# Patient Record
Sex: Female | Born: 1970 | Race: Black or African American | Hispanic: No | Marital: Married | Smoking: Never smoker
Health system: Southern US, Community
[De-identification: ages and names within clinical notes are randomized; demographics above are authoritative.]

## PROBLEM LIST (undated history)

## (undated) ENCOUNTER — Ambulatory Visit (HOSPITAL_COMMUNITY): Admission: EM | Disposition: A | Discharge status: Other Acute Inpatient Hospital | Payer: BC Managed Care – PPO

## (undated) DIAGNOSIS — K589 Irritable bowel syndrome without diarrhea: Secondary | ICD-10-CM

## (undated) DIAGNOSIS — F32A Depression, unspecified: Secondary | ICD-10-CM

## (undated) DIAGNOSIS — F329 Major depressive disorder, single episode, unspecified: Secondary | ICD-10-CM

## (undated) DIAGNOSIS — K760 Fatty (change of) liver, not elsewhere classified: Secondary | ICD-10-CM

## (undated) DIAGNOSIS — K469 Unspecified abdominal hernia without obstruction or gangrene: Secondary | ICD-10-CM

## (undated) DIAGNOSIS — M1990 Unspecified osteoarthritis, unspecified site: Secondary | ICD-10-CM

## (undated) DIAGNOSIS — E7800 Pure hypercholesterolemia, unspecified: Secondary | ICD-10-CM

## (undated) HISTORY — PX: CHOLECYSTECTOMY: SHX55

---

## 2003-10-24 ENCOUNTER — Emergency Department (HOSPITAL_COMMUNITY): Admission: EM | Admit: 2003-10-24 | Discharge: 2003-10-24 | Payer: Self-pay | Admitting: Emergency Medicine

## 2004-12-09 ENCOUNTER — Emergency Department (HOSPITAL_COMMUNITY): Admission: EM | Admit: 2004-12-09 | Discharge: 2004-12-09 | Payer: Self-pay | Admitting: Emergency Medicine

## 2006-08-19 ENCOUNTER — Emergency Department (HOSPITAL_COMMUNITY): Admission: EM | Admit: 2006-08-19 | Discharge: 2006-08-19 | Payer: Self-pay | Admitting: Emergency Medicine

## 2006-08-31 ENCOUNTER — Emergency Department (HOSPITAL_COMMUNITY): Admission: EM | Admit: 2006-08-31 | Discharge: 2006-08-31 | Payer: Self-pay | Admitting: Emergency Medicine

## 2006-09-06 ENCOUNTER — Emergency Department (HOSPITAL_COMMUNITY): Admission: EM | Admit: 2006-09-06 | Discharge: 2006-09-07 | Payer: Self-pay | Admitting: Emergency Medicine

## 2006-09-21 ENCOUNTER — Emergency Department (HOSPITAL_COMMUNITY): Admission: EM | Admit: 2006-09-21 | Discharge: 2006-09-21 | Payer: Self-pay | Admitting: Emergency Medicine

## 2006-11-22 ENCOUNTER — Emergency Department (HOSPITAL_COMMUNITY): Admission: EM | Admit: 2006-11-22 | Discharge: 2006-11-22 | Payer: Self-pay | Admitting: Emergency Medicine

## 2006-11-29 ENCOUNTER — Emergency Department (HOSPITAL_COMMUNITY): Admission: EM | Admit: 2006-11-29 | Discharge: 2006-11-29 | Payer: Self-pay | Admitting: Emergency Medicine

## 2006-12-20 ENCOUNTER — Emergency Department (HOSPITAL_COMMUNITY): Admission: EM | Admit: 2006-12-20 | Discharge: 2006-12-20 | Payer: Self-pay | Admitting: Emergency Medicine

## 2006-12-28 ENCOUNTER — Emergency Department (HOSPITAL_COMMUNITY): Admission: EM | Admit: 2006-12-28 | Discharge: 2006-12-28 | Payer: Self-pay | Admitting: Emergency Medicine

## 2006-12-30 ENCOUNTER — Ambulatory Visit (HOSPITAL_COMMUNITY): Admission: RE | Admit: 2006-12-30 | Discharge: 2006-12-30 | Payer: Self-pay | Admitting: Obstetrics

## 2007-03-07 ENCOUNTER — Emergency Department (HOSPITAL_COMMUNITY): Admission: EM | Admit: 2007-03-07 | Discharge: 2007-03-07 | Payer: Self-pay | Admitting: *Deleted

## 2007-03-20 ENCOUNTER — Emergency Department (HOSPITAL_COMMUNITY): Admission: EM | Admit: 2007-03-20 | Discharge: 2007-03-20 | Payer: Self-pay | Admitting: Emergency Medicine

## 2007-04-21 ENCOUNTER — Emergency Department (HOSPITAL_COMMUNITY): Admission: EM | Admit: 2007-04-21 | Discharge: 2007-04-21 | Payer: Self-pay | Admitting: Emergency Medicine

## 2007-05-13 ENCOUNTER — Emergency Department (HOSPITAL_COMMUNITY): Admission: EM | Admit: 2007-05-13 | Discharge: 2007-05-13 | Payer: Self-pay | Admitting: Emergency Medicine

## 2009-09-19 ENCOUNTER — Emergency Department (HOSPITAL_COMMUNITY): Admission: EM | Admit: 2009-09-19 | Discharge: 2009-09-19 | Payer: Self-pay | Admitting: Emergency Medicine

## 2010-07-14 ENCOUNTER — Emergency Department (HOSPITAL_COMMUNITY): Admission: EM | Admit: 2010-07-14 | Discharge: 2010-07-14 | Payer: Self-pay | Admitting: Emergency Medicine

## 2011-01-06 ENCOUNTER — Emergency Department (HOSPITAL_COMMUNITY)
Admission: EM | Admit: 2011-01-06 | Discharge: 2011-01-06 | Payer: Self-pay | Source: Home / Self Care | Admitting: Emergency Medicine

## 2011-01-07 LAB — URINALYSIS, ROUTINE W REFLEX MICROSCOPIC
Bilirubin Urine: NEGATIVE
Ketones, ur: NEGATIVE mg/dL
Leukocytes, UA: NEGATIVE
Specific Gravity, Urine: 1.014 (ref 1.005–1.030)
Urobilinogen, UA: 0.2 mg/dL (ref 0.0–1.0)
pH: 6.5 (ref 5.0–8.0)

## 2011-01-07 LAB — POCT I-STAT, CHEM 8
BUN: 15 mg/dL (ref 6–23)
Calcium, Ion: 1.23 mmol/L (ref 1.12–1.32)
Creatinine, Ser: 1.1 mg/dL (ref 0.4–1.2)
Glucose, Bld: 169 mg/dL — ABNORMAL HIGH (ref 70–99)
Hemoglobin: 13.9 g/dL (ref 12.0–15.0)
Potassium: 4.5 mEq/L (ref 3.5–5.1)
Sodium: 139 mEq/L (ref 135–145)

## 2011-01-07 LAB — URINE MICROSCOPIC-ADD ON

## 2011-01-27 ENCOUNTER — Ambulatory Visit: Payer: 59 | Attending: Internal Medicine

## 2011-02-03 ENCOUNTER — Emergency Department (HOSPITAL_COMMUNITY)
Admission: EM | Admit: 2011-02-03 | Discharge: 2011-02-04 | Disposition: A | Discharge status: Home / Self Care | Payer: 59 | Attending: Emergency Medicine | Admitting: Emergency Medicine

## 2011-02-03 DIAGNOSIS — R109 Unspecified abdominal pain: Secondary | ICD-10-CM | POA: Insufficient documentation

## 2011-02-03 DIAGNOSIS — E119 Type 2 diabetes mellitus without complications: Secondary | ICD-10-CM | POA: Insufficient documentation

## 2011-02-03 DIAGNOSIS — K5900 Constipation, unspecified: Secondary | ICD-10-CM | POA: Insufficient documentation

## 2011-02-04 ENCOUNTER — Emergency Department (HOSPITAL_COMMUNITY): Payer: 59

## 2011-02-27 LAB — GLUCOSE, CAPILLARY: Glucose-Capillary: 189 mg/dL — ABNORMAL HIGH (ref 70–99)

## 2011-05-24 ENCOUNTER — Emergency Department (HOSPITAL_COMMUNITY)
Admission: EM | Admit: 2011-05-24 | Discharge: 2011-05-24 | Disposition: A | Discharge status: Home / Self Care | Payer: 59 | Attending: Emergency Medicine | Admitting: Emergency Medicine

## 2011-05-24 DIAGNOSIS — R51 Headache: Secondary | ICD-10-CM | POA: Insufficient documentation

## 2011-05-24 DIAGNOSIS — J3489 Other specified disorders of nose and nasal sinuses: Secondary | ICD-10-CM | POA: Insufficient documentation

## 2011-05-24 DIAGNOSIS — E119 Type 2 diabetes mellitus without complications: Secondary | ICD-10-CM | POA: Insufficient documentation

## 2011-05-24 DIAGNOSIS — H9209 Otalgia, unspecified ear: Secondary | ICD-10-CM | POA: Insufficient documentation

## 2011-05-24 DIAGNOSIS — H6690 Otitis media, unspecified, unspecified ear: Secondary | ICD-10-CM | POA: Insufficient documentation

## 2011-05-24 DIAGNOSIS — R070 Pain in throat: Secondary | ICD-10-CM | POA: Insufficient documentation

## 2011-10-26 ENCOUNTER — Encounter: Payer: Self-pay | Admitting: *Deleted

## 2011-10-26 ENCOUNTER — Other Ambulatory Visit: Payer: Self-pay

## 2011-10-26 ENCOUNTER — Emergency Department (HOSPITAL_COMMUNITY)
Admission: EM | Admit: 2011-10-26 | Discharge: 2011-10-26 | Disposition: A | Discharge status: Home / Self Care | Payer: 59 | Attending: Emergency Medicine | Admitting: Emergency Medicine

## 2011-10-26 ENCOUNTER — Emergency Department (HOSPITAL_COMMUNITY): Payer: 59

## 2011-10-26 DIAGNOSIS — R079 Chest pain, unspecified: Secondary | ICD-10-CM | POA: Insufficient documentation

## 2011-10-26 DIAGNOSIS — R1110 Vomiting, unspecified: Secondary | ICD-10-CM | POA: Insufficient documentation

## 2011-10-26 DIAGNOSIS — E119 Type 2 diabetes mellitus without complications: Secondary | ICD-10-CM | POA: Insufficient documentation

## 2011-10-26 LAB — CBC
HCT: 36.4 % (ref 36.0–46.0)
MCH: 28.2 pg (ref 26.0–34.0)
MCHC: 33 g/dL (ref 30.0–36.0)
Platelets: 317 10*3/uL (ref 150–400)
RBC: 4.26 MIL/uL (ref 3.87–5.11)
RDW: 12.3 % (ref 11.5–15.5)
WBC: 6.8 10*3/uL (ref 4.0–10.5)

## 2011-10-26 LAB — PROTIME-INR
INR: 0.95 (ref 0.00–1.49)
Prothrombin Time: 12.9 seconds (ref 11.6–15.2)

## 2011-10-26 LAB — POCT I-STAT, CHEM 8
Calcium, Ion: 1.19 mmol/L (ref 1.12–1.32)
Chloride: 103 mEq/L (ref 96–112)
Creatinine, Ser: 0.7 mg/dL (ref 0.50–1.10)
HCT: 38 % (ref 36.0–46.0)
Hemoglobin: 12.9 g/dL (ref 12.0–15.0)
Potassium: 4.1 mEq/L (ref 3.5–5.1)
TCO2: 25 mmol/L (ref 0–100)

## 2011-10-26 LAB — APTT: aPTT: 24 seconds (ref 24–37)

## 2011-10-26 LAB — POCT I-STAT TROPONIN I: Troponin i, poc: 0 ng/mL (ref 0.00–0.08)

## 2011-10-26 MED ORDER — ASPIRIN 81 MG PO CHEW
324.0000 mg | CHEWABLE_TABLET | Freq: Once | ORAL | Status: AC
  Administered 2011-10-26: 324 mg via ORAL
  Filled 2011-10-26: qty 4

## 2011-10-26 NOTE — ED Provider Notes (Signed)
Medical screening examination/treatment/procedure(s) were performed by non-physician practitioner and as supervising physician I was immediately available for consultation/collaboration.   Suad Autrey A. Jacquelyne Quarry, MD 10/26/11 1007 

## 2011-10-26 NOTE — ED Provider Notes (Signed)
History     CSN: [id number] Arrival date & time: 10/26/2011  6:06 AM   First MD Initiated Contact with Patient 10/26/11 [phone]      Chief Complaint  Patient presents with  . Chest Pain    (Consider location/radiation/quality/duration/timing/severity/associated sxs/prior treatment) Patient is a 40 y.o. female presenting with chest pain. The history is provided by the patient.  Chest Pain The chest pain began 2 days ago. Episode Length: The discomfort is constant with waxing/waning fluxuations starting last night. The chest pain is unchanged. The quality of the pain is described as aching. The pain does not radiate. Primary symptoms include vomiting. Pertinent negatives for primary symptoms include no fever and no shortness of breath.  Pertinent negatives for associated symptoms include no lower extremity edema. She tried nothing for the symptoms. Past medical history comments: Patient reports diet controlled diabetes.  Her family medical history is significant for stroke in family.  Pertinent negatives for family medical history include: no CAD in family and no early MI in family.     Past Medical History  Diagnosis Date  . Diabetes mellitus     Past Surgical History  Procedure Date  . Cesarean section   . Cholecystectomy     Family History  Problem Relation Age of Onset  . Hypertension Mother   . Heart failure Mother     History  Substance Use Topics  . Smoking status: Never Smoker   . Smokeless tobacco: Not on file  . Alcohol Use: No    OB History    Grav Para Term Preterm Abortions TAB SAB Ect Mult Living                  Review of Systems  Constitutional: Negative for fever and chills.  HENT: Negative.   Respiratory: Negative.  Negative for shortness of breath.   Cardiovascular: Positive for chest pain.  Gastrointestinal: Positive for vomiting.  Musculoskeletal: Negative.   Skin: Negative.   Neurological: Negative.     Allergies  Penicillins  Home  Medications   Current Outpatient Rx  Name Route Sig Dispense Refill  . IBUPROFEN 200 MG PO TABS Oral Take 800 mg by mouth every 8 (eight) hours as needed. For pain       BP 120/80  Pulse 75  Temp(Src) 97.8 F (36.6 C) (Oral)  Resp 18  SpO2 96%  LMP 10/17/2011  Physical Exam  Constitutional: She appears well-developed and well-nourished.  HENT:  Head: Normocephalic.  Neck: Normal range of motion. Neck supple.  Cardiovascular: Normal rate and regular rhythm.   Pulmonary/Chest: Effort normal and breath sounds normal.  Abdominal: Soft. Bowel sounds are normal. There is no tenderness. There is no rebound and no guarding.  Musculoskeletal: Normal range of motion. She exhibits no edema.  Neurological: She is alert. No cranial nerve deficit.  Skin: Skin is warm and dry. No rash noted.  Psychiatric: She has a normal mood and affect.    ED Course  Procedures (including critical care time)  Labs Reviewed  POCT I-STAT, CHEM 8 - Abnormal; Notable for the following:    Glucose, Bld 161 (*)    All other components within normal limits  CBC  PROTIME-INR  APTT  POCT I-STAT TROPONIN I  I-STAT, CHEM 8  I-STAT TROPONIN I   Dg Chest 2 View  10/26/2011  *RADIOLOGY REPORT*  Clinical Data: Chest pain.  CHEST - 2 VIEW  Comparison: PA and lateral chest 12/09/2004.  Findings: Lungs are clear.  Lung  volumes are low.  No pneumothorax or pleural effusion.  Heart size normal.  IMPRESSION: No acute disease.  Original Report Authenticated By: Bernadene Bell. Maricela Curet, M.D.     No diagnosis found.    MDM   Date: 10/26/2011  Rate: 84  Rhythm: normal sinus rhythm  QRS Axis: normal  Intervals: normal  ST/T Wave abnormalities: normal  Conduction Disutrbances:none  Narrative Interpretation:   Old EKG Reviewed: none available          Rodena Medin, PA 10/26/11 [phone]

## 2011-10-26 NOTE — ED Provider Notes (Signed)
History     CSN: [id number] Arrival date & time: 10/26/2011  6:06 AM   First MD Initiated Contact with Patient 10/26/11 [phone]      Chief Complaint  Patient presents with  . Chest Pain    (Consider location/radiation/quality/duration/timing/severity/associated sxs/prior treatment) HPI  Past Medical History  Diagnosis Date  . Diabetes mellitus     Past Surgical History  Procedure Date  . Cesarean section   . Cholecystectomy     Family History  Problem Relation Age of Onset  . Hypertension Mother   . Heart failure Mother     History  Substance Use Topics  . Smoking status: Never Smoker   . Smokeless tobacco: Not on file  . Alcohol Use: No    OB History    Grav Para Term Preterm Abortions TAB SAB Ect Mult Living                  Review of Systems  Allergies  Penicillins  Home Medications   Current Outpatient Rx  Name Route Sig Dispense Refill  . IBUPROFEN 200 MG PO TABS Oral Take 800 mg by mouth every 8 (eight) hours as needed. For pain       BP 120/80  Pulse 75  Temp(Src) 97.8 F (36.6 C) (Oral)  Resp 18  SpO2 96%  LMP 10/17/2011  Physical Exam  ED Course  Procedures (including critical care time)  Labs Reviewed  POCT I-STAT, CHEM 8 - Abnormal; Notable for the following:    Glucose, Bld 161 (*)    All other components within normal limits  CBC  PROTIME-INR  APTT  POCT I-STAT TROPONIN I  I-STAT, CHEM 8  I-STAT TROPONIN I   Dg Chest 2 View  10/26/2011  *RADIOLOGY REPORT*  Clinical Data: Chest pain.  CHEST - 2 VIEW  Comparison: PA and lateral chest 12/09/2004.  Findings: Lungs are clear.  Lung volumes are low.  No pneumothorax or pleural effusion.  Heart size normal.  IMPRESSION: No acute disease.  Original Report Authenticated By: Bernadene Bell. D'ALESSIO, M.D.     1. Chest pain       MDM  Medical screening examination/treatment/procedure(s) were performed by non-physician practitioner and as supervising physician I was immediately  available for consultation/collaboration.         Tomeka Kantner A. Patrica Duel, MD 10/26/11 [phone]

## 2011-10-26 NOTE — ED Notes (Signed)
Woke around 0330, went to b/r, felt light headed, L neck felt tight also CP, develoed nv around 1 hr ago.

## 2012-05-23 ENCOUNTER — Other Ambulatory Visit: Payer: Self-pay

## 2012-05-23 ENCOUNTER — Encounter (HOSPITAL_COMMUNITY): Payer: Self-pay | Admitting: *Deleted

## 2012-05-23 ENCOUNTER — Emergency Department (HOSPITAL_COMMUNITY): Payer: 59

## 2012-05-23 ENCOUNTER — Emergency Department (HOSPITAL_COMMUNITY)
Admission: EM | Admit: 2012-05-23 | Discharge: 2012-05-23 | Discharge status: Absent without leave | Payer: 59 | Source: Home / Self Care

## 2012-05-23 ENCOUNTER — Emergency Department (HOSPITAL_COMMUNITY)
Admission: EM | Admit: 2012-05-23 | Discharge: 2012-05-23 | Disposition: A | Discharge status: Home / Self Care | Payer: 59 | Attending: Emergency Medicine | Admitting: Emergency Medicine

## 2012-05-23 DIAGNOSIS — R079 Chest pain, unspecified: Secondary | ICD-10-CM | POA: Insufficient documentation

## 2012-05-23 DIAGNOSIS — R110 Nausea: Secondary | ICD-10-CM | POA: Insufficient documentation

## 2012-05-23 DIAGNOSIS — E119 Type 2 diabetes mellitus without complications: Secondary | ICD-10-CM | POA: Insufficient documentation

## 2012-05-23 DIAGNOSIS — F3289 Other specified depressive episodes: Secondary | ICD-10-CM | POA: Insufficient documentation

## 2012-05-23 DIAGNOSIS — F329 Major depressive disorder, single episode, unspecified: Secondary | ICD-10-CM | POA: Insufficient documentation

## 2012-05-23 DIAGNOSIS — R55 Syncope and collapse: Secondary | ICD-10-CM | POA: Insufficient documentation

## 2012-05-23 HISTORY — DX: Depression, unspecified: F32.A

## 2012-05-23 HISTORY — DX: Major depressive disorder, single episode, unspecified: F32.9

## 2012-05-23 LAB — CBC
MCV: 85.2 fL (ref 78.0–100.0)
Platelets: 341 10*3/uL (ref 150–400)
RBC: 4.8 MIL/uL (ref 3.87–5.11)
RDW: 12.7 % (ref 11.5–15.5)
WBC: 7.9 10*3/uL (ref 4.0–10.5)

## 2012-05-23 LAB — BASIC METABOLIC PANEL
CO2: 22 mEq/L (ref 19–32)
Calcium: 9.9 mg/dL (ref 8.4–10.5)
GFR calc non Af Amer: >90 mL/min (ref >90)
Glucose, Bld: 160 mg/dL — ABNORMAL HIGH (ref 70–99)
Potassium: 4 mEq/L (ref 3.5–5.1)
Sodium: 135 mEq/L (ref 135–145)

## 2012-05-23 LAB — DIFFERENTIAL
Basophils Absolute: 0 10*3/uL (ref 0.0–0.1)
Eosinophils Relative: 3 % (ref 0–5)
Lymphocytes Relative: 26 % (ref 12–46)
Lymphs Abs: 2.1 10*3/uL (ref 0.7–4.0)
Neutro Abs: 5.1 10*3/uL (ref 1.7–7.7)
Neutrophils Relative %: 64 % (ref 43–77)

## 2012-05-23 LAB — URINALYSIS, ROUTINE W REFLEX MICROSCOPIC
Glucose, UA: NEGATIVE mg/dL
Leukocytes, UA: NEGATIVE
Protein, ur: NEGATIVE mg/dL
Specific Gravity, Urine: 1.026 (ref 1.005–1.030)
Urobilinogen, UA: 0.2 mg/dL (ref 0.0–1.0)

## 2012-05-23 LAB — POCT I-STAT TROPONIN I: Troponin i, poc: 0 ng/mL (ref 0.00–0.08)

## 2012-05-23 LAB — URINE MICROSCOPIC-ADD ON

## 2012-05-23 LAB — D-DIMER, QUANTITATIVE: D-Dimer, Quant: <0.22 ug/mL-FEU (ref 0.00–0.48)

## 2012-05-23 MED ORDER — ASPIRIN 81 MG PO CHEW
324.0000 mg | CHEWABLE_TABLET | Freq: Once | ORAL | Status: AC
  Administered 2012-05-23: 324 mg via ORAL
  Filled 2012-05-23: qty 4

## 2012-05-23 NOTE — ED Notes (Signed)
Notified nurse of blood pressure

## 2012-05-23 NOTE — ED Notes (Signed)
Pt stated that she did not have to urinate at this time. 

## 2012-05-23 NOTE — Discharge Instructions (Signed)
Ms McCanies-Deshotels the EKG and labs for your heart show that you did not have a heart attack.  The chest x-ray and and D dimer show no pneumonia or blood clot.  We also did a cbc and cmet that shows your blood HGB and electrolytes are normal.  Follow up with Dr. Valentina Lucks tomorrow.  Return to the ER for severe chest pain, SOB or other concerns. Take an aspirin a day and zantac or pepcid to prevent this from coming back.

## 2012-05-23 NOTE — ED Provider Notes (Signed)
History     CSN: [id number]  Arrival date & time 05/23/12  1214   First MD Initiated Contact with Patient 05/23/12 1317      Chief Complaint  Patient presents with  . Chest Pain    (Consider location/radiation/quality/duration/timing/severity/associated sxs/prior treatment) Patient is a 41 y.o. female presenting with chest pain. The history is provided by the patient and the spouse. No language interpreter was used.  Chest Pain The chest pain began 12 - 24 hours ago. Duration of episode(s) is 2 minutes. Chest pain occurs intermittently. At its most intense, the pain is at 7/10. The pain is currently at 4/10. The severity of the pain is mild. The quality of the pain is described as pressure-like and heavy. The pain does not radiate. Primary symptoms include nausea. Pertinent negatives for primary symptoms include no fever, no fatigue, no syncope, no shortness of breath, no cough, no wheezing, no abdominal pain, no vomiting and no dizziness.  Associated symptoms include near-syncope.  Pertinent negatives for associated symptoms include no diaphoresis, no lower extremity edema, no numbness and no weakness. She tried nothing for the symptoms. Risk factors include lack of exercise, obesity and stress.  Her past medical history is significant for anxiety/panic attacks and thyroid problem.  Pertinent negatives for past medical history include no aneurysm, no aortic aneurysm, no aortic dissection, no arrhythmia, no cancer, no COPD, no CHF, no diabetes, no DVT, no hyperlipidemia, no hypertension, no MI, no mitral valve prolapse, no PE, no seizures, no sleep apnea and no strokes.  Her family medical history is significant for CAD in family, diabetes in family, heart disease in family, hyperlipidemia in family, hypertension in family, early MI in family, PE in family and PVD in family.  Pertinent negatives for family medical history include: no sickle cell disease in family, no stroke in family and no  sudden death in family.  Procedure history is positive for exercise treadmill test. Procedure history comments: at [facility] cardiology.   41yo female c/o chest tightness x 5 that started around 12 hours ago that lasted minutes at a time. Sharp fleeting L chest pain x 1 yesterday.   Also felt like she was going to pass out "a jolt feeling".  States that she had an episode of diarrhea nausea as well. States that her fingers and toes itch intermittantly.  Denies SOB, fever, diaphoresis, calf pain or long trip.  States that the tightness was worse laying on her side.  Was here a year ago for the same.  Negative stress test at [facility] cardiology.     Past Medical History  Diagnosis Date  . Diabetes mellitus   . Depression     Past Surgical History  Procedure Date  . Cesarean section   . Cholecystectomy     Family History  Problem Relation Age of Onset  . Hypertension Mother   . Heart failure Mother     History  Substance Use Topics  . Smoking status: Never Smoker   . Smokeless tobacco: Not on file  . Alcohol Use: No    OB History    Grav Para Term Preterm Abortions TAB SAB Ect Mult Living                  Review of Systems  Constitutional: Negative for fever, diaphoresis and fatigue.  Respiratory: Negative for cough, shortness of breath and wheezing.   Cardiovascular: Positive for chest pain and near-syncope. Negative for syncope.  Gastrointestinal: Positive for nausea. Negative for vomiting  and abdominal pain.  Neurological: Negative for dizziness, seizures, weakness and numbness.    Allergies  Penicillins  Home Medications   Current Outpatient Rx  Name Route Sig Dispense Refill  . IBUPROFEN 200 MG PO TABS Oral Take 400-800 mg by mouth every 8 (eight) hours as needed. For pain      BP 127/73  Pulse 97  Temp(Src) 98.1 F (36.7 C) (Oral)  Resp 16  Wt 315 lb (142.883 kg)  SpO2 97%  LMP 05/02/2012  Physical Exam  Nursing note and vitals reviewed. Constitutional:  She is oriented to person, place, and time. She appears well-developed and well-nourished.       Morbid obese  HENT:  Head: Normocephalic and atraumatic.  Eyes: Conjunctivae and EOM are normal. Pupils are equal, round, and reactive to light.  Neck: Normal range of motion. Neck supple.  Cardiovascular: Normal rate.   Pulmonary/Chest: Effort normal and breath sounds normal. No respiratory distress. She has no wheezes.  Abdominal: Soft. Bowel sounds are normal. She exhibits no distension. There is no tenderness. There is no rebound and no guarding.  Musculoskeletal: Normal range of motion. She exhibits no edema and no tenderness.  Neurological: She is alert and oriented to person, place, and time. She has normal reflexes.  Skin: Skin is warm and dry.  Psychiatric: She has a normal mood and affect.    ED Course  Procedures (including critical care time)   Labs Reviewed  BASIC METABOLIC PANEL  URINALYSIS, ROUTINE W REFLEX MICROSCOPIC   No results found.   No diagnosis found.    MDM   Date: 05/23/2012  Rate: 95  Rhythm: normal sinus rhythm  QRS Axis: normal  Intervals: normal  ST/T Wave abnormalities: normal  Conduction Disutrbances:none  Narrative Interpretation:   Old EKG Reviewed: none available  Chest tightness with multiple other complaints.  Negative stress test with [facility] cardiology last year.  Pain free in the ER.  Feels better wants to go home.  EKG unremarkable.  Troponin negative.  D dimer negative.  CBC and B met unremarkable.  Chest x-ray unremarkable.  Will follow up [facility] cardiology this week.  Return if worse.  Blood in urine.   Labs Reviewed  BASIC METABOLIC PANEL - Abnormal; Notable for the following:    Glucose, Bld 160 (*)    All other components within normal limits  URINALYSIS, ROUTINE W REFLEX MICROSCOPIC - Abnormal; Notable for the following:    Hgb urine dipstick SMALL (*)    All other components within normal limits  URINE MICROSCOPIC-ADD ON  - Abnormal; Notable for the following:    Squamous Epithelial / LPF FEW (*)    Bacteria, UA FEW (*)    All other components within normal limits  CBC  DIFFERENTIAL  D-DIMER, QUANTITATIVE  POCT I-STAT TROPONIN I  LAB REPORT - SCANNED          Remi Haggard, NP 05/24/12 1113

## 2012-05-23 NOTE — ED Notes (Signed)
MD at bedside when ekg was attempted

## 2012-05-23 NOTE — ED Notes (Addendum)
Pt states "it started when I was in the bed last night, it felt like a jolt and like I was getting ready to black out, I felt nauseated, had diarrhea, felt a fluttery/quivery feeling"

## 2012-05-24 NOTE — ED Provider Notes (Signed)
Medical screening examination/treatment/procedure(s) were performed by non-physician practitioner and as supervising physician I was immediately available for consultation/collaboration. I have seen the ECG, and agree with the interpretation.   Gerhard Munch, MD 05/24/12 1215

## 2013-07-25 ENCOUNTER — Encounter: Payer: 59 | Attending: Physician Assistant | Admitting: *Deleted

## 2013-07-25 VITALS — Ht 72.0 in | Wt 303.6 lb

## 2013-07-25 DIAGNOSIS — Z713 Dietary counseling and surveillance: Secondary | ICD-10-CM | POA: Insufficient documentation

## 2013-07-25 DIAGNOSIS — E119 Type 2 diabetes mellitus without complications: Secondary | ICD-10-CM | POA: Insufficient documentation

## 2013-08-02 ENCOUNTER — Encounter: Payer: Self-pay | Admitting: *Deleted

## 2013-08-02 NOTE — Progress Notes (Signed)
Patient was seen on 07/25/2013 for the first of a series of three diabetes self-management courses at the [facility].   Current HbA1c: 8.9%  The following learning objectives were met by the patient during this course:   Defines the role of glucose and insulin  Identifies type of diabetes and pathophysiology  Defines the diagnostic criteria for diabetes and prediabetes  States the risk factors for Type 2 Diabetes  States the symptoms of Type 2 Diabetes  Defines Type 2 Diabetes treatment goals  Defines Type 2 Diabetes treatment options  States the rationale for glucose monitoring  Identifies A1C, glucose targets, and testing times  Identifies proper sharps disposal  Defines the purpose of a diabetes food plan  Identifies carbohydrate food groups  Defines effects of carbohydrate foods on glucose levels  Identifies carbohydrate choices/grams/food labels  States benefits of physical activity and effect on glucose  Review of suggested activity guidelines  Handouts given during class include:  Type 2 Diabetes: Basics Book  My Food Plan Book  Food and Activity Log  Your patient has identified their diabetes self-care support plan as:  [facility] support group  Continued education   Follow-Up Plan: Attend core 2 and core 3

## 2013-08-02 NOTE — Patient Instructions (Addendum)
Goals:  Follow Diabetes Meal Plan as instructed  Eat 3 meals and 2 snacks, every 3-5 hrs  Limit carbohydrate intake to 60 grams carbohydrate/meal  Limit carbohydrate intake to 30 grams carbohydrate/snack  Add lean protein foods to meals/snacks  Monitor glucose levels as instructed by your doctor  Aim for 15-30 mins of physical activity daily  Bring food record and glucose log to your next nutrition visit

## 2013-08-15 ENCOUNTER — Ambulatory Visit: Payer: 59

## 2013-08-22 ENCOUNTER — Ambulatory Visit: Payer: Self-pay | Admitting: Obstetrics

## 2013-08-29 ENCOUNTER — Ambulatory Visit: Payer: 59

## 2013-09-26 ENCOUNTER — Encounter: Payer: 59 | Attending: Physician Assistant

## 2013-09-26 DIAGNOSIS — Z713 Dietary counseling and surveillance: Secondary | ICD-10-CM | POA: Insufficient documentation

## 2013-09-26 DIAGNOSIS — E119 Type 2 diabetes mellitus without complications: Secondary | ICD-10-CM | POA: Insufficient documentation

## 2013-10-11 ENCOUNTER — Ambulatory Visit: Payer: 59

## 2013-10-19 ENCOUNTER — Ambulatory Visit: Payer: 59

## 2014-06-05 ENCOUNTER — Emergency Department (HOSPITAL_COMMUNITY)
Admission: EM | Admit: 2014-06-05 | Discharge: 2014-06-06 | Disposition: A | Discharge status: Home / Self Care | Payer: 59 | Attending: Emergency Medicine | Admitting: Emergency Medicine

## 2014-06-05 DIAGNOSIS — Z8659 Personal history of other mental and behavioral disorders: Secondary | ICD-10-CM | POA: Insufficient documentation

## 2014-06-05 DIAGNOSIS — Z79899 Other long term (current) drug therapy: Secondary | ICD-10-CM | POA: Insufficient documentation

## 2014-06-05 DIAGNOSIS — Z3202 Encounter for pregnancy test, result negative: Secondary | ICD-10-CM | POA: Insufficient documentation

## 2014-06-05 DIAGNOSIS — Z7982 Long term (current) use of aspirin: Secondary | ICD-10-CM | POA: Insufficient documentation

## 2014-06-05 DIAGNOSIS — K589 Irritable bowel syndrome without diarrhea: Secondary | ICD-10-CM | POA: Insufficient documentation

## 2014-06-05 DIAGNOSIS — Z9889 Other specified postprocedural states: Secondary | ICD-10-CM | POA: Insufficient documentation

## 2014-06-05 DIAGNOSIS — Z880 Allergy status to penicillin: Secondary | ICD-10-CM | POA: Insufficient documentation

## 2014-06-05 DIAGNOSIS — Z9089 Acquired absence of other organs: Secondary | ICD-10-CM | POA: Insufficient documentation

## 2014-06-05 DIAGNOSIS — E119 Type 2 diabetes mellitus without complications: Secondary | ICD-10-CM | POA: Insufficient documentation

## 2014-06-05 DIAGNOSIS — R109 Unspecified abdominal pain: Secondary | ICD-10-CM | POA: Insufficient documentation

## 2014-06-05 DIAGNOSIS — R1030 Lower abdominal pain, unspecified: Secondary | ICD-10-CM

## 2014-06-05 DIAGNOSIS — E785 Hyperlipidemia, unspecified: Secondary | ICD-10-CM | POA: Insufficient documentation

## 2014-06-05 HISTORY — DX: Irritable bowel syndrome, unspecified: K58.9

## 2014-06-05 HISTORY — DX: Unspecified abdominal hernia without obstruction or gangrene: K46.9

## 2014-06-05 HISTORY — DX: Pure hypercholesterolemia, unspecified: E78.00

## 2014-06-05 HISTORY — DX: Fatty (change of) liver, not elsewhere classified: K76.0

## 2014-06-06 ENCOUNTER — Encounter (HOSPITAL_COMMUNITY): Payer: Self-pay | Admitting: Emergency Medicine

## 2014-06-06 ENCOUNTER — Emergency Department (HOSPITAL_COMMUNITY): Payer: 59

## 2014-06-06 LAB — CBC WITH DIFFERENTIAL/PLATELET
BASOS PCT: 0 % (ref 0–1)
Basophils Absolute: 0 10*3/uL (ref 0.0–0.1)
Eosinophils Absolute: 0.2 10*3/uL (ref 0.0–0.7)
Eosinophils Relative: 3 % (ref 0–5)
HCT: 38.3 % (ref 36.0–46.0)
HEMOGLOBIN: 12.6 g/dL (ref 12.0–15.0)
LYMPHS ABS: 2 10*3/uL (ref 0.7–4.0)
Lymphocytes Relative: 27 % (ref 12–46)
MCH: 27.9 pg (ref 26.0–34.0)
MCHC: 32.9 g/dL (ref 30.0–36.0)
MCV: 84.9 fL (ref 78.0–100.0)
MONOS PCT: 6 % (ref 3–12)
Monocytes Absolute: 0.4 10*3/uL (ref 0.1–1.0)
NEUTROS ABS: 5 10*3/uL (ref 1.7–7.7)
NEUTROS PCT: 64 % (ref 43–77)
Platelets: 348 10*3/uL (ref 150–400)
RBC: 4.51 MIL/uL (ref 3.87–5.11)
RDW: 12.5 % (ref 11.5–15.5)
WBC: 7.7 10*3/uL (ref 4.0–10.5)

## 2014-06-06 LAB — URINALYSIS, ROUTINE W REFLEX MICROSCOPIC
Bilirubin Urine: NEGATIVE
Glucose, UA: 250 mg/dL — AB
Ketones, ur: 40 mg/dL — AB
LEUKOCYTES UA: NEGATIVE
Nitrite: NEGATIVE
PH: 6 (ref 5.0–8.0)
Protein, ur: NEGATIVE mg/dL
SPECIFIC GRAVITY, URINE: 1.03 (ref 1.005–1.030)
Urobilinogen, UA: 0.2 mg/dL (ref 0.0–1.0)

## 2014-06-06 LAB — URINE MICROSCOPIC-ADD ON

## 2014-06-06 LAB — COMPREHENSIVE METABOLIC PANEL
ALBUMIN: 3.5 g/dL (ref 3.5–5.2)
ALK PHOS: 66 U/L (ref 39–117)
ALT: 18 U/L (ref 0–35)
AST: 27 U/L (ref 0–37)
BILIRUBIN TOTAL: 0.4 mg/dL (ref 0.3–1.2)
BUN: 11 mg/dL (ref 6–23)
CHLORIDE: 94 meq/L — AB (ref 96–112)
CO2: 20 mEq/L (ref 19–32)
Calcium: 9.7 mg/dL (ref 8.4–10.5)
Creatinine, Ser: 0.61 mg/dL (ref 0.50–1.10)
GFR calc Af Amer: >90 mL/min (ref >90)
GFR calc non Af Amer: >90 mL/min (ref >90)
GLUCOSE: 278 mg/dL — AB (ref 70–99)
POTASSIUM: 4.8 meq/L (ref 3.7–5.3)
Sodium: 134 mEq/L — ABNORMAL LOW (ref 137–147)
Total Protein: 6.8 g/dL (ref 6.0–8.3)

## 2014-06-06 LAB — LIPASE, BLOOD: Lipase: 36 U/L (ref 11–59)

## 2014-06-06 LAB — POC URINE PREG, ED: Preg Test, Ur: NEGATIVE

## 2014-06-06 MED ORDER — DICYCLOMINE HCL 20 MG PO TABS: 20.0000 mg | ORAL_TABLET | Freq: Four times a day (QID) | ORAL | Status: DC | PRN

## 2014-06-06 MED ORDER — DICYCLOMINE HCL 20 MG PO TABS
20.0000 mg | ORAL_TABLET | Freq: Once | ORAL | Status: AC
  Administered 2014-06-06: 20 mg via ORAL
  Filled 2014-06-06: qty 1

## 2014-06-06 NOTE — ED Provider Notes (Signed)
CSN: [id number]     Arrival date & time 06/05/14  2308 History   First MD Initiated Contact with Patient 06/06/14 0214     Chief Complaint  Patient presents with  . Abdominal Pain     (Consider location/radiation/quality/duration/timing/severity/associated sxs/prior Treatment) HPI 43 year old female presents to emergency apartment with complaint of lower abdominal pain.  Patient is reported historian, cannot quantify the pain.  She reports she's had similar pain before, but cannot recollect specific symptoms or inciting events.  She has never seen a doctor for this before.  She reports she was told after having a CAT scan several years ago that she had 2 hernias.  She does not report where the hernias were located or what type they were.  Patient reports she's been referred to gastroenterology in the past, but has not followed up.  Current pain has been ongoing for about 2 days.  She reports that she was constipated last week, but has had normal bowel movements since that time.  She has occasional nausea but no vomiting.  She denies any urinary symptoms.  Patient's daughter was going to the emergency room for abdominal pain, so she decided to get seen as well.  She does not have a regular doctor, is only seen by urgent care as needed. Past Medical History  Diagnosis Date  . Diabetes mellitus   . Depression   . Hernia, abdominal   . Fatty liver   . Hypercholesteremia   . IBS (irritable bowel syndrome)    Past Surgical History  Procedure Laterality Date  . Cesarean section    . Cholecystectomy     Family History  Problem Relation Age of Onset  . Hypertension Mother   . Heart failure Mother    History  Substance Use Topics  . Smoking status: Never Smoker   . Smokeless tobacco: Not on file  . Alcohol Use: No   OB History   Grav Para Term Preterm Abortions TAB SAB Ect Mult Living                 Review of Systems  See History of Present Illness; otherwise all other systems are  reviewed and negative   Allergies  Penicillins  Home Medications   Prior to Admission medications   Medication Sig Start Date End Date Taking? Authorizing Kenzlei Runions  aspirin 81 MG tablet Take 81 mg by mouth at bedtime.    Yes Historical Lashauna Arpin, MD  atorvastatin (LIPITOR) 20 MG tablet Take 20 mg by mouth at bedtime.    Yes Historical Xabi Wittler, MD  metFORMIN (GLUCOPHAGE-XR) 500 MG 24 hr tablet Take 500 mg by mouth daily with breakfast.   Yes Historical Blayne Frankie, MD  naproxen sodium (ANAPROX) 220 MG tablet Take 440 mg by mouth 2 (two) times daily as needed (pain).   Yes Historical Breonna Gafford, MD  Vitamin D, Ergocalciferol, (DRISDOL) 50000 UNITS CAPS capsule Take 50,000 Units by mouth every 7 (seven) days.   Yes Historical Deuntae Kocsis, MD  dicyclomine (BENTYL) 20 MG tablet Take 1 tablet (20 mg total) by mouth every 6 (six) hours as needed for spasms (for abdominal cramping). 06/06/14   Olivia Mackielga M Otter, MD  niacin 500 MG CR capsule Take 500 mg by mouth at bedtime.    Historical Yuridiana Formanek, MD   BP 149/87  Pulse 102  Temp(Src) 98.3 F (36.8 C) (Oral)  Resp 18  SpO2 93%  LMP 05/24/2014 Physical Exam  Nursing note and vitals reviewed. Constitutional: She is oriented to person, place, and  time. She appears well-developed and well-nourished.  Patient is very frustrated with her ED visit  HENT:  Head: Normocephalic and atraumatic.  Nose: Nose normal.  Mouth/Throat: Oropharynx is clear and moist.  Eyes: Conjunctivae and EOM are normal. Pupils are equal, round, and reactive to light.  Neck: Normal range of motion. Neck supple. No JVD present. No tracheal deviation present. No thyromegaly present.  Cardiovascular: Normal rate, regular rhythm, normal heart sounds and intact distal pulses.  Exam reveals no gallop and no friction rub.   No murmur heard. Pulmonary/Chest: Effort normal and breath sounds normal. No stridor. No respiratory distress. She has no wheezes. She has no rales. She exhibits no  tenderness.  Abdominal: Soft. Bowel sounds are normal. She exhibits no distension and no mass. There is tenderness (mild diffuse lower abdominal pain over suprapubic region mainly). There is no rebound and no guarding.  Musculoskeletal: Normal range of motion. She exhibits no edema and no tenderness.  Lymphadenopathy:    She has no cervical adenopathy.  Neurological: She is alert and oriented to person, place, and time. She exhibits normal muscle tone. Coordination normal.  Skin: Skin is warm and dry. No rash noted. No erythema. No pallor.  Psychiatric: She has a normal mood and affect. Her behavior is normal. Judgment and thought content normal.    ED Course  Procedures (including critical care time) Labs Review Labs Reviewed  COMPREHENSIVE METABOLIC PANEL - Abnormal; Notable for the following:    Sodium 134 (*)    Chloride 94 (*)    Glucose, Bld 278 (*)    All other components within normal limits  URINALYSIS, ROUTINE W REFLEX MICROSCOPIC - Abnormal; Notable for the following:    Glucose, UA 250 (*)    Hgb urine dipstick SMALL (*)    Ketones, ur 40 (*)    All other components within normal limits  URINE MICROSCOPIC-ADD ON - Abnormal; Notable for the following:    Bacteria, UA FEW (*)    All other components within normal limits  CBC WITH DIFFERENTIAL  LIPASE, BLOOD  POC URINE PREG, ED    Imaging Review Dg Abd 1 View  06/06/2014   CLINICAL DATA:  Lower abdominal pain and nausea for 4 days.  EXAM: ABDOMEN - 1 VIEW  COMPARISON:  02/04/2011  FINDINGS: Surgical clips in the right upper quadrant. The bowel gas pattern is normal. No radio-opaque calculi or other significant radiographic abnormality are seen.  IMPRESSION: Negative.   Electronically Signed   By: Burman NievesWilliam  Stevens M.D.   On: 06/06/2014 03:19     EKG Interpretation None      MDM   Final diagnoses:  Lower abdominal pain    43 year old female with abdominal pain.  Workup here unremarkable.  Patient not willing to  stay for CT scan.  I do not feel that she needs one him emergently given her mild symptoms and normal labs.  Patient offered prescription for Bentyl and followup with gastroenterology.  I feel the patient may have IBS, and has been given handout for same.   Olivia Mackielga M Otter, MD 06/06/14 [phone]

## 2014-06-06 NOTE — ED Notes (Signed)
Per pt report: pt from home: pt has two hernias. Pt c/o of lower abd pain that began about 2 days ago. Pt reports having issues with constipation occasionally. Pt reports nausea but not vomiting.  Pt a/o x 4. Pt ambulatory.

## 2014-06-06 NOTE — Discharge Instructions (Signed)
Your labs and xray today were normal.  No specific cause for your symptoms were found.  By your history, you may have Irritable Bowel Syndrome.  It is recommended to follow up with a primary care doctor or GI specialist for further workup as an outpatient.  Return to the ER for worsening condition or new concerning symptoms.   Abdominal Pain Many things can cause abdominal pain. Usually, abdominal pain is not caused by a disease and will improve without treatment. It can often be observed and treated at home. Your health care provider will do a physical exam and possibly order blood tests and X-rays to help determine the seriousness of your pain. However, in many cases, more time must pass before a clear cause of the pain can be found. Before that point, your health care provider may not know if you need more testing or further treatment. HOME CARE INSTRUCTIONS  Monitor your abdominal pain for any changes. The following actions may help to alleviate any discomfort you are experiencing:  Only take over-the-counter or prescription medicines as directed by your health care provider.  Do not take laxatives unless directed to do so by your health care provider.  Try a clear liquid diet (broth, tea, or water) as directed by your health care provider. Slowly move to a bland diet as tolerated. SEEK MEDICAL CARE IF:  You have unexplained abdominal pain.  You have abdominal pain associated with nausea or diarrhea.  You have pain when you urinate or have a bowel movement.  You experience abdominal pain that wakes you in the night.  You have abdominal pain that is worsened or improved by eating food.  You have abdominal pain that is worsened with eating fatty foods.  You have a fever. SEEK IMMEDIATE MEDICAL CARE IF:   Your pain does not go away within 2 hours.  You keep throwing up (vomiting).  Your pain is felt only in portions of the abdomen, such as the right side or the left lower portion  of the abdomen.  You pass bloody or black tarry stools. MAKE SURE YOU:  Understand these instructions.   Will watch your condition.   Will get help right away if you are not doing well or get worse.  Document Released: 09/09/2005 Document Revised: 12/05/2013 Document Reviewed: 08/09/2013 [facility] Patient Information 2015 [place], [place]. This information is not intended to replace advice given to you by your health care provider. Make sure you discuss any questions you have with your health care provider.  Pain of Unknown Etiology (Pain Without a Known Cause) You have come to your caregiver because of pain. Pain can occur in any part of the body. Often there is not a definite cause. If your laboratory (blood or urine) work was normal and X-rays or other studies were normal, your caregiver may treat you without knowing the cause of the pain. An example of this is the headache. Most headaches are diagnosed by taking a history. This means your caregiver asks you questions about your headaches. Your caregiver determines a treatment based on your answers. Usually testing done for headaches is normal. Often testing is not done unless there is no response to medications. Regardless of where your pain is located today, you can be given medications to make you comfortable. If no physical cause of pain can be found, most cases of pain will gradually leave as suddenly as they came.  If you have a painful condition and no reason can be found for the  pain, it is important that you follow up with your caregiver. If the pain becomes worse or does not go away, it may be necessary to repeat tests and look further for a possible cause.  Only take over-the-counter or prescription medicines for pain, discomfort, or fever as directed by your caregiver.  For the protection of your privacy, test results cannot be given over the phone. Make sure you receive the results of your test. Ask how these results are to be  obtained if you have not been informed. It is your responsibility to obtain your test results.  You may continue all activities unless the activities cause more pain. When the pain lessens, it is important to gradually resume normal activities. Resume activities by beginning slowly and gradually increasing the intensity and duration of the activities or exercise. During periods of severe pain, bed rest may be helpful. Lie or sit in any position that is comfortable.  Ice used for acute (sudden) conditions may be effective. Use a large plastic bag filled with ice and wrapped in a towel. This may provide pain relief.  See your caregiver for continued problems. Your caregiver can help or refer you for exercises or physical therapy if necessary. If you were given medications for your condition, do not drive, operate machinery or power tools, or sign legal documents for 24 hours. Do not drink alcohol, take sleeping pills, or take other medications that may interfere with treatment. See your caregiver immediately if you have pain that is becoming worse and not relieved by medications. Document Released: 08/25/2001 Document Revised: 09/20/2013 Document Reviewed: 11/30/2005 [facility]ExitCare Patient Information 2015 [place]ExitCare, [place]. This information is not intended to replace advice given to you by your health care provider. Make sure you discuss any questions you have with your health care provider.

## 2014-06-06 NOTE — ED Notes (Signed)
Patient is alert and oriented x3.  She was given DC instructions and follow up visit instructions.  Patient gave verbal understanding. She was DC ambulatory under her own power to home.  V/S stable.  He was not showing any signs of distress on DC 

## 2014-12-12 ENCOUNTER — Encounter (HOSPITAL_COMMUNITY): Payer: Self-pay

## 2014-12-12 ENCOUNTER — Emergency Department (HOSPITAL_COMMUNITY)
Admission: EM | Admit: 2014-12-12 | Discharge: 2014-12-12 | Disposition: A | Discharge status: Home / Self Care | Payer: 59 | Attending: Emergency Medicine | Admitting: Emergency Medicine

## 2014-12-12 DIAGNOSIS — Z7982 Long term (current) use of aspirin: Secondary | ICD-10-CM | POA: Insufficient documentation

## 2014-12-12 DIAGNOSIS — Z79899 Other long term (current) drug therapy: Secondary | ICD-10-CM | POA: Diagnosis not present

## 2014-12-12 DIAGNOSIS — E1165 Type 2 diabetes mellitus with hyperglycemia: Secondary | ICD-10-CM | POA: Diagnosis not present

## 2014-12-12 DIAGNOSIS — Z791 Long term (current) use of non-steroidal anti-inflammatories (NSAID): Secondary | ICD-10-CM | POA: Insufficient documentation

## 2014-12-12 DIAGNOSIS — Z880 Allergy status to penicillin: Secondary | ICD-10-CM | POA: Insufficient documentation

## 2014-12-12 DIAGNOSIS — E780 Pure hypercholesterolemia: Secondary | ICD-10-CM | POA: Insufficient documentation

## 2014-12-12 DIAGNOSIS — R17 Unspecified jaundice: Secondary | ICD-10-CM | POA: Diagnosis not present

## 2014-12-12 DIAGNOSIS — Z3202 Encounter for pregnancy test, result negative: Secondary | ICD-10-CM | POA: Diagnosis not present

## 2014-12-12 DIAGNOSIS — R739 Hyperglycemia, unspecified: Secondary | ICD-10-CM

## 2014-12-12 DIAGNOSIS — R1084 Generalized abdominal pain: Secondary | ICD-10-CM | POA: Insufficient documentation

## 2014-12-12 DIAGNOSIS — Z8659 Personal history of other mental and behavioral disorders: Secondary | ICD-10-CM | POA: Diagnosis not present

## 2014-12-12 DIAGNOSIS — R109 Unspecified abdominal pain: Secondary | ICD-10-CM | POA: Diagnosis present

## 2014-12-12 DIAGNOSIS — K589 Irritable bowel syndrome without diarrhea: Secondary | ICD-10-CM | POA: Insufficient documentation

## 2014-12-12 LAB — CBC WITH DIFFERENTIAL/PLATELET
BASOS ABS: 0 10*3/uL (ref 0.0–0.1)
BASOS PCT: 0 % (ref 0–1)
Eosinophils Absolute: 0.2 10*3/uL (ref 0.0–0.7)
Eosinophils Relative: 2 % (ref 0–5)
HCT: 38 % (ref 36.0–46.0)
HEMOGLOBIN: 12.3 g/dL (ref 12.0–15.0)
Lymphocytes Relative: 28 % (ref 12–46)
Lymphs Abs: 1.9 10*3/uL (ref 0.7–4.0)
MCH: 27.7 pg (ref 26.0–34.0)
MCHC: 32.4 g/dL (ref 30.0–36.0)
MCV: 85.6 fL (ref 78.0–100.0)
Monocytes Absolute: 0.5 10*3/uL (ref 0.1–1.0)
Monocytes Relative: 8 % (ref 3–12)
NEUTROS ABS: 4.3 10*3/uL (ref 1.7–7.7)
NEUTROS PCT: 62 % (ref 43–77)
PLATELETS: 338 10*3/uL (ref 150–400)
RBC: 4.44 MIL/uL (ref 3.87–5.11)
RDW: 12.4 % (ref 11.5–15.5)
WBC: 6.9 10*3/uL (ref 4.0–10.5)

## 2014-12-12 LAB — URINALYSIS, ROUTINE W REFLEX MICROSCOPIC
BILIRUBIN URINE: NEGATIVE
Glucose, UA: NEGATIVE mg/dL
KETONES UR: 15 mg/dL — AB
LEUKOCYTES UA: NEGATIVE
NITRITE: NEGATIVE
Protein, ur: NEGATIVE mg/dL
SPECIFIC GRAVITY, URINE: 1.014 (ref 1.005–1.030)
UROBILINOGEN UA: 0.2 mg/dL (ref 0.0–1.0)
pH: 5.5 (ref 5.0–8.0)

## 2014-12-12 LAB — COMPREHENSIVE METABOLIC PANEL
ALBUMIN: 3.5 g/dL (ref 3.5–5.2)
ALK PHOS: 58 U/L (ref 39–117)
ALT: 15 U/L (ref 0–35)
AST: 35 U/L (ref 0–37)
Anion gap: 10 (ref 5–15)
BUN: 10 mg/dL (ref 6–23)
CO2: 22 mmol/L (ref 19–32)
Calcium: 9.1 mg/dL (ref 8.4–10.5)
Chloride: 102 mEq/L (ref 96–112)
Creatinine, Ser: 0.64 mg/dL (ref 0.50–1.10)
GFR calc Af Amer: >90 mL/min (ref >90)
GFR calc non Af Amer: >90 mL/min (ref >90)
Glucose, Bld: 246 mg/dL — ABNORMAL HIGH (ref 70–99)
POTASSIUM: 5.3 mmol/L — AB (ref 3.5–5.1)
Sodium: 134 mmol/L — ABNORMAL LOW (ref 135–145)
Total Bilirubin: 1.4 mg/dL — ABNORMAL HIGH (ref 0.3–1.2)
Total Protein: 6.3 g/dL (ref 6.0–8.3)

## 2014-12-12 LAB — URINE MICROSCOPIC-ADD ON

## 2014-12-12 LAB — LIPASE, BLOOD: LIPASE: 35 U/L (ref 11–59)

## 2014-12-12 LAB — PREGNANCY, URINE: Preg Test, Ur: NEGATIVE

## 2014-12-12 LAB — I-STAT TROPONIN, ED: Troponin i, poc: 0 ng/mL (ref 0.00–0.08)

## 2014-12-12 MED ORDER — DICYCLOMINE HCL 10 MG/ML IM SOLN
20.0000 mg | Freq: Once | INTRAMUSCULAR | Status: AC
  Administered 2014-12-12: 20 mg via INTRAMUSCULAR
  Filled 2014-12-12: qty 2

## 2014-12-12 MED ORDER — ONDANSETRON 4 MG PO TBDP
8.0000 mg | ORAL_TABLET | Freq: Once | ORAL | Status: AC
  Administered 2014-12-12: 8 mg via ORAL
  Filled 2014-12-12: qty 2

## 2014-12-12 MED ORDER — ONDANSETRON 4 MG PO TBDP: 4.0000 mg | ORAL_TABLET | Freq: Three times a day (TID) | ORAL | Status: DC | PRN

## 2014-12-12 MED ORDER — DICYCLOMINE HCL 20 MG PO TABS: 20.0000 mg | ORAL_TABLET | Freq: Two times a day (BID) | ORAL | Status: DC

## 2014-12-12 MED ORDER — ONDANSETRON HCL 4 MG/2ML IJ SOLN
4.0000 mg | Freq: Once | INTRAMUSCULAR | Status: AC
  Administered 2014-12-12: 4 mg via INTRAVENOUS
  Filled 2014-12-12: qty 2

## 2014-12-12 NOTE — ED Notes (Signed)
NP at bedside.

## 2014-12-12 NOTE — ED Notes (Signed)
Pt reports generalized abdominal pain and nausea that started yesterday, she reports she has a hx of IBS. Pt also reports left hip pain that started a week or so ago but denies injury or trauma.

## 2014-12-12 NOTE — ED Notes (Signed)
EDP at bedside  

## 2014-12-12 NOTE — Discharge Instructions (Signed)

## 2014-12-12 NOTE — ED Provider Notes (Signed)
CSN: [id number]     Arrival date & time 12/12/14  0540 History   First MD Initiated Contact with Patient 12/12/14 [id number]     Chief Complaint  Patient presents with  . Abdominal Pain     (Consider location/radiation/quality/duration/timing/severity/associated sxs/prior Treatment) HPI Comments: Pt comes in with complaint of abdominal pain and nausea that has been intermittent over the last year. Pt states that she has had diarrhea but that is consistent with her ibs. She is non tender at this time.Denies fever orvomiting. Hasn't taken anything for the symptoms. Pt states that she has cramping and feels bloated. She states that has been having left hip and lower back pain for the last couple of days no injury. Does have full rom. States that she has arthritis in her back and she thinks it is related to that  The history is provided by the patient. No language interpreter was used.    Past Medical History  Diagnosis Date  . Diabetes mellitus   . Depression   . Hernia, abdominal   . Fatty liver   . Hypercholesteremia   . IBS (irritable bowel syndrome)    Past Surgical History  Procedure Laterality Date  . Cesarean section    . Cholecystectomy     Family History  Problem Relation Age of Onset  . Hypertension Mother   . Heart failure Mother    History  Substance Use Topics  . Smoking status: Never Smoker   . Smokeless tobacco: Not on file  . Alcohol Use: No   OB History    No data available     Review of Systems  All other systems reviewed and are negative.     Allergies  Penicillins  Home Medications   Prior to Admission medications   Medication Sig Start Date End Date Taking? Authorizing Provider  dicyclomine (BENTYL) 20 MG tablet Take 1 tablet (20 mg total) by mouth every 6 (six) hours as needed for spasms (for abdominal cramping). 06/06/14  Yes Olivia Mackielga M Otter, MD  MAGNESIUM PO Take 1 tablet by mouth daily as needed (cramping).   Yes Historical Provider, MD   metFORMIN (GLUCOPHAGE-XR) 500 MG 24 hr tablet Take 500 mg by mouth daily with breakfast.   Yes Historical Provider, MD  naproxen sodium (ANAPROX) 220 MG tablet Take 440 mg by mouth 2 (two) times daily as needed (pain).   Yes Historical Provider, MD  Vitamin D, Ergocalciferol, (DRISDOL) 50000 UNITS CAPS capsule Take 50,000 Units by mouth every 7 (seven) days.   Yes Historical Provider, MD  aspirin 81 MG tablet Take 81 mg by mouth at bedtime.     Historical Provider, MD  atorvastatin (LIPITOR) 20 MG tablet Take 20 mg by mouth at bedtime.     Historical Provider, MD  niacin 500 MG CR capsule Take 500 mg by mouth at bedtime.    Historical Provider, MD   BP 146/90 mmHg  Pulse 96  Temp(Src) 97.8 F (36.6 C) (Oral)  Resp 16  Ht 5\' 11"  (1.803 m)  Wt 305 lb (138.347 kg)  BMI 42.56 kg/m2  SpO2 96%  LMP 12/06/2014 Physical Exam  Constitutional: She is oriented to person, place, and time. She appears well-developed and well-nourished.  HENT:  Head: Normocephalic and atraumatic.  Eyes: Conjunctivae and EOM are normal. Pupils are equal, round, and reactive to light.  Neck: Normal range of motion. Neck supple.  Cardiovascular: Normal rate and regular rhythm.   Pulmonary/Chest: Effort normal and breath sounds normal.  Abdominal:  Soft. Bowel sounds are normal. There is no tenderness.  Musculoskeletal: Normal range of motion.  Neurological: She is alert and oriented to person, place, and time. Coordination normal.  Skin: Skin is warm and dry.  Psychiatric: She has a normal mood and affect.  Nursing note and vitals reviewed.   ED Course  Procedures (including critical care time) Labs Review Labs Reviewed  COMPREHENSIVE METABOLIC PANEL - Abnormal; Notable for the following:    Sodium 134 (*)    Potassium 5.3 (*)    Glucose, Bld 246 (*)    Total Bilirubin 1.4 (*)    All other components within normal limits  URINALYSIS, ROUTINE W REFLEX MICROSCOPIC - Abnormal; Notable for the following:     Hgb urine dipstick SMALL (*)    Ketones, ur 15 (*)    All other components within normal limits  URINE MICROSCOPIC-ADD ON - Abnormal; Notable for the following:    Squamous Epithelial / LPF FEW (*)    Bacteria, UA FEW (*)    All other components within normal limits  CBC WITH DIFFERENTIAL  LIPASE, BLOOD  PREGNANCY, URINE  I-STAT TROPOININ, ED    Imaging Review No results found.   EKG Interpretation None      MDM   Final diagnoses:  Generalized abdominal pain  Elevated bilirubin  Hyperglycemia    Pt is feeling better with bentyl and zofran. Will send home on similar. Abdomen continues to be benign.on re evaluation> bilirubin is slightly elevated from 6 months ago. Discussed follow up with pt    Teressa LowerVrinda Tarique Loveall, NP 12/12/14 [id number]  Teressa LowerVrinda Azlaan Isidore, NP 12/12/14 [id number]  Doug SouSam Jacubowitz, MD 12/12/14 1059

## 2015-02-12 ENCOUNTER — Ambulatory Visit: Payer: Self-pay | Admitting: Obstetrics

## 2015-03-12 ENCOUNTER — Ambulatory Visit: Payer: Self-pay | Admitting: Obstetrics

## 2015-03-21 ENCOUNTER — Ambulatory Visit (INDEPENDENT_AMBULATORY_CARE_PROVIDER_SITE_OTHER): Payer: 59 | Admitting: Certified Nurse Midwife

## 2015-03-21 ENCOUNTER — Ambulatory Visit: Payer: Self-pay | Admitting: Certified Nurse Midwife

## 2015-03-21 ENCOUNTER — Encounter: Payer: Self-pay | Admitting: Certified Nurse Midwife

## 2015-03-21 VITALS — BP 137/88 | HR 100 | Ht 71.0 in | Wt 301.4 lb

## 2015-03-21 DIAGNOSIS — Z01419 Encounter for gynecological examination (general) (routine) without abnormal findings: Secondary | ICD-10-CM | POA: Diagnosis not present

## 2015-03-21 DIAGNOSIS — E669 Obesity, unspecified: Secondary | ICD-10-CM | POA: Insufficient documentation

## 2015-03-21 NOTE — Progress Notes (Signed)
Patient ID: Erica [name], female   DOB: Jul 17, 1971, 44 y.o.   MRN: [id number]    Subjective:     Erica ReiningMarilyn Tucholski-McCandies is a 44 y.o. female here for a routine exam.  Current complaints: desires to see nutritionist.  Has DM type 2 on medications, [facility].    Married with stable partner.  Not using birth control.  Spouse unable to have children.  Has problems with IBS, last ED visit in December.    Personal health questionnaire:  Is patient Ashkenazi Jewish, have a family history of breast and/or ovarian cancer: no Is there a family history of uterine cancer diagnosed at age < 7450, gastrointestinal cancer, urinary tract cancer, family member who is a Personnel officerLynch syndrome-associated carrier: no Is the patient overweight and hypertensive, family history of diabetes, personal history of gestational diabetes, preeclampsia or PCOS: yes Is patient over 9355, have PCOS,  family history of premature CHD under age 42, diabetes, smoke, have hypertension or peripheral artery disease:  yes At any time, has a partner hit, kicked or otherwise hurt or frightened you?: no Over the past 2 weeks, have you felt down, depressed or hopeless?: no Over the past 2 weeks, have you felt little interest or pleasure in doing things?:no   Gynecologic History Patient's last menstrual period was 03/11/2015. Contraception: none Last Pap: unknown. Results were: normal according to patient Last mammogram: 03/08/15. Results were: normal according to the patient was performed by a mobile mammography program.    Obstetric History OB History  No data available    Past Medical History  Diagnosis Date  . Diabetes mellitus   . Depression   . Hernia, abdominal   . Fatty liver   . Hypercholesteremia   . IBS (irritable bowel syndrome)     Past Surgical History  Procedure Laterality Date  . Cesarean section    . Cholecystectomy       Current outpatient prescriptions:  .  atorvastatin  (LIPITOR) 20 MG tablet, Take 20 mg by mouth at bedtime. , Disp: , Rfl:  .  metFORMIN (GLUCOPHAGE-XR) 500 MG 24 hr tablet, Take 500 mg by mouth daily with breakfast., Disp: , Rfl:  .  Vitamin D, Ergocalciferol, (DRISDOL) 50000 UNITS CAPS capsule, Take 50,000 Units by mouth every 7 (seven) days., Disp: , Rfl:  .  aspirin 81 MG tablet, Take 81 mg by mouth at bedtime. , Disp: , Rfl:  .  dicyclomine (BENTYL) 20 MG tablet, Take 1 tablet (20 mg total) by mouth 2 (two) times daily. (Patient not taking: Reported on 03/21/2015), Disp: 20 tablet, Rfl: 0 .  MAGNESIUM PO, Take 1 tablet by mouth daily as needed (cramping)., Disp: , Rfl:  .  naproxen sodium (ANAPROX) 220 MG tablet, Take 440 mg by mouth 2 (two) times daily as needed (pain)., Disp: , Rfl:  .  niacin 500 MG CR capsule, Take 500 mg by mouth at bedtime., Disp: , Rfl:  .  ondansetron (ZOFRAN ODT) 4 MG disintegrating tablet, Take 1 tablet (4 mg total) by mouth every 8 (eight) hours as needed for nausea or vomiting. (Patient not taking: Reported on 03/21/2015), Disp: 20 tablet, Rfl: 0 Allergies  Allergen Reactions  . Penicillins     Reported from childhood    History  Substance Use Topics  . Smoking status: Never Smoker   . Smokeless tobacco: Not on file  . Alcohol Use: No    Family History  Problem Relation Age of Onset  . Hypertension Mother   .  Heart failure Mother       Review of Systems  Constitutional: negative for fatigue and weight loss Respiratory: negative for cough and wheezing Cardiovascular: negative for chest pain, fatigue and palpitations Gastrointestinal: negative for abdominal pain and change in bowel habits Musculoskeletal:negative for myalgias Neurological: negative for gait problems and tremors Behavioral/Psych: negative for abusive relationship, depression Endocrine: negative for temperature intolerance   Genitourinary:negative for abnormal menstrual periods, genital lesions, hot flashes, sexual problems and vaginal  discharge Integument/breast: negative for breast lump, breast tenderness, nipple discharge and skin lesion(s)    Objective:       BP 137/88 mmHg  Pulse 100  Ht  (1.803 m)  Wt 136.714 kg (301 lb 6.4 oz)  BMI 42.06 kg/m2  LMP 03/11/2015 General:   alert  Skin:   no rash or abnormalities  Lungs:   clear to auscultation bilaterally  Heart:   regular rate and rhythm, S1, S2 normal, no murmur, click, rub or gallop  Breasts:   deferred d/t patient request  Abdomen:  normal findings: no organomegaly, soft, non-tender and no hernia Difficult to assess d/t body habitus  Pelvis:  External genitalia: normal general appearance Urinary system: urethral meatus normal and bladder without fullness, nontender Vaginal: normal without tenderness, induration or masses Cervix: normal appearance Adnexa: normal bimanual exam, difficult to assess d/t body habitus Uterus: anteverted and non-tender, normal size   Lab Review Urine pregnancy test Labs reviewed yes Radiologic studies reviewed no  50% of 30 min visit spent on counseling and coordination of care.   Assessment:    Healthy female exam.   Obesity    Plan:    Education reviewed: calcium supplements, depression evaluation, low fat, low cholesterol diet, safe sex/STD prevention, self breast exams, skin cancer screening and weight bearing exercise. Contraception: none. Follow up in: 1 year.   No orders of the defined types were placed in this encounter.   Orders Placed This Encounter  Procedures  . Referral to [facility]    Referral Priority:  Routine    Referral Type:  Consultation    Referral Reason:  Specialty Services Required    Number of Visits Requested:  1    Need to get records

## 2015-03-21 NOTE — Addendum Note (Signed)
Addended by: Marya LandryFOSTER, Jartavious Mckimmy D on: 03/21/2015 05:10 PM   Modules accepted: Orders

## 2015-03-24 LAB — SURESWAB, VAGINOSIS/VAGINITIS PLUS
ATOPOBIUM VAGINAE: NOT DETECTED Log (cells/mL)
C. ALBICANS, DNA: NOT DETECTED
C. glabrata, DNA: NOT DETECTED
C. parapsilosis, DNA: NOT DETECTED
C. trachomatis RNA, TMA: NOT DETECTED
C. tropicalis, DNA: NOT DETECTED
Gardnerella vaginalis: <4.7 Log (cells/mL)
LACTOBACILLUS SPECIES: 7.6 Log (cells/mL)
MEGASPHAERA SPECIES: NOT DETECTED Log (cells/mL)
N. GONORRHOEAE RNA, TMA: NOT DETECTED
T. vaginalis RNA, QL TMA: NOT DETECTED

## 2015-03-25 LAB — PAP IG AND HPV HIGH-RISK: HPV DNA High Risk: NOT DETECTED

## 2015-04-09 ENCOUNTER — Encounter: Payer: 59 | Attending: Certified Nurse Midwife

## 2015-04-09 VITALS — Ht 71.5 in | Wt 305.1 lb

## 2015-04-09 DIAGNOSIS — Z713 Dietary counseling and surveillance: Secondary | ICD-10-CM | POA: Insufficient documentation

## 2015-04-09 DIAGNOSIS — Z6841 Body Mass Index (BMI) 40.0 and over, adult: Secondary | ICD-10-CM | POA: Diagnosis not present

## 2015-04-09 DIAGNOSIS — E1165 Type 2 diabetes mellitus with hyperglycemia: Secondary | ICD-10-CM

## 2015-04-09 DIAGNOSIS — E118 Type 2 diabetes mellitus with unspecified complications: Secondary | ICD-10-CM | POA: Insufficient documentation

## 2015-04-09 DIAGNOSIS — IMO0002 Reserved for concepts with insufficient information to code with codable children: Secondary | ICD-10-CM

## 2015-04-09 NOTE — Progress Notes (Signed)
Patient was seen on 04/09/15 for the first of a series of three diabetes self-management courses at [facility].  Patient Education Plan per assessed needs and concerns is to attend four course education program for Diabetes Self Management Education.  The following learning objectives were met by the patient during this class:  Describe diabetes  State some common risk factors for diabetes  Defines the role of glucose and insulin  Identifies type of diabetes and pathophysiology  Describe the relationship between diabetes and cardiovascular risk  State the members of the Healthcare Team  States the rationale for glucose monitoring  State when to test glucose  State their individual Target Range  State the importance of logging glucose readings  Describe how to interpret glucose readings  Identifies A1C target  Explain the correlation between A1c and eAG values  State symptoms and treatment of high blood glucose  State symptoms and treatment of low blood glucose  Explain proper technique for glucose testing  Identifies proper sharps disposal  Handouts given during class include:  Living Well with Diabetes book  Carb Counting and Meal Planning book  Meal Plan Card  Carbohydrate guide  Meal planning worksheet  Low Sodium Flavoring Tips  The diabetes portion plate  U3N to eAG Conversion Chart  Diabetes Medications  Diabetes Recommended Care Schedule  Support Group  Diabetes Success Plan  Core Class Satisfaction Survey  Follow-Up Plan:  Attend core 2

## 2015-04-16 ENCOUNTER — Encounter: Payer: 59 | Attending: Certified Nurse Midwife

## 2015-04-16 DIAGNOSIS — Z6841 Body Mass Index (BMI) 40.0 and over, adult: Secondary | ICD-10-CM | POA: Insufficient documentation

## 2015-04-16 DIAGNOSIS — Z713 Dietary counseling and surveillance: Secondary | ICD-10-CM | POA: Diagnosis not present

## 2015-04-16 DIAGNOSIS — E1165 Type 2 diabetes mellitus with hyperglycemia: Secondary | ICD-10-CM

## 2015-04-16 DIAGNOSIS — E118 Type 2 diabetes mellitus with unspecified complications: Secondary | ICD-10-CM | POA: Insufficient documentation

## 2015-04-16 DIAGNOSIS — IMO0002 Reserved for concepts with insufficient information to code with codable children: Secondary | ICD-10-CM

## 2015-04-16 NOTE — Progress Notes (Signed)

## 2015-04-23 DIAGNOSIS — E118 Type 2 diabetes mellitus with unspecified complications: Principal | ICD-10-CM

## 2015-04-23 DIAGNOSIS — IMO0002 Reserved for concepts with insufficient information to code with codable children: Secondary | ICD-10-CM

## 2015-04-23 DIAGNOSIS — E1165 Type 2 diabetes mellitus with hyperglycemia: Secondary | ICD-10-CM

## 2015-04-23 NOTE — Progress Notes (Signed)
Patient was seen on 04/23/15 for the third of a series of three diabetes self-management courses at the [facility].   Janene Madeira. State the amount of activity recommended for healthy living . Describe activities suitable for individual needs . Identify ways to regularly incorporate activity into daily life . Identify barriers to activity and ways to over come these barriers  Identify diabetes medications being personally used and their primary action for lowering glucose and possible side effects . Describe role of stress on blood glucose and develop strategies to address psychosocial issues . Identify diabetes complications and ways to prevent them  Explain how to manage diabetes during illness . Evaluate success in meeting personal goal . Establish 2-3 goals that they will plan to diligently work on until they return for the  7882-month follow-up visit  Goals:   I will count my carb choices at most meals and snacks  I will take my diabetes medications as scheduled  I will test my glucose at least 2 times a day, 7 days a week  To help manage stress I will use prayer   Your patient has identified these potential barriers to change:  Motivation  Your patient has identified their diabetes self-care support plan as  None stated Plan:  Attend Core 4 in 4 months

## 2016-04-08 ENCOUNTER — Ambulatory Visit: Payer: 59 | Admitting: Certified Nurse Midwife

## 2016-04-10 ENCOUNTER — Ambulatory Visit (INDEPENDENT_AMBULATORY_CARE_PROVIDER_SITE_OTHER): Payer: Managed Care, Other (non HMO) | Admitting: Certified Nurse Midwife

## 2016-04-10 ENCOUNTER — Other Ambulatory Visit: Payer: Self-pay | Admitting: Certified Nurse Midwife

## 2016-04-10 DIAGNOSIS — Z1389 Encounter for screening for other disorder: Secondary | ICD-10-CM

## 2016-04-10 DIAGNOSIS — R319 Hematuria, unspecified: Secondary | ICD-10-CM

## 2016-04-10 LAB — POCT URINALYSIS DIPSTICK
BILIRUBIN UA: NEGATIVE
BILIRUBIN UA: NEGATIVE
GLUCOSE UA: 250
GLUCOSE UA: NEGATIVE
Ketones, UA: NEGATIVE
Leukocytes, UA: NEGATIVE
Leukocytes, UA: NEGATIVE
Nitrite, UA: NEGATIVE
Nitrite, UA: NEGATIVE
Protein, UA: NEGATIVE
Protein, UA: NEGATIVE
RBC UA: NEGATIVE
SPEC GRAV UA: 1.015
Spec Grav, UA: 1.015
UROBILINOGEN UA: NEGATIVE
Urobilinogen, UA: NEGATIVE
pH, UA: 5
pH, UA: 5

## 2016-04-14 NOTE — Progress Notes (Signed)
Patient did not stay for exam

## 2016-06-03 DIAGNOSIS — G629 Polyneuropathy, unspecified: Secondary | ICD-10-CM | POA: Insufficient documentation

## 2016-06-09 DIAGNOSIS — H9203 Otalgia, bilateral: Secondary | ICD-10-CM | POA: Insufficient documentation

## 2016-06-09 DIAGNOSIS — M26629 Arthralgia of temporomandibular joint, unspecified side: Secondary | ICD-10-CM | POA: Insufficient documentation

## 2016-06-29 ENCOUNTER — Ambulatory Visit: Payer: Managed Care, Other (non HMO) | Admitting: Obstetrics

## 2016-10-26 ENCOUNTER — Ambulatory Visit: Payer: 59 | Admitting: Certified Nurse Midwife

## 2016-12-22 ENCOUNTER — Ambulatory Visit (INDEPENDENT_AMBULATORY_CARE_PROVIDER_SITE_OTHER): Payer: BLUE CROSS/BLUE SHIELD | Admitting: Obstetrics

## 2016-12-22 ENCOUNTER — Encounter: Payer: Self-pay | Admitting: Obstetrics

## 2016-12-22 VITALS — BP 134/83 | HR 118 | Ht 71.0 in | Wt 297.0 lb

## 2016-12-22 DIAGNOSIS — Z0000 Encounter for general adult medical examination without abnormal findings: Secondary | ICD-10-CM | POA: Diagnosis not present

## 2016-12-22 DIAGNOSIS — Z01419 Encounter for gynecological examination (general) (routine) without abnormal findings: Secondary | ICD-10-CM

## 2016-12-22 DIAGNOSIS — Z3009 Encounter for other general counseling and advice on contraception: Secondary | ICD-10-CM

## 2016-12-22 DIAGNOSIS — Z124 Encounter for screening for malignant neoplasm of cervix: Secondary | ICD-10-CM

## 2016-12-22 NOTE — Progress Notes (Signed)
Pt requests Mirena IUD insertion. Pt declines breast exam today. She states recent mgm was normal.

## 2016-12-22 NOTE — Progress Notes (Signed)
Subjective:        Erica [name] is a 46 y.o. female here for a routine exam.  Current complaints: None.    Personal health questionnaire:  Is patient Ashkenazi Jewish, have a family history of breast and/or ovarian cancer: no Is there a family history of uterine cancer diagnosed at age < 5250, gastrointestinal cancer, urinary tract cancer, family member who is a Personnel officerLynch syndrome-associated carrier: no Is the patient overweight and hypertensive, family history of diabetes, personal history of gestational diabetes, preeclampsia or PCOS: no Is patient over 6455, have PCOS,  family history of premature CHD under age 44, diabetes, smoke, have hypertension or peripheral artery disease:  no At any time, has a partner hit, kicked or otherwise hurt or frightened you?: no Over the past 2 weeks, have you felt down, depressed or hopeless?: no Over the past 2 weeks, have you felt little interest or pleasure in doing things?:no   Gynecologic History No LMP recorded. Contraception: none.  Abstinent  Last Pap: 2015. Results were: normal Last mammogram: 2017. Results were: normal  Obstetric History OB History  Gravida Para Term Preterm AB Living  1         1  SAB TAB Ectopic Multiple Live Births          1    # Outcome Date GA Lbr Len/2nd Weight Sex Delivery Anes PTL Lv  1 Gravida 05/16/96    F CS-LTranv   LIV      Past Medical History:  Diagnosis Date  . Depression   . Diabetes mellitus   . Fatty liver   . Hernia, abdominal   . Hypercholesteremia   . IBS (irritable bowel syndrome)     Past Surgical History:  Procedure Laterality Date  . CESAREAN SECTION    . CHOLECYSTECTOMY       Current Outpatient Prescriptions:  .  aspirin 81 MG tablet, Take 81 mg by mouth at bedtime. , Disp: , Rfl:  .  gabapentin (NEURONTIN) 300 MG capsule, Take 300 mg by mouth., Disp: , Rfl:  .  niacin 500 MG CR capsule, Take by mouth., Disp: , Rfl:  .  sitaGLIPtin-metformin (JANUMET) 50-1000 MG  tablet, Take 1 tablet by mouth 2 (two) times daily with a meal., Disp: , Rfl:  .  atorvastatin (LIPITOR) 20 MG tablet, Take 20 mg by mouth at bedtime. , Disp: , Rfl:  .  dicyclomine (BENTYL) 20 MG tablet, Take 1 tablet (20 mg total) by mouth 2 (two) times daily. (Patient not taking: Reported on 12/22/2016), Disp: 20 tablet, Rfl: 0 .  MAGNESIUM PO, Take 1 tablet by mouth daily as needed (cramping)., Disp: , Rfl:  .  metFORMIN (GLUCOPHAGE-XR) 500 MG 24 hr tablet, Take 500 mg by mouth daily with breakfast., Disp: , Rfl:  .  naproxen sodium (ANAPROX) 220 MG tablet, Take 440 mg by mouth 2 (two) times daily as needed (pain)., Disp: , Rfl:  .  ondansetron (ZOFRAN ODT) 4 MG disintegrating tablet, Take 1 tablet (4 mg total) by mouth every 8 (eight) hours as needed for nausea or vomiting. (Patient not taking: Reported on 12/22/2016), Disp: 20 tablet, Rfl: 0 .  Vitamin D, Ergocalciferol, (DRISDOL) 50000 UNITS CAPS capsule, Take 50,000 Units by mouth every 7 (seven) days., Disp: , Rfl:  Allergies  Allergen Reactions  . Penicillins     Reported from childhood    Social History  Substance Use Topics  . Smoking status: Never Smoker  . Smokeless tobacco: Not on  file  . Alcohol use No    Family History  Problem Relation Age of Onset  . Hypertension Mother   . Heart failure Mother   . Fibroids Mother   . Fibroids Sister   . Fibroids Maternal Grandmother   . Diabetes type II Paternal Grandmother       Review of Systems  Constitutional: negative for fatigue and weight loss Respiratory: negative for cough and wheezing Cardiovascular: negative for chest pain, fatigue and palpitations Gastrointestinal: negative for abdominal pain and change in bowel habits Musculoskeletal:negative for myalgias Neurological: negative for gait problems and tremors Behavioral/Psych: negative for abusive relationship, depression Endocrine: negative for temperature intolerance    Genitourinary:negative for abnormal menstrual  periods, genital lesions, hot flashes, sexual problems and vaginal discharge Integument/breast: negative for breast lump, breast tenderness, nipple discharge and skin lesion(s)    Objective:       BP 134/83   Pulse (!) 118   Ht 5\' 11"  (1.803 m)   Wt 297 lb (134.7 kg)   BMI 41.42 kg/m  General:   alert  Skin:   no rash or abnormalities  Lungs:   clear to auscultation bilaterally  Heart:   regular rate and rhythm, S1, S2 normal, no murmur, click, rub or gallop  Breasts:   normal without suspicious masses, skin or nipple changes or axillary nodes  Abdomen:  normal findings: no organomegaly, soft, non-tender and no hernia  Pelvis:  External genitalia: normal general appearance Urinary system: urethral meatus normal and bladder without fullness, nontender Vaginal: normal without tenderness, induration or masses Cervix: normal appearance Adnexa: normal bimanual exam Uterus: anteverted and non-tender, normal size   Lab Review Urine pregnancy test Labs reviewed yes Radiologic studies reviewed yes  50% of 20 min visit spent on counseling and coordination of care.    Assessment:    Healthy female exam.    Contraceptive Counseling and Advice.  Wants Mirena IUD.   Plan:    Patient will call and schedule Mirena IUD Insertion with nenses  Education reviewed: calcium supplements, depression evaluation, low fat, low cholesterol diet, self breast exams and weight bearing exercise. Contraception: IUD. Follow up in: a few months. Mirena IUD Insertion   Meds ordered this encounter  Medications  . DISCONTD: niacin 100 MG tablet    Sig: Take by mouth.  . gabapentin (NEURONTIN) 300 MG capsule    Sig: Take 300 mg by mouth.  . niacin 500 MG CR capsule    Sig: Take by mouth.  . sitaGLIPtin-metformin (JANUMET) 50-1000 MG tablet    Sig: Take 1 tablet by mouth 2 (two) times daily with a meal.   No orders of the defined types were placed in this encounter.    Patient ID: Erica [name], female   DOB: 06-09-1971, 47 y.o.   MRN: [id number]

## 2016-12-24 LAB — CYTOLOGY - PAP
DIAGNOSIS: NEGATIVE
HPV: NOT DETECTED

## 2017-04-06 ENCOUNTER — Encounter: Payer: BLUE CROSS/BLUE SHIELD | Attending: Physician Assistant | Admitting: Registered"

## 2017-04-06 ENCOUNTER — Encounter: Payer: Self-pay | Admitting: Registered"

## 2017-04-06 DIAGNOSIS — E1140 Type 2 diabetes mellitus with diabetic neuropathy, unspecified: Secondary | ICD-10-CM | POA: Insufficient documentation

## 2017-04-06 DIAGNOSIS — E118 Type 2 diabetes mellitus with unspecified complications: Secondary | ICD-10-CM

## 2017-04-06 DIAGNOSIS — Z713 Dietary counseling and surveillance: Secondary | ICD-10-CM | POA: Insufficient documentation

## 2017-04-06 NOTE — Patient Instructions (Addendum)
Plan: Take time on weekends to fill pill box & meal planning Continue working on getting a medical home Keep using the backup plan for medication Continue walking with friend - stress Aim for 4 Carb Choices per meal (approx 60 grams) +/- 1 either way  Aim for 0-2 Carbs per snack if hungry  Include protein in moderation with your meals and snacks Continue reading food labels for Total Carbohydrate and Fat Grams of foods Continue walking daily as tolerated Consider checking BG at alternate times during the day  Consider taking medication as directed by MD

## 2017-04-06 NOTE — Progress Notes (Signed)
Diabetes Self-Management Education  Visit Type: First/Initial  Appt. Start Time: 1610 Appt. End Time: 1720  04/06/2017  [name], identified by name and date of birth, is a 46 y.o. female with a diagnosis of Diabetes: Type 2.   ASSESSMENT Pt retained basic knowledge of diabetes care from previous education in prior years. Pt states she spends a lot of her time and energy focused on helping her husband and daughter with their needs. Pt reports she goes back and forth of motivated to make healthier lifestyle choices and then getting discouraged and not taking steps to manage diabetes. Pt states she drinks sweet tea approx 4x week and a can of Pepsi 1x week.  Medication notes: Pt states she forgets to take Janumet at times. Pt states the gabapentin doesn't sufficiently take care of the pain in her feet and affects her sleep.  Height  (1.803 m), weight 289 lb 14.4 oz (131.5 kg). Body mass index is 40.43 kg/m.      Diabetes Self-Management Education - 04/06/17 1617      Visit Information   Visit Type First/Initial     Initial Visit   Diabetes Type Type 2   Are you currently following a meal plan? No   Are you taking your medications as prescribed? No  forgets to take meds sometimes   Date Diagnosed 3 yrs ago     Health Coping   How would you rate your overall health? Fair     Psychosocial Assessment   Patient Belief/Attitude about Diabetes --  angry   How often do you need to have someone help you when you read instructions, pamphlets, or other written materials from your doctor or pharmacy? 1 - Never   What is the last grade level you completed in school? 12th     Complications   Last HgB A1C per patient/outside source 8.4 %  highest 10.0   How often do you check your blood sugar? --  random checking   Fasting Blood glucose range (mg/dL) [phone]  [phone]   Number of hypoglycemic episodes per month 0   Have you had a dilated eye exam in the past 12  months? Yes   Have you had a dental exam in the past 12 months? Yes   Are you checking your feet? Yes   How many days per week are you checking your feet? 7     Dietary Intake   Breakfast 2-3 sausage patties, small fry, water   Snack (morning) none OR potato chips   Lunch left overs OR cheeseburger OR sandwich   Snack (afternoon) none   Dinner pork chop,cabbage, pinto beans   Snack (evening) none OR cereal    Beverage(s) water, sweet tea 32 oz     Exercise   Exercise Type Light (walking / raking leaves)   How many days per week to you exercise? 3   How many minutes per day do you exercise? 30   Total minutes per week of exercise 90     Patient Education   Previous Diabetes Education Yes (please comment)  has been to [facility] for DSME 2 times   Nutrition management  Role of diet in the treatment of diabetes and the relationship between the three main macronutrients and blood glucose level;Food label reading, portion sizes and measuring food.;Carbohydrate counting   Monitoring Identified appropriate SMBG and/or A1C goals.   Acute complications Taught treatment of hypoglycemia - the 15 rule.     Individualized Goals (developed  by patient)   Nutrition Follow meal plan discussed   Physical Activity Exercise 5-7 days per week     Outcomes   Expected Outcomes Demonstrated interest in learning. Expect positive outcomes   Future DMSE PRN   Program Status Completed      Individualized Plan for Diabetes Self-Management Training:   Learning Objective:  Patient will have a greater understanding of diabetes self-management. Patient education plan is to attend individual and/or group sessions per assessed needs and concerns.   Patient Instructions  Plan: Take time on weekends to fill pill box & meal planning Continue working on getting a medical home Keep using the backup plan for medication Continue walking with friend - stress Aim for 4 Carb Choices per meal (approx 60 grams) +/- 1  either way  Aim for 0-2 Carbs per snack if hungry  Include protein in moderation with your meals and snacks Continue reading food labels for Total Carbohydrate and Fat Grams of foods Continue walking daily as tolerated Consider checking BG at alternate times during the day  Consider taking medication as directed by MD   Expected Outcomes:  Demonstrated interest in learning. Expect positive outcomes  Education material provided: Living Well with Diabetes (copied pages), Food label handouts, A1C conversion sheet, My Plate, Snack sheet and Support group flyer, Planning Healthy Meals  If problems or questions, patient to contact team via:  Phone  Future DSME appointment: PRN

## 2017-05-24 DIAGNOSIS — E559 Vitamin D deficiency, unspecified: Secondary | ICD-10-CM | POA: Insufficient documentation

## 2017-05-24 DIAGNOSIS — E1149 Type 2 diabetes mellitus with other diabetic neurological complication: Secondary | ICD-10-CM | POA: Insufficient documentation

## 2017-05-24 DIAGNOSIS — E785 Hyperlipidemia, unspecified: Secondary | ICD-10-CM | POA: Insufficient documentation

## 2017-05-24 DIAGNOSIS — E1165 Type 2 diabetes mellitus with hyperglycemia: Secondary | ICD-10-CM | POA: Insufficient documentation

## 2017-05-24 DIAGNOSIS — M545 Low back pain, unspecified: Secondary | ICD-10-CM | POA: Insufficient documentation

## 2017-05-24 DIAGNOSIS — K929 Disease of digestive system, unspecified: Secondary | ICD-10-CM | POA: Insufficient documentation

## 2017-07-22 ENCOUNTER — Ambulatory Visit: Payer: BLUE CROSS/BLUE SHIELD | Admitting: Podiatry

## 2017-09-18 ENCOUNTER — Encounter (HOSPITAL_COMMUNITY): Payer: Self-pay

## 2017-09-18 ENCOUNTER — Emergency Department (HOSPITAL_COMMUNITY)
Admission: EM | Admit: 2017-09-18 | Discharge: 2017-09-18 | Disposition: A | Discharge status: Home / Self Care | Payer: BLUE CROSS/BLUE SHIELD | Attending: Physician Assistant | Admitting: Physician Assistant

## 2017-09-18 DIAGNOSIS — Y999 Unspecified external cause status: Secondary | ICD-10-CM | POA: Insufficient documentation

## 2017-09-18 DIAGNOSIS — T148XXA Other injury of unspecified body region, initial encounter: Secondary | ICD-10-CM

## 2017-09-18 DIAGNOSIS — S76012A Strain of muscle, fascia and tendon of left hip, initial encounter: Secondary | ICD-10-CM | POA: Insufficient documentation

## 2017-09-18 DIAGNOSIS — Z7982 Long term (current) use of aspirin: Secondary | ICD-10-CM | POA: Diagnosis not present

## 2017-09-18 DIAGNOSIS — M7918 Myalgia, other site: Secondary | ICD-10-CM | POA: Diagnosis not present

## 2017-09-18 DIAGNOSIS — Y929 Unspecified place or not applicable: Secondary | ICD-10-CM | POA: Insufficient documentation

## 2017-09-18 DIAGNOSIS — E119 Type 2 diabetes mellitus without complications: Secondary | ICD-10-CM | POA: Diagnosis not present

## 2017-09-18 DIAGNOSIS — Z79899 Other long term (current) drug therapy: Secondary | ICD-10-CM | POA: Diagnosis not present

## 2017-09-18 DIAGNOSIS — X58XXXA Exposure to other specified factors, initial encounter: Secondary | ICD-10-CM | POA: Insufficient documentation

## 2017-09-18 DIAGNOSIS — S79912A Unspecified injury of left hip, initial encounter: Secondary | ICD-10-CM | POA: Diagnosis present

## 2017-09-18 DIAGNOSIS — Z7984 Long term (current) use of oral hypoglycemic drugs: Secondary | ICD-10-CM | POA: Diagnosis not present

## 2017-09-18 DIAGNOSIS — M25552 Pain in left hip: Secondary | ICD-10-CM

## 2017-09-18 DIAGNOSIS — Y939 Activity, unspecified: Secondary | ICD-10-CM | POA: Diagnosis not present

## 2017-09-18 MED ORDER — IBUPROFEN 400 MG PO TABS
400.0000 mg | ORAL_TABLET | Freq: Once | ORAL | Status: AC
Start: 2017-09-18 — End: 2017-09-18
  Administered 2017-09-18: 400 mg via ORAL
  Filled 2017-09-18: qty 1

## 2017-09-18 MED ORDER — METHOCARBAMOL 500 MG PO TABS: 500.0000 mg | ORAL_TABLET | Freq: Two times a day (BID) | ORAL | 0 refills | Status: DC

## 2017-09-18 MED ORDER — METHOCARBAMOL 500 MG PO TABS
750.0000 mg | ORAL_TABLET | Freq: Once | ORAL | Status: AC
  Administered 2017-09-18: 750 mg via ORAL
  Filled 2017-09-18: qty 2

## 2017-09-18 NOTE — ED Provider Notes (Signed)
[facility]-EMERGENCY DEPT Provider Note   CSN: [id number] Arrival date & time: 09/18/17  1409     History   Chief Complaint Chief Complaint  Patient presents with  . Hip Pain    HPI Erica [name] is a 46 y.o. female.  HPI  46 y.o. female with a hx of DM, HLD, presents to the Emergency Department today due to left hip pain "for years." Denies injury to area. Notes in the past 24 hours she felt increased pain around left buttock. No trauma or falls. Notes pain with ambulation. Improved with rest. Rates pain 3/10 with ambulation. Described as dull ache. No numbness/tingling. Motrin PRN. No other symptoms noted   Past Medical History:  Diagnosis Date  . Depression   . Diabetes mellitus   . Fatty liver   . Hernia, abdominal   . Hypercholesteremia   . IBS (irritable bowel syndrome)     Patient Active Problem List   Diagnosis Date Noted  . Obesity 03/21/2015    Past Surgical History:  Procedure Laterality Date  . CESAREAN SECTION    . CHOLECYSTECTOMY      OB History    Gravida Para Term Preterm AB Living   1         1   SAB TAB Ectopic Multiple Live Births           1       Home Medications    Prior to Admission medications   Medication Sig Start Date End Date Taking? Authorizing Provider  aspirin 81 MG tablet Take 81 mg by mouth at bedtime.     [provider]  atorvastatin (LIPITOR) 20 MG tablet Take 20 mg by mouth at bedtime.     [provider]  dicyclomine (BENTYL) 20 MG tablet Take 1 tablet (20 mg total) by mouth 2 (two) times daily. Patient not taking: Reported on 12/22/2016 12/12/14   Teressa Lower, NP  DULoxetine HCl (CYMBALTA PO) Take by mouth.    [provider]  gabapentin (NEURONTIN) 300 MG capsule Take 300 mg by mouth.    [provider]  MAGNESIUM PO Take 1 tablet by mouth daily as needed (cramping).    [provider]  metFORMIN (GLUCOPHAGE-XR) 500 MG 24 hr tablet Take 500 mg by mouth daily with  breakfast.    [provider]  naproxen sodium (ANAPROX) 220 MG tablet Take 440 mg by mouth 2 (two) times daily as needed (pain).    [provider]  niacin 500 MG CR capsule Take by mouth.    [provider]  ondansetron (ZOFRAN ODT) 4 MG disintegrating tablet Take 1 tablet (4 mg total) by mouth every 8 (eight) hours as needed for nausea or vomiting. Patient not taking: Reported on 12/22/2016 12/12/14   Teressa Lower, NP  sitaGLIPtin-metformin (JANUMET) 50-1000 MG tablet Take 1 tablet by mouth 2 (two) times daily with a meal.    [provider]  Vitamin D, Ergocalciferol, (DRISDOL) 50000 UNITS CAPS capsule Take 50,000 Units by mouth every 7 (seven) days.    [provider]    Family History Family History  Problem Relation Age of Onset  . Hypertension Mother   . Heart failure Mother   . Fibroids Mother   . Fibroids Sister   . Fibroids Maternal Grandmother   . Diabetes type II Paternal Grandmother     Social History Social History  Substance Use Topics  . Smoking status: Never Smoker  . Smokeless tobacco: Never Used  .  Alcohol use No     Allergies   Penicillins   Review of Systems Review of Systems ROS reviewed and all are negative for acute change except as noted in the HPI.  Physical Exam Updated Vital Signs BP (!) 157/99 (BP Location: Right Arm)   Pulse 98   Temp 98 F (36.7 C) (Oral)   Resp 18   Ht  (1.803 m)   Wt 136.1 kg (300 lb)   LMP 08/19/2017 (Approximate)   SpO2 100%   BMI 41.84 kg/m   Physical Exam  Constitutional: She is oriented to person, place, and time. Vital signs are normal. She appears well-developed and well-nourished.  HENT:  Head: Normocephalic and atraumatic.  Right Ear: Hearing normal.  Left Ear: Hearing normal.  Eyes: Pupils are equal, round, and reactive to light. Conjunctivae and EOM are normal.  Neck: Normal range of motion. Neck supple.  Cardiovascular: Normal rate and regular  rhythm.   Pulmonary/Chest: Effort normal.  Musculoskeletal: Normal range of motion.  Left Hip ROM intact without pain. No pain with inversion/eversion. Ambulates well. TTP along inferior gluteus maximus. No swelling. NVI. Distal pulses appreciated    Neurological: She is alert and oriented to person, place, and time.  Skin: Skin is warm and dry.  Psychiatric: She has a normal mood and affect. Her speech is normal and behavior is normal. Thought content normal.  Nursing note and vitals reviewed.    ED Treatments / Results  Labs (all labs ordered are listed, but only abnormal results are displayed) Labs Reviewed - No data to display  EKG  EKG Interpretation None       Radiology No results found.  Procedures Procedures (including critical care time)  Medications Ordered in ED Medications - No data to display   Initial Impression / Assessment and Plan / ED Course  I have reviewed the triage vital signs and the nursing notes.  Pertinent labs & imaging results that were available during my care of the patient were reviewed by me and considered in my medical decision making (see chart for details).  Final Clinical Impressions(s) / ED Diagnoses   {I have reviewed and evaluated the relevant imaging studies.  {I have reviewed the relevant previous healthcare records.  {I obtained HPI from historian.   ED Course:  Assessment:Pt is a 46 y.o. female with a hx of DM, HLD, presents to the Emergency Department today due to left hip pain "for years." Denies injury to area. Notes in the past 24 hours she felt increased pain around left buttock. No trauma or falls. Notes pain with ambulation. Improved with rest. Rates pain 3/10 with ambulation. Described as dull ache. No numbness/tingling. Motrin PRN. On exam, pt in NAD. Nontoxic/nonseptic appearing. VSS. Afebrile. Lungs CTA. Left Hip ROM intact without pain. No pain with inversion/eversion. Ambulates well. TTP along inferior gluteus maximus.  No swelling. NVI. Distal pulses appreciated. Likely muscular strain. Given Robaxin in ED. Plan is to DC home with follow up to PCP. At time of discharge, Patient is in no acute distress. Vital Signs are stable. Patient is able to ambulate. Patient able to tolerate PO.   Disposition/Plan:  DC Home Additional Verbal discharge instructions given and discussed with patient.  Pt Instructed to f/u with PCP in the next week for evaluation and treatment of symptoms. Return precautions given Pt acknowledges and agrees with plan  Supervising Physician Mackuen, Courteney Lyn, *  Final diagnoses:  Left hip pain  Musculoskeletal pain  Muscle strain  New Prescriptions New Prescriptions   No medications on file     Audry Pili, Cordelia Poche 09/18/17 1705    Abelino Derrick, MD 09/19/17 1623

## 2017-09-18 NOTE — ED Triage Notes (Signed)
Pt endorses left hip pain for years, worse recently. Denies injury or urinary sx. VSS.

## 2017-09-18 NOTE — Discharge Instructions (Signed)
Please read and follow all provided instructions.  Your diagnoses today include:  1. Left hip pain   2. Musculoskeletal pain   3. Muscle strain     Tests performed today include: Vital signs. See below for your results today.   Medications prescribed:  Take as prescribed   Home care instructions:  Follow any educational materials contained in this packet.  Follow-up instructions: Please follow-up with your primary care provider for further evaluation of symptoms and treatment   Return instructions:  Please return to the Emergency Department if you do not get better, if you get worse, or new symptoms OR  - Fever (temperature greater than 101.8F)  - Bleeding that does not stop with holding pressure to the area    -Severe pain (please note that you may be more sore the day after your accident)  - Chest Pain  - Difficulty breathing  - Severe nausea or vomiting  - Inability to tolerate food and liquids  - Passing out  - Skin becoming red around your wounds  - Change in mental status (confusion or lethargy)  - New numbness or weakness    Please return if you have any other emergent concerns.  Additional Information:  Your vital signs today were: BP (!) 157/99 (BP Location: Right Arm)    Pulse 98    Temp 98 F (36.7 C) (Oral)    Resp 18    Ht  (1.803 m)    Wt 136.1 kg (300 lb)    LMP 08/19/2017 (Approximate)    SpO2 100%    BMI 41.84 kg/m  If your blood pressure (BP) was elevated above 135/85 this visit, please have this repeated by your doctor within one month. ---------------

## 2018-05-02 ENCOUNTER — Other Ambulatory Visit: Payer: Self-pay

## 2018-05-02 ENCOUNTER — Emergency Department (HOSPITAL_COMMUNITY)
Admission: EM | Admit: 2018-05-02 | Discharge: 2018-05-02 | Disposition: A | Discharge status: Home / Self Care | Payer: BLUE CROSS/BLUE SHIELD | Attending: Emergency Medicine | Admitting: Emergency Medicine

## 2018-05-02 ENCOUNTER — Encounter (HOSPITAL_COMMUNITY): Payer: Self-pay | Admitting: *Deleted

## 2018-05-02 DIAGNOSIS — M545 Low back pain, unspecified: Secondary | ICD-10-CM

## 2018-05-02 DIAGNOSIS — E119 Type 2 diabetes mellitus without complications: Secondary | ICD-10-CM | POA: Diagnosis not present

## 2018-05-02 DIAGNOSIS — Z7982 Long term (current) use of aspirin: Secondary | ICD-10-CM | POA: Insufficient documentation

## 2018-05-02 DIAGNOSIS — Z79899 Other long term (current) drug therapy: Secondary | ICD-10-CM | POA: Insufficient documentation

## 2018-05-02 DIAGNOSIS — Z7984 Long term (current) use of oral hypoglycemic drugs: Secondary | ICD-10-CM | POA: Insufficient documentation

## 2018-05-02 HISTORY — DX: Unspecified osteoarthritis, unspecified site: M19.90

## 2018-05-02 MED ORDER — KETOROLAC TROMETHAMINE 30 MG/ML IJ SOLN
30.0000 mg | Freq: Once | INTRAMUSCULAR | Status: AC
  Administered 2018-05-02: 30 mg via INTRAMUSCULAR
  Filled 2018-05-02: qty 1

## 2018-05-02 MED ORDER — HYDROCODONE-ACETAMINOPHEN 5-325 MG PO TABS
1.0000 | ORAL_TABLET | Freq: Once | ORAL | Status: AC
  Administered 2018-05-02: 1 via ORAL
  Filled 2018-05-02: qty 1

## 2018-05-02 MED ORDER — LIDOCAINE 5 % EX PTCH: 1.0000 | MEDICATED_PATCH | CUTANEOUS | 0 refills | Status: DC

## 2018-05-02 MED ORDER — MELOXICAM 15 MG PO TABS: 15.0000 mg | ORAL_TABLET | Freq: Every day | ORAL | 0 refills | Status: DC

## 2018-05-02 MED ORDER — METHOCARBAMOL 500 MG PO TABS: 500.0000 mg | ORAL_TABLET | Freq: Two times a day (BID) | ORAL | 0 refills | Status: DC

## 2018-05-02 NOTE — ED Provider Notes (Signed)
[facility] EMERGENCY DEPARTMENT Provider Note   CSN: [id number] Arrival date & time: 05/02/18  1113     History   Chief Complaint Chief Complaint  Patient presents with  . Back Pain    HPI Retina Bernardy is a 47 y.o. female who presents the emergency department today for right lower back pain.  Patient denies any preceding symptoms.  She notes that her pain does not radiate.  She describes it as a dull, achy pain.  She has tried Tylenol and ibuprofen for symptoms with mild relief.  She notes that she does have some relief when she leans to the opposite side.  Her symptoms are worsened by walking, bending down and picking up heavy objects. Rates current pain as 6/10.  Denies history of cancer, trauma, fever, night pain, IV drug use, recent spinal manipulation or procedures, upper back pain or neck pain, numbness/tingling/weakness of the lower extremities, urinary retention, loss of bowel/bladder function, saddle anesthesia, or unexplained weight loss. Denies dysuria, flank pain, suprapubic pain, frequency, urgency, or hematuria.   HPI  Past Medical History:  Diagnosis Date  . Arthritis   . Depression   . Diabetes mellitus   . Fatty liver   . Hernia, abdominal   . Hypercholesteremia   . IBS (irritable bowel syndrome)     Patient Active Problem List   Diagnosis Date Noted  . Obesity 03/21/2015    Past Surgical History:  Procedure Laterality Date  . CESAREAN SECTION    . CHOLECYSTECTOMY       OB History    Gravida  1   Para      Term      Preterm      AB      Living  1     SAB      TAB      Ectopic      Multiple      Live Births  1            Home Medications    Prior to Admission medications   Medication Sig Start Date End Date Taking? Authorizing Provider  aspirin 81 MG tablet Take 81 mg by mouth at bedtime.     [provider]  atorvastatin (LIPITOR) 20 MG tablet Take 20 mg by mouth at bedtime.      [provider]  dicyclomine (BENTYL) 20 MG tablet Take 1 tablet (20 mg total) by mouth 2 (two) times daily. Patient not taking: Reported on 12/22/2016 12/12/14   Teressa Lower, NP  DULoxetine HCl (CYMBALTA PO) Take by mouth.    [provider]  gabapentin (NEURONTIN) 300 MG capsule Take 300 mg by mouth.    [provider]  MAGNESIUM PO Take 1 tablet by mouth daily as needed (cramping).    [provider]  metFORMIN (GLUCOPHAGE-XR) 500 MG 24 hr tablet Take 500 mg by mouth daily with breakfast.    [provider]  methocarbamol (ROBAXIN) 500 MG tablet Take 1 tablet (500 mg total) by mouth 2 (two) times daily. 09/18/17   Audry Pili, PA-C  naproxen sodium (ANAPROX) 220 MG tablet Take 440 mg by mouth 2 (two) times daily as needed (pain).    [provider]  niacin 500 MG CR capsule Take by mouth.    [provider]  ondansetron (ZOFRAN ODT) 4 MG disintegrating tablet Take 1 tablet (4 mg total) by mouth every 8 (eight) hours as needed for nausea or vomiting. Patient not taking:  Reported on 12/22/2016 12/12/14   Teressa Lower, NP  sitaGLIPtin-metformin (JANUMET) 50-1000 MG tablet Take 1 tablet by mouth 2 (two) times daily with a meal.    [provider]  Vitamin D, Ergocalciferol, (DRISDOL) 50000 UNITS CAPS capsule Take 50,000 Units by mouth every 7 (seven) days.    [provider]    Family History Family History  Problem Relation Age of Onset  . Hypertension Mother   . Heart failure Mother   . Fibroids Mother   . Fibroids Sister   . Fibroids Maternal Grandmother   . Diabetes type II Paternal Grandmother     Social History Social History   Tobacco Use  . Smoking status: Never Smoker  . Smokeless tobacco: Never Used  Substance Use Topics  . Alcohol use: No  . Drug use: No     Allergies   Penicillins   Review of Systems Review of Systems  All other systems reviewed and are  negative.    Physical Exam Updated Vital Signs BP (!) 145/91   Pulse 92   Temp 98.3 F (36.8 C) (Oral)   Resp 19   SpO2 99%   Physical Exam  Constitutional: She appears well-developed and well-nourished. No distress.  Non-toxic appearing  HENT:  Head: Normocephalic and atraumatic.  Right Ear: External ear normal.  Left Ear: External ear normal.  Neck: Normal range of motion. Neck supple. No spinous process tenderness present. No neck rigidity. Normal range of motion present.  Cardiovascular: Normal rate, regular rhythm, normal heart sounds and intact distal pulses.  No murmur heard. Pulses:      Radial pulses are 2+ on the right side, and 2+ on the left side.       Femoral pulses are 2+ on the right side, and 2+ on the left side.      Dorsalis pedis pulses are 2+ on the right side, and 2+ on the left side.       Posterior tibial pulses are 2+ on the right side, and 2+ on the left side.  Pulmonary/Chest: Effort normal and breath sounds normal. No respiratory distress.  Abdominal: Soft. Bowel sounds are normal. She exhibits no pulsatile midline mass. There is no tenderness. There is no rigidity, no rebound and no CVA tenderness.  Musculoskeletal:  Posterior and appearance appears normal. No evidence of obvious scoliosis or kyphosis. No obvious signs of skin changes, trauma, deformity, infection. No C, T, or L spine tenderness or step-offs to palpation. No C, T paraspinal tenderness. Right lumbar paraspinal ttp. Right upper gluteus ttp.  No tenderness palpation of the hips.  No sacral crepitus.  Negative logroll test bilaterally.  Normal range of motion of the lower extremities.  Lung expansion normal. Bilateral lower extremity strength 5 out of 5 including extensor hallucis longus. Patellar and Achilles deep tendon reflex 2+ and equal bilaterally. Sensation of lower extremities grossly intact. Straight leg right neg. Straight leg left neg. Gait able but patient notes painful. Lower  extremity compartments soft. PT and DP 2+ b/l. Cap refill <2 seconds.   Neurological: She is alert.  No foot drop  Skin: Skin is warm, dry and intact. Capillary refill takes less than 2 seconds. No rash noted. She is not diaphoretic. No erythema.  No vesicular-like rash noted.  Nursing note and vitals reviewed.    ED Treatments / Results  Labs (all labs ordered are listed, but only abnormal results are displayed) Labs Reviewed - No data to display  EKG None  Radiology  No results found.  Procedures Procedures (including critical care time)  Medications Ordered in ED Medications  ketorolac (TORADOL) 30 MG/ML injection 30 mg (has no administration in time range)  HYDROcodone-acetaminophen (NORCO/VICODIN) 5-325 MG per tablet 1 tablet (has no administration in time range)     Initial Impression / Assessment and Plan / ED Course  I have reviewed the triage vital signs and the nursing notes.  Pertinent labs & imaging results that were available during my care of the patient were reviewed by me and considered in my medical decision making (see chart for details).     Patient with back pain.  No neurological deficits and normal neuro exam.  Patient can walk but states is painful.  No loss of bowel or bladder control.  No concern for cauda equina.  No fever, night sweats, weight loss, h/o cancer, IVDU.  RICE protocol and pain medicine indicated and discussed with patient. I advised the patient to follow-up with PCP vs ortho this week. Specific return precautions discussed. Time was given for all questions to be answered. The patient verbalized understanding and agreement with plan. The patient appears safe for discharge home.  Final Clinical Impressions(s) / ED Diagnoses   Final diagnoses:  Acute right-sided low back pain without sciatica    ED Discharge Orders        Ordered    meloxicam (MOBIC) 15 MG tablet  Daily     05/02/18 1414    lidocaine (LIDODERM) 5 %  Every 24 hours      05/02/18 1414    methocarbamol (ROBAXIN) 500 MG tablet  2 times daily     05/02/18 1414       Princella Pellegrini 05/02/18 1418    Maia Plan, MD 05/02/18 1939

## 2018-05-02 NOTE — ED Notes (Signed)
Pt given ice pack. Pt states she can only wait 30-45 minutes before she has to take her husband to a doctors appt.

## 2018-05-02 NOTE — Discharge Instructions (Signed)
You were seen here today for Back Pain: Low back pain is discomfort in the lower back that may be due to injuries to muscles and ligaments around the spine. Occasionally, it may be caused by a problem to a part of the spine called a disc. Your back pain should be treated with medicines listed below as well as back exercises and this back pain should get better over the next 2 weeks. Most patients get completely well in 4 weeks. It is important to know however, if you develop severe or worsening pain, low back pain with fever, numbness, weakness or inability to walk or urinate, you should return to the ER immediately.  Please follow up with your doctor this week for a recheck if still having symptoms.  HOME INSTRUCTIONS Self - care:  The application of heat can help soothe the pain.  Maintaining your daily activities, including walking (this is encouraged), as it will help you get better faster than just staying in bed. Do not life, push, pull anything more than 10 pounds for the next week. I am attaching back exercises that you can do at home to help facilitate your recovery.   Back Exercises - I have attached a handout on back exercises that can be done at home to help facilitate your recovery.   Medications are also useful to help with pain control.   Acetaminophen.  This medication is generally safe, and found over the counter. Take as directed for your age. You should not take more than 8 of the extra strength ( ) pills a day (max dose is  total OVER one day)  Non steroidal anti inflammatory: This includes medications including Ibuprofen, naproxen and Mobic; These medications help both pain and swelling and are very useful in treating back pain.  They should be taken with food, as they can cause stomach upset, and more seriously, stomach bleeding. Do not combine the medications.   Muscle relaxants:  These medications can help with muscle tightness that is a cause of lower back pain.  Most  of these medications can cause drowsiness, and it is not safe to drive or use dangerous machinery while taking them. They are primarily helpful when taken at night before sleep.  You will need to follow up with your primary healthcare provider or the Orthopedist in 1-2 weeks for reassessment and persistent symptoms.  Be aware that if you develop new symptoms, such as a fever, leg weakness, difficulty with or loss of control of your urine or bowels, abdominal pain, or more severe pain, you will need to seek medical attention and/or return to the Emergency department. Additional Information:  Your vital signs today were: BP (!) 145/91    Pulse 92    Temp 98.3 F (36.8 C) (Oral)    Resp 19    SpO2 99%  If your blood pressure (BP) was elevated above 135/85 this visit, please have this repeated by your doctor within one month. ---------------

## 2018-05-02 NOTE — ED Triage Notes (Signed)
Patient presents to ED for assessment of right buttocks/lower back pain, with a hx of arthritis, no radiation down leg.  Denies changes in urination or incontinence of bowel/bladder.

## 2018-05-02 NOTE — ED Notes (Signed)
Pt verbalized understanding discharge instructions and denies any further needs or questions at this time. VS stable, ambulatory and steady gait.   

## 2018-05-02 NOTE — ED Triage Notes (Signed)
Patient states Aleve and Tylenol tried at home without relief.  Applied heat last night.  Patient currently wearing ice to help.  Biofreeze also applied with no relief.

## 2018-05-10 ENCOUNTER — Encounter (HOSPITAL_COMMUNITY): Payer: Self-pay | Admitting: Emergency Medicine

## 2018-05-10 ENCOUNTER — Other Ambulatory Visit: Payer: Self-pay

## 2018-05-10 ENCOUNTER — Emergency Department (HOSPITAL_COMMUNITY)
Admission: EM | Admit: 2018-05-10 | Discharge: 2018-05-10 | Disposition: A | Discharge status: Home / Self Care | Payer: BLUE CROSS/BLUE SHIELD | Attending: Emergency Medicine | Admitting: Emergency Medicine

## 2018-05-10 DIAGNOSIS — E119 Type 2 diabetes mellitus without complications: Secondary | ICD-10-CM | POA: Insufficient documentation

## 2018-05-10 DIAGNOSIS — Z7984 Long term (current) use of oral hypoglycemic drugs: Secondary | ICD-10-CM | POA: Diagnosis not present

## 2018-05-10 DIAGNOSIS — Z7982 Long term (current) use of aspirin: Secondary | ICD-10-CM | POA: Diagnosis not present

## 2018-05-10 DIAGNOSIS — M545 Low back pain, unspecified: Secondary | ICD-10-CM

## 2018-05-10 DIAGNOSIS — Z79899 Other long term (current) drug therapy: Secondary | ICD-10-CM | POA: Insufficient documentation

## 2018-05-10 LAB — URINALYSIS, ROUTINE W REFLEX MICROSCOPIC
Bilirubin Urine: NEGATIVE
Glucose, UA: 50 mg/dL — AB
Ketones, ur: 20 mg/dL — AB
Leukocytes, UA: NEGATIVE
Nitrite: NEGATIVE
Protein, ur: NEGATIVE mg/dL
Specific Gravity, Urine: 1.023 (ref 1.005–1.030)
pH: 5 (ref 5.0–8.0)

## 2018-05-10 MED ORDER — OXYCODONE-ACETAMINOPHEN 5-325 MG PO TABS
1.0000 | ORAL_TABLET | ORAL | Status: DC | PRN
  Administered 2018-05-10: 1 via ORAL
  Filled 2018-05-10: qty 1

## 2018-05-10 MED ORDER — OXYCODONE-ACETAMINOPHEN 5-325 MG PO TABS: 1.0000 | ORAL_TABLET | Freq: Three times a day (TID) | ORAL | 0 refills | Status: DC | PRN

## 2018-05-10 NOTE — ED Notes (Signed)
Pt states she has appt with ortho tomorrow, is not out of prescription pain meds at this time

## 2018-05-10 NOTE — ED Notes (Signed)
ED Provider at bedside. 

## 2018-05-10 NOTE — ED Notes (Signed)
Patient verbalizes understanding of discharge instructions. Opportunity for questioning and answers were provided. Armband removed by staff, pt discharged from ED ambulatory.   

## 2018-05-10 NOTE — ED Triage Notes (Signed)
Seen multiple times for lower back and right hip pain. States pain increased and seen most recent 2 days ago. States pain did decrease however pain today currently 10/10 sharp.

## 2018-05-10 NOTE — ED Provider Notes (Signed)
[name] EMERGENCY DEPARTMENT Provider Note   CSN: [id number] Arrival date & time: 05/10/18  9562  History   Chief Complaint Chief Complaint  Patient presents with  . Back Pain    HPI Eric[name] is a 47 y.o. female.  HPI   47 year old female presents today with complaints of back pain.  Patient states that that for approximately 10 days she has had lower back pain radiating into her right gluteus.  She denies any significant trauma, notes that this has been waxing and waning with periods of pain that sometimes is relieved.  She notes it is worse with movement, she denies any fever chills, abdominal pain dysuria or abnormal bowel movements.  She denies any trauma to her back.  Patient has been seen 4 times for this.  She has had imaging studies with no significant findings.  Patient has been placed on hydrocodone, prednisone, Toradol, all of which have not improved her symptoms.  She has a follow-up appointment with orthopedics tomorrow.  She denies any loss of distal sensation strength or motor function.  Patient was given a Percocet in triage that improved her symptoms and allowed her to relax.   Past Medical History:  Diagnosis Date  . Arthritis   . Depression   . Diabetes mellitus   . Fatty liver   . Hernia, abdominal   . Hypercholesteremia   . IBS (irritable bowel syndrome)     Patient Active Problem List   Diagnosis Date Noted  . Obesity 03/21/2015    Past Surgical History:  Procedure Laterality Date  . CESAREAN SECTION    . CHOLECYSTECTOMY       OB History    Gravida  1   Para      Term      Preterm      AB      Living  1     SAB      TAB      Ectopic      Multiple      Live Births  1            Home Medications    Prior to Admission medications   Medication Sig Start Date End Date Taking? Authorizing Provider  aspirin 81 MG tablet Take 81 mg by mouth at bedtime.     [provider]    atorvastatin (LIPITOR) 20 MG tablet Take 20 mg by mouth at bedtime.     [provider]  dicyclomine (BENTYL) 20 MG tablet Take 1 tablet (20 mg total) by mouth 2 (two) times daily. Patient not taking: Reported on 12/22/2016 12/12/14   Teressa Lower, NP  DULoxetine HCl (CYMBALTA PO) Take by mouth.    [provider]  gabapentin (NEURONTIN) 300 MG capsule Take 300 mg by mouth.    [provider]  lidocaine (LIDODERM) 5 % Place 1 patch onto the skin daily. Remove & Discard patch within 12 hours or as directed by MD 05/02/18   Maczis, Elmer Sow, PA-C  MAGNESIUM PO Take 1 tablet by mouth daily as needed (cramping).    [provider]  meloxicam (MOBIC) 15 MG tablet Take 1 tablet (15 mg total) by mouth daily. 05/02/18   Maczis, Elmer Sow, PA-C  metFORMIN (GLUCOPHAGE-XR) 500 MG 24 hr tablet Take 500 mg by mouth daily with breakfast.    [provider]  methocarbamol (ROBAXIN) 500 MG tablet Take 1 tablet (500 mg total) by mouth 2 (two) times daily. 05/02/18  Maczis, Elmer Sow, PA-C  naproxen sodium (ANAPROX) 220 MG tablet Take 440 mg by mouth 2 (two) times daily as needed (pain).    [provider]  niacin 500 MG CR capsule Take by mouth.    [provider]  ondansetron (ZOFRAN ODT) 4 MG disintegrating tablet Take 1 tablet (4 mg total) by mouth every 8 (eight) hours as needed for nausea or vomiting. Patient not taking: Reported on 12/22/2016 12/12/14   Teressa Lower, NP  oxyCODONE-acetaminophen (PERCOCET/ROXICET) 5-325 MG tablet Take 1 tablet by mouth every 8 (eight) hours as needed for severe pain. 05/10/18   Alaine Loughney, Tinnie Gens, PA-C  sitaGLIPtin-metformin (JANUMET) 50-1000 MG tablet Take 1 tablet by mouth 2 (two) times daily with a meal.    [provider]  Vitamin D, Ergocalciferol, (DRISDOL) 50000 UNITS CAPS capsule Take 50,000 Units by mouth every 7 (seven) days.    [provider]    Family History Family History   Problem Relation Age of Onset  . Hypertension Mother   . Heart failure Mother   . Fibroids Mother   . Fibroids Sister   . Fibroids Maternal Grandmother   . Diabetes type II Paternal Grandmother     Social History Social History   Tobacco Use  . Smoking status: Never Smoker  . Smokeless tobacco: Never Used  Substance Use Topics  . Alcohol use: No  . Drug use: No     Allergies   Penicillins   Review of Systems Review of Systems  All other systems reviewed and are negative.  Physical Exam Updated Vital Signs BP 120/65 (BP Location: Right Arm)   Pulse 88   Temp 98.8 F (37.1 C) (Oral)   Resp 16   Ht 6' (1.829 m)   Wt 136.1 kg (300 lb)   SpO2 98%   BMI 40.69 kg/m   Physical Exam  Constitutional: She is oriented to person, place, and time. She appears well-developed and well-nourished.  HENT:  Head: Normocephalic and atraumatic.  Eyes: Pupils are equal, round, and reactive to light. Conjunctivae are normal. Right eye exhibits no discharge. Left eye exhibits no discharge. No scleral icterus.  Neck: Normal range of motion. No JVD present. No tracheal deviation present.  Pulmonary/Chest: Effort normal. No stridor.  Musculoskeletal:  No significant CT or L-spine tenderness to palpation, soft tissues unremarkable, distal sensation strength and motor function intact  Neurological: She is alert and oriented to person, place, and time. Coordination normal.  Psychiatric: She has a normal mood and affect. Her behavior is normal. Judgment and thought content normal.  Nursing note and vitals reviewed.    ED Treatments / Results  Labs (all labs ordered are listed, but only abnormal results are displayed) Labs Reviewed  URINALYSIS, ROUTINE W REFLEX MICROSCOPIC - Abnormal; Notable for the following components:      Result Value   Glucose, UA 50 (*)    Hgb urine dipstick SMALL (*)    Ketones, ur 20 (*)    Bacteria, UA RARE (*)    All other components within normal  limits    EKG None  Radiology No results found.  Procedures Procedures (including critical care time)  Medications Ordered in ED Medications  oxyCODONE-acetaminophen (PERCOCET/ROXICET) 5-325 MG per tablet 1 tablet (1 tablet Oral Given 05/10/18 1032)     Initial Impression / Assessment and Plan / ED Course  I have reviewed the triage vital signs and the nursing notes.  Pertinent labs & imaging results that were available during my  care of the patient were reviewed by me and considered in my medical decision making (see chart for details).      Labs: urinalysis   Imaging:  Consults:  Therapeutics:   Discharge Meds:   Assessment/Plan:  47 year old female presents today with low back pain.  Patient has no red flags on exam, she is resting comfortably in exam bed.  Patient had significant improvement in her symptoms with Percocet here.  I do find it reasonable that she have a short prescription of this, she will follow-up tomorrow with her orthopedic specialist, she is given strict return precautions, she verbalized understanding and agreement to today's plan had no further questions or concerns at the time discharge  Final Clinical Impressions(s) / ED Diagnoses   Final diagnoses:  Acute midline low back pain without sciatica    ED Discharge Orders        Ordered    oxyCODONE-acetaminophen (PERCOCET/ROXICET) 5-325 MG tablet  Every 8 hours PRN     05/10/18 1514       Eyvonne Mechanic, PA-C 05/10/18 1515    Pricilla Loveless, MD 05/10/18 2304

## 2018-05-10 NOTE — ED Notes (Signed)
Pt requesting to receive more pain meds while she is here along with another pain prescription. Pt laughing about being at the ED 4 times this week.

## 2018-05-10 NOTE — Discharge Instructions (Addendum)
Please read attached information. If you experience any new or worsening signs or symptoms please return to the emergency room for evaluation. Please follow-up with your primary care provider or specialist as discussed. Please use medication prescribed only as directed and discontinue taking if you have any concerning signs or symptoms.   °

## 2018-05-27 DIAGNOSIS — R809 Proteinuria, unspecified: Secondary | ICD-10-CM | POA: Insufficient documentation

## 2018-07-14 ENCOUNTER — Ambulatory Visit: Payer: BLUE CROSS/BLUE SHIELD | Admitting: Obstetrics

## 2018-07-14 ENCOUNTER — Encounter: Payer: Self-pay | Admitting: Obstetrics

## 2018-07-14 ENCOUNTER — Ambulatory Visit (INDEPENDENT_AMBULATORY_CARE_PROVIDER_SITE_OTHER): Payer: BLUE CROSS/BLUE SHIELD | Admitting: Obstetrics

## 2018-07-14 VITALS — BP 154/85 | HR 106 | Ht 71.0 in | Wt 297.2 lb

## 2018-07-14 DIAGNOSIS — Z01419 Encounter for gynecological examination (general) (routine) without abnormal findings: Secondary | ICD-10-CM

## 2018-07-14 DIAGNOSIS — Z124 Encounter for screening for malignant neoplasm of cervix: Secondary | ICD-10-CM

## 2018-07-14 DIAGNOSIS — Z1151 Encounter for screening for human papillomavirus (HPV): Secondary | ICD-10-CM

## 2018-07-14 NOTE — Progress Notes (Signed)
Subjective:        Erica [name] Erica [name] a 47 y.o. female here for a routine exam.  Current complaints: None.    Personal health questionnaire:  Is patient Erica [name], have a family history of breast and/or ovarian cancer: no Is there a family history of uterine cancer diagnosed at age < 6650, gastrointestinal cancer, urinary tract cancer, family member who is a Personnel officerLynch syndrome-associated carrier: no Is the patient overweight and hypertensive, family history of diabetes, personal history of gestational diabetes, preeclampsia or PCOS: no Is patient over 455, have PCOS,  family history of premature CHD under age 45, diabetes, smoke, have hypertension or peripheral artery disease:  no At any time, has a partner hit, kicked or otherwise hurt or frightened you?: no Over the past 2 weeks, have you felt down, depressed or hopeless?: no Over the past 2 weeks, have you felt little interest or pleasure in doing things?:no   Gynecologic History Patient's last menstrual period was 06/30/2018. Contraception: none Last Pap: 2018. Results were: normal Last mammogram: 2016. Results were: normal  Obstetric History OB History  Gravida Para Term Preterm AB Living  1         1  SAB TAB Ectopic Multiple Live Births          1    # Outcome Date GA Lbr Len/2nd Weight Sex Delivery Anes PTL Lv  1 Gravida 05/16/96    F CS-LTranv   LIV    Past Medical History:  Diagnosis Date  . Arthritis   . Depression   . Diabetes mellitus   . Fatty liver   . Hernia, abdominal   . Hypercholesteremia   . IBS (irritable bowel syndrome)     Past Surgical History:  Procedure Laterality Date  . CESAREAN SECTION    . CHOLECYSTECTOMY       Current Outpatient Medications:  .  aspirin 81 MG tablet, Take 81 mg by mouth at bedtime. , Disp: , Rfl:  .  atorvastatin (LIPITOR) 20 MG tablet, Take 20 mg by mouth at bedtime. , Disp: , Rfl:  .  gabapentin (NEURONTIN) 300 MG capsule, Take 300 mg by mouth., Disp: ,  Rfl:  .  lisinopril (PRINIVIL,ZESTRIL) 5 MG tablet, Take by mouth., Disp: , Rfl:  .  meloxicam (MOBIC) 15 MG tablet, Take 1 tablet (15 mg total) by mouth daily., Disp: 30 tablet, Rfl: 0 .  sitaGLIPtin-metformin (JANUMET) 50-1000 MG tablet, Take 1 tablet by mouth 2 (two) times daily with a meal., Disp: , Rfl:  .  Vitamin D, Ergocalciferol, (DRISDOL) 50000 UNITS CAPS capsule, Take 50,000 Units by mouth every 7 (seven) days., Disp: , Rfl:  Allergies  Allergen Reactions  . Penicillins     Reported from childhood    Social History   Tobacco Use  . Smoking status: Never Smoker  . Smokeless tobacco: Never Used  Substance Use Topics  . Alcohol use: No    Family History  Problem Relation Age of Onset  . Hypertension Mother   . Heart failure Mother   . Fibroids Mother   . Fibroids Sister   . Fibroids Maternal Grandmother   . Diabetes type II Paternal Grandmother       Review of Systems  Constitutional: negative for fatigue and weight loss Respiratory: negative for cough and wheezing Cardiovascular: negative for chest pain, fatigue and palpitations Gastrointestinal: negative for abdominal pain and change in bowel habits Musculoskeletal:negative for myalgias Neurological: negative for gait problems and tremors Behavioral/Psych: negative  for abusive relationship, depression Endocrine: negative for temperature intolerance    Genitourinary:negative for abnormal menstrual periods, genital lesions, hot flashes, sexual problems and vaginal discharge Integument/breast: negative for breast lump, breast tenderness, nipple discharge and skin lesion(s)    Objective:       BP (!) 154/85   Pulse (!) 106   Ht 5\' 11"  (1.803 m)   Wt 297 lb 3.2 oz (134.8 kg)   LMP 06/30/2018   BMI 41.45 kg/m  General:   alert  Skin:   no rash or abnormalities  Lungs:   clear to auscultation bilaterally  Heart:   regular rate and rhythm, S1, S2 normal, no murmur, click, rub or gallop  Breasts:   normal  without suspicious masses, skin or nipple changes or axillary nodes  Abdomen:  normal findings: no organomegaly, soft, non-tender and no hernia  Pelvis:  External genitalia: normal general appearance Urinary system: urethral meatus normal and bladder without fullness, nontender Vaginal: normal without tenderness, induration or masses Cervix: normal appearance Adnexa: normal bimanual exam Uterus: anteverted and non-tender, normal size   Lab Review Urine pregnancy test Labs reviewed yes Radiologic studies reviewed yes  50% of 20 min visit spent on counseling and coordination of care.   Assessment:     1. Encounter for routine gynecological examination with Papanicolaou smear of cervix Rx: - Cytology - PAP   Plan:    Education reviewed: calcium supplements, depression evaluation, low fat, low cholesterol diet, safe sex/STD prevention, self breast exams and weight bearing exercise. Contraception: none. Follow up in: 1 year.   No orders of the defined types were placed in this encounter.  No orders of the defined types were placed in this encounter.   Brock Bad MD 07-14-2018

## 2018-07-14 NOTE — Patient Instructions (Signed)
Menopause Menopause is the normal time of life when menstrual periods stop completely. Menopause is complete when you have missed 12 consecutive menstrual periods. It usually occurs between the ages of 48 years and 55 years. Very rarely does a woman develop menopause before the age of 40 years. At menopause, your ovaries stop producing the female hormones estrogen and progesterone. This can cause undesirable symptoms and also affect your health. Sometimes the symptoms may occur 4-5 years before the menopause begins. There is no relationship between menopause and:  Oral contraceptives.  Number of children you had.  Race.  The age your menstrual periods started (menarche).  Heavy smokers and very thin women may develop menopause earlier in life. What are the causes?  The ovaries stop producing the female hormones estrogen and progesterone. Other causes include:  Surgery to remove both ovaries.  The ovaries stop functioning for no known reason.  Tumors of the pituitary gland in the brain.  Medical disease that affects the ovaries and hormone production.  Radiation treatment to the abdomen or pelvis.  Chemotherapy that affects the ovaries.  What are the signs or symptoms?  Hot flashes.  Night sweats.  Decrease in sex drive.  Vaginal dryness and thinning of the vagina causing painful intercourse.  Dryness of the skin and developing wrinkles.  Headaches.  Tiredness.  Irritability.  Memory problems.  Weight gain.  Bladder infections.  Hair growth of the face and chest.  Infertility. More serious symptoms include:  Loss of bone (osteoporosis) causing breaks (fractures).  Depression.  Hardening and narrowing of the arteries (atherosclerosis) causing heart attacks and strokes.  How is this diagnosed?  When the menstrual periods have stopped for 12 straight months.  Physical exam.  Hormone studies of the blood. How is this treated? There are many treatment  choices and nearly as many questions about them. The decisions to treat or not to treat menopausal changes is an individual choice made with your health care provider. Your health care provider can discuss the treatments with you. Together, you can decide which treatment will work best for you. Your treatment choices may include:  Hormone therapy (estrogen and progesterone).  Non-hormonal medicines.  Treating the individual symptoms with medicine (for example antidepressants for depression).  Herbal medicines that may help specific symptoms.  Counseling by a psychiatrist or psychologist.  Group therapy.  Lifestyle changes including: ? Eating healthy. ? Regular exercise. ? Limiting caffeine and alcohol. ? Stress management and meditation.  No treatment.  Follow these instructions at home:  Take the medicine your health care provider gives you as directed.  Get plenty of sleep and rest.  Exercise regularly.  Eat a diet that contains calcium (good for the bones) and soy products (acts like estrogen hormone).  Avoid alcoholic beverages.  Do not smoke.  If you have hot flashes, dress in layers.  Take supplements, calcium, and vitamin D to strengthen bones.  You can use over-the-counter lubricants or moisturizers for vaginal dryness.  Group therapy is sometimes very helpful.  Acupuncture may be helpful in some cases. Contact a health care provider if:  You are not sure you are in menopause.  You are having menopausal symptoms and need advice and treatment.  You are still having menstrual periods after age 55 years.  You have pain with intercourse.  Menopause is complete (no menstrual period for 12 months) and you develop vaginal bleeding.  You need a referral to a specialist (gynecologist, psychiatrist, or psychologist) for treatment. Get help right   away if:  You have severe depression.  You have excessive vaginal bleeding.  You fell and think you have a  broken bone.  You have pain when you urinate.  You develop leg or chest pain.  You have a fast pounding heart beat (palpitations).  You have severe headaches.  You develop vision problems.  You feel a lump in your breast.  You have abdominal pain or severe indigestion. This information is not intended to replace advice given to you by your health care provider. Make sure you discuss any questions you have with your health care provider. Document Released: 02/20/2004 Document Revised: 05/07/2016 Document Reviewed: 06/29/2013 Elsevier Interactive Patient Education  2017 Elsevier Inc.  

## 2018-07-18 LAB — CYTOLOGY - PAP
DIAGNOSIS: NEGATIVE
HPV: NOT DETECTED

## 2019-10-09 ENCOUNTER — Ambulatory Visit: Payer: BC Managed Care – PPO | Admitting: Obstetrics

## 2019-10-19 ENCOUNTER — Ambulatory Visit: Payer: BC Managed Care – PPO | Admitting: Obstetrics

## 2019-10-31 ENCOUNTER — Encounter: Payer: Self-pay | Admitting: Podiatry

## 2019-10-31 ENCOUNTER — Other Ambulatory Visit: Payer: Self-pay

## 2019-10-31 ENCOUNTER — Ambulatory Visit: Payer: BC Managed Care – PPO | Admitting: Podiatry

## 2019-10-31 VITALS — BP 125/76 | HR 105

## 2019-10-31 DIAGNOSIS — M79675 Pain in left toe(s): Secondary | ICD-10-CM | POA: Diagnosis not present

## 2019-10-31 DIAGNOSIS — M2011 Hallux valgus (acquired), right foot: Secondary | ICD-10-CM

## 2019-10-31 DIAGNOSIS — J329 Chronic sinusitis, unspecified: Secondary | ICD-10-CM | POA: Insufficient documentation

## 2019-10-31 DIAGNOSIS — B351 Tinea unguium: Secondary | ICD-10-CM

## 2019-10-31 DIAGNOSIS — R8290 Unspecified abnormal findings in urine: Secondary | ICD-10-CM | POA: Insufficient documentation

## 2019-10-31 DIAGNOSIS — E1142 Type 2 diabetes mellitus with diabetic polyneuropathy: Secondary | ICD-10-CM

## 2019-10-31 DIAGNOSIS — M79674 Pain in right toe(s): Secondary | ICD-10-CM | POA: Diagnosis not present

## 2019-10-31 DIAGNOSIS — L84 Corns and callosities: Secondary | ICD-10-CM

## 2019-10-31 DIAGNOSIS — M2012 Hallux valgus (acquired), left foot: Secondary | ICD-10-CM

## 2019-10-31 NOTE — Patient Instructions (Signed)
Diabetes Mellitus and Foot Care Foot care is an important part of your health, especially when you have diabetes. Diabetes may cause you to have problems because of poor blood flow (circulation) to your feet and legs, which can cause your skin to:  Become thinner and drier.  Break more easily.  Heal more slowly.  Peel and crack. You may also have nerve damage (neuropathy) in your legs and feet, causing decreased feeling in them. This means that you may not notice minor injuries to your feet that could lead to more serious problems. Noticing and addressing any potential problems early is the best way to prevent future foot problems. How to care for your feet Foot hygiene  Wash your feet daily with warm water and mild soap. Do not use hot water. Then, pat your feet and the areas between your toes until they are completely dry. Do not soak your feet as this can dry your skin.  Trim your toenails straight across. Do not dig under them or around the cuticle. File the edges of your nails with an emery board or nail file.  Apply a moisturizing lotion or petroleum jelly to the skin on your feet and to dry, brittle toenails. Use lotion that does not contain alcohol and is unscented. Do not apply lotion between your toes. Shoes and socks  Wear clean socks or stockings every day. Make sure they are not too tight. Do not wear knee-high stockings since they may decrease blood flow to your legs.  Wear shoes that fit properly and have enough cushioning. Always look in your shoes before you put them on to be sure there are no objects inside.  To break in new shoes, wear them for just a few hours a day. This prevents injuries on your feet. Wounds, scrapes, corns, and calluses  Check your feet daily for blisters, cuts, bruises, sores, and redness. If you cannot see the bottom of your feet, use a mirror or ask someone for help.  Do not cut corns or calluses or try to remove them with medicine.  If you  find a minor scrape, cut, or break in the skin on your feet, keep it and the skin around it clean and dry. You may clean these areas with mild soap and water. Do not clean the area with peroxide, alcohol, or iodine.  If you have a wound, scrape, corn, or callus on your foot, look at it several times a day to make sure it is healing and not infected. Check for: ? Redness, swelling, or pain. ? Fluid or blood. ? Warmth. ? Pus or a bad smell. General instructions  Do not cross your legs. This may decrease blood flow to your feet.  Do not use heating pads or hot water bottles on your feet. They may burn your skin. If you have lost feeling in your feet or legs, you may not know this is happening until it is too late.  Protect your feet from hot and cold by wearing shoes, such as at the beach or on hot pavement.  Schedule a complete foot exam at least once a year (annually) or more often if you have foot problems. If you have foot problems, report any cuts, sores, or bruises to your health care provider immediately. Contact a health care provider if:  You have a medical condition that increases your risk of infection and you have any cuts, sores, or bruises on your feet.  You have an injury that is not   healing.  You have redness on your legs or feet.  You feel burning or tingling in your legs or feet.  You have pain or cramps in your legs and feet.  Your legs or feet are numb.  Your feet always feel cold.  You have pain around a toenail. Get help right away if:  You have a wound, scrape, corn, or callus on your foot and: ? You have pain, swelling, or redness that gets worse. ? You have fluid or blood coming from the wound, scrape, corn, or callus. ? Your wound, scrape, corn, or callus feels warm to the touch. ? You have pus or a bad smell coming from the wound, scrape, corn, or callus. ? You have a fever. ? You have a red line going up your leg. Summary  Check your feet every day  for cuts, sores, red spots, swelling, and blisters.  Moisturize feet and legs daily.  Wear shoes that fit properly and have enough cushioning.  If you have foot problems, report any cuts, sores, or bruises to your health care provider immediately.  Schedule a complete foot exam at least once a year (annually) or more often if you have foot problems. This information is not intended to replace advice given to you by your health care provider. Make sure you discuss any questions you have with your health care provider. Document Released: 11/27/2000 Document Revised: 01/12/2018 Document Reviewed: 01/01/2017 Elsevier Patient Education  2020 Elsevier Inc.  

## 2019-11-08 NOTE — Progress Notes (Signed)
Subjective: Erica [name] presents today referred by Primus BravoGibson, Tiffany, NP for diabetic foot evaluation.  Patient relates five year history of diabetes.  Patient denies any history of foot wounds.  Patient admits numbness in feet and toes feel cold at times.  She takes gabapentin 300 mg three times daily.  She denies any history of tingling, burning, pins/needles sensations.  Today, patient c/o of painful, discolored, thick toenails which interfere with daily activities.  Pain is aggravated when wearing enclosed shoe gear.   Past Medical History:  Diagnosis Date  . Arthritis   . Depression   . Diabetes mellitus   . Fatty liver   . Hernia, abdominal   . Hypercholesteremia   . IBS (irritable bowel syndrome)     Patient Active Problem List   Diagnosis Date Noted  . Sinusitis 10/31/2019  . Urine finding 10/31/2019  . Microalbuminuria 05/27/2018  . Hyperlipidemia 05/24/2017  . Inflammatory disorder of digestive tract 05/24/2017  . Low back pain 05/24/2017  . Type 2 diabetes mellitus with neurologic complication, without long-term current use of insulin (HCC) 05/24/2017  . Vitamin D deficiency 05/24/2017  . Otalgia of both ears 06/09/2016  . Temporomandibular joint (TMJ) pain 06/09/2016  . 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 Obesity 03/21/2015    Past Surgical History:  Procedure Laterality Date  . CESAREAN SECTION    . CHOLECYSTECTOMY      Current Outpatient Medications on File Prior to Visit  Medication Sig Dispense Refill  . busPIRone (BUSPAR) 7.5 MG tablet buspirone 7.5 mg tablet    . escitalopram (LEXAPRO) 10 MG tablet Take by mouth.    . Fluocinolone Acetonide Scalp 0.01 % OIL Apply at bedtime x 2 weeks, then decrease to every other night    . liraglutide (VICTOZA) 18 MG/3ML SOPN Inject into the skin.    Marland Kitchen. traZODone (DESYREL) 100 MG tablet Take by mouth.    Marland Kitchen. amitriptyline (ELAVIL) 50 MG tablet amitriptyline 50 mg tablet  Take 1 tablet(s) every day by  oral route as directed for 30 days.    Marland Kitchen. aspirin 81 MG tablet Take 81 mg by mouth at bedtime.     Marland Kitchen. atorvastatin (LIPITOR) 20 MG tablet Take 20 mg by mouth at bedtime.     Marland Kitchen. buPROPion (WELLBUTRIN XL) 150 MG 24 hr tablet bupropion HCl XL 150 mg 24 hr tablet, extended release  Take one tablet (150 mg total) by mouth every morning.    . gabapentin (NEURONTIN) 300 MG capsule Take 300 mg by mouth.    Marland Kitchen. HYDROcodone-acetaminophen (NORCO) 5-325 MG tablet Norco 5 mg-325 mg tablet  Take 1 tablet po QID prn pain    . ketoconazole (NIZORAL) 2 % shampoo APPLY TOPICALLY TWICE A WEEK FOR 30 DAYS    . ketorolac (TORADOL) 60 MG/2ML SOLN injection ketorolac 60 mg/2 mL intramuscular solution  Inject 60mg  IM x 1    . lisinopril (PRINIVIL,ZESTRIL) 5 MG tablet Take by mouth.    . meloxicam (MOBIC) 15 MG tablet Take 1 tablet (15 mg total) by mouth daily. 30 tablet 0  . methocarbamol (ROBAXIN) 500 MG tablet methocarbamol 500 mg tablet    . ondansetron (ZOFRAN) 8 MG tablet SMARTSIG:1 Tablet(s) By Mouth Every 12 Hours PRN    . oxyCODONE (OXY IR/ROXICODONE) 5 MG immediate release tablet oxycodone 5 mg tablet  Take 1 tablet(s) every 4 hours by oral route as needed for 10 days.    . sitaGLIPtin-metformin (JANUMET) 50-1000 MG tablet Take 1 tablet by mouth 2 (two)  times daily with a meal.    . Vitamin D, Ergocalciferol, (DRISDOL) 50000 UNITS CAPS capsule Take 50,000 Units by mouth every 7 (seven) days.     No current facility-administered medications on file prior to visit.      Allergies  Allergen Reactions  . Penicillins     Reported from childhood    Social History   Occupational History  . Not on file  Tobacco Use  . Smoking status: Never Smoker  . Smokeless tobacco: Never Used  Substance and Sexual Activity  . Alcohol use: No  . Drug use: No  . Sexual activity: Not Currently    Birth control/protection: None    Family History  Problem Relation Age of Onset  . Hypertension Mother   . Heart failure  Mother   . Fibroids Mother   . Fibroids Sister   . Fibroids Maternal Grandmother   . Diabetes type II Paternal Grandmother      There is no immunization history on file for this patient.  Review of systems: Positive Findings in bold print.  Constitutional:  chills, fatigue, fever, sweats, weight change Communication: Nurse, learning disability, sign Presenter, broadcasting, hand writing, iPad/Android device Head: headaches, head injury Eyes: changes in vision, eye pain, glaucoma, cataracts, macular degeneration, diplopia, glare,  light sensitivity, eyeglasses or contacts, blindness Ears nose mouth throat: hearing impaired, hearing aids,  ringing in ears, deaf, sign language,  vertigo, nosebleeds,  rhinitis,  cold sores, snoring, swollen glands, sinusitis Cardiovascular: HTN, edema, arrhythmia, pacemaker in place, defibrillator in place, chest pain/tightness, chronic anticoagulation, blood clot, heart failure, MI Peripheral Vascular: leg cramps, varicose veins, blood clots, lymphedema, varicosities Respiratory:  difficulty breathing, denies congestion, SOB, wheezing, cough, emphysema Gastrointestinal: change in appetite or weight, abdominal pain, constipation, diarrhea, nausea, vomiting, vomiting blood, change in bowel habits, abdominal pain, jaundice, rectal bleeding, hemorrhoids, GERD Genitourinary:  nocturia,  pain on urination, polyuria,  blood in urine, Foley catheter, urinary urgency, ESRD on hemodialysis Musculoskeletal: amputation, cramping, stiff joints, painful joints, decreased joint motion, fractures, OA, gout, hemiplegia, paraplegia, uses cane, wheelchair bound, uses walker, uses rollator Skin: +changes in toenails, color change, dryness, itching, mole changes,  rash, wound(s) Neurological: headaches, numbness in feet, paresthesias in feet, burning in feet, fainting,  seizures, change in speech,  headaches, memory problems/poor historian, cerebral palsy, weakness, paralysis, CVA, TIA Endocrine:  diabetes, hypothyroidism, hyperthyroidism,  goiter, dry mouth, flushing, heat intolerance,  cold intolerance,  excessive thirst, denies polyuria,  nocturia Hematological:  easy bleeding, excessive bleeding, easy bruising, enlarged lymph nodes, on long term blood thinner, history of past transusions Allergy/immunological:  hives, eczema, frequent infections, multiple drug allergies, seasonal allergies, transplant recipient, multiple food allergies Psychiatric:  anxiety, depression, mood disorder, suicidal ideations, hallucinations, insomnia  Objective: Vitals:   10/31/19 1623  BP: 125/76  Pulse: (!) 105   Vascular Examination: Capillary refill time <3 seconds b/l.  Dorsalis pedis pulses palpable b/l.  Posterior tibial pulses faintly palpable b/l.  Digital hair sparse x 10 digits.  Skin temperature gradient WNL b/l  Dermatological Examination: Skin with normal turgor, texture and tone b/l.  Toenails 1-5 left, 1, 2, 3, 5 left discolored, thick, dystrophic with subungual debris and pain with palpation to nailbeds due to thickness of nails.  There is noted onchyolysis of entire nailplate of the right 4th digit.  The nailbed remains intact. Thee is no erythema, no edema, no drainage, no underlying flocculence.  Hyperkeratotic lesion submet head 2 right foot with tenderness to palpation. No edema, no erythema, no  drainage, no flocculence.  Musculoskeletal: Muscle strength 5/5 to all LE muscle groups b/l.  HAV with bunion b/l.  Mild hammertoe deformity right 2nd digit.  Neurological: Sensation intact 5/5 b/l with 10 gram monofilament.  Vibratory sensation intact b/l.  Assessment: 1. Painful onychomycosis toenails 1-5 left, 1, 2, 3, 5 left  2. NIDDM  Plan: 1. Discussed diabetic foot care principles. Literature dispensed on today. 2. Toenails 1-5 left, 1, 2, 3, 5 left were debrided in length and girth without iatrogenic bleeding. Right 4th digit nailplate gently debrided from  it's remaining attachment to digit. Nailbed cleansed with alcohol. Triple antibiotic ointment applied. No further treatment required. 3. Patient to continue soft, supportive shoe gear daily. 4. Patient to report any pedal injuries to medical professional immediately. 5. Follow up 3 months.  6. Patient/POA to call should there be a concern in the interim.

## 2020-01-30 ENCOUNTER — Ambulatory Visit (INDEPENDENT_AMBULATORY_CARE_PROVIDER_SITE_OTHER): Payer: Self-pay

## 2020-01-30 ENCOUNTER — Ambulatory Visit: Payer: BC Managed Care – PPO | Admitting: Podiatry

## 2020-01-30 ENCOUNTER — Ambulatory Visit: Payer: Self-pay

## 2020-01-30 ENCOUNTER — Other Ambulatory Visit: Payer: Self-pay

## 2020-01-30 DIAGNOSIS — S91331A Puncture wound without foreign body, right foot, initial encounter: Secondary | ICD-10-CM

## 2020-01-30 DIAGNOSIS — L84 Corns and callosities: Secondary | ICD-10-CM

## 2020-01-30 DIAGNOSIS — S90851A Superficial foreign body, right foot, initial encounter: Secondary | ICD-10-CM

## 2020-01-30 DIAGNOSIS — E1142 Type 2 diabetes mellitus with diabetic polyneuropathy: Secondary | ICD-10-CM

## 2020-01-30 MED ORDER — MUPIROCIN 2 % EX OINT: TOPICAL_OINTMENT | CUTANEOUS | 1 refills | Status: DC

## 2020-01-30 MED ORDER — DOXYCYCLINE HYCLATE 100 MG PO CAPS: 100.0000 mg | ORAL_CAPSULE | Freq: Two times a day (BID) | ORAL | 0 refills | Status: AC

## 2020-01-30 NOTE — Patient Instructions (Signed)
Diabetes Mellitus and Foot Care Foot care is an important part of your health, especially when you have diabetes. Diabetes may cause you to have problems because of poor blood flow (circulation) to your feet and legs, which can cause your skin to:  Become thinner and drier.  Break more easily.  Heal more slowly.  Peel and crack. You may also have nerve damage (neuropathy) in your legs and feet, causing decreased feeling in them. This means that you may not notice minor injuries to your feet that could lead to more serious problems. Noticing and addressing any potential problems early is the best way to prevent future foot problems. How to care for your feet Foot hygiene  Wash your feet daily with warm water and mild soap. Do not use hot water. Then, pat your feet and the areas between your toes until they are completely dry. Do not soak your feet as this can dry your skin.  Trim your toenails straight across. Do not dig under them or around the cuticle. File the edges of your nails with an emery board or nail file.  Apply a moisturizing lotion or petroleum jelly to the skin on your feet and to dry, brittle toenails. Use lotion that does not contain alcohol and is unscented. Do not apply lotion between your toes. Shoes and socks  Wear clean socks or stockings every day. Make sure they are not too tight. Do not wear knee-high stockings since they may decrease blood flow to your legs.  Wear shoes that fit properly and have enough cushioning. Always look in your shoes before you put them on to be sure there are no objects inside.  To break in new shoes, wear them for just a few hours a day. This prevents injuries on your feet. Wounds, scrapes, corns, and calluses  Check your feet daily for blisters, cuts, bruises, sores, and redness. If you cannot see the bottom of your feet, use a mirror or ask someone for help.  Do not cut corns or calluses or try to remove them with medicine.  If you  find a minor scrape, cut, or break in the skin on your feet, keep it and the skin around it clean and dry. You may clean these areas with mild soap and water. Do not clean the area with peroxide, alcohol, or iodine.  If you have a wound, scrape, corn, or callus on your foot, look at it several times a day to make sure it is healing and not infected. Check for: ? Redness, swelling, or pain. ? Fluid or blood. ? Warmth. ? Pus or a bad smell. General instructions  Do not cross your legs. This may decrease blood flow to your feet.  Do not use heating pads or hot water bottles on your feet. They may burn your skin. If you have lost feeling in your feet or legs, you may not know this is happening until it is too late.  Protect your feet from hot and cold by wearing shoes, such as at the beach or on hot pavement.  Schedule a complete foot exam at least once a year (annually) or more often if you have foot problems. If you have foot problems, report any cuts, sores, or bruises to your health care provider immediately. Contact a health care provider if:  You have a medical condition that increases your risk of infection and you have any cuts, sores, or bruises on your feet.  You have an injury that is not   healing.  You have redness on your legs or feet.  You feel burning or tingling in your legs or feet.  You have pain or cramps in your legs and feet.  Your legs or feet are numb.  Your feet always feel cold.  You have pain around a toenail. Get help right away if:  You have a wound, scrape, corn, or callus on your foot and: ? You have pain, swelling, or redness that gets worse. ? You have fluid or blood coming from the wound, scrape, corn, or callus. ? Your wound, scrape, corn, or callus feels warm to the touch. ? You have pus or a bad smell coming from the wound, scrape, corn, or callus. ? You have a fever. ? You have a red line going up your leg. Summary  Check your feet every day  for cuts, sores, red spots, swelling, and blisters.  Moisturize feet and legs daily.  Wear shoes that fit properly and have enough cushioning.  If you have foot problems, report any cuts, sores, or bruises to your health care provider immediately.  Schedule a complete foot exam at least once a year (annually) or more often if you have foot problems. This information is not intended to replace advice given to you by your health care provider. Make sure you discuss any questions you have with your health care provider. Document Revised: 08/23/2019 Document Reviewed: 01/01/2017 Elsevier Patient Education  2020 [employer]  

## 2020-01-31 ENCOUNTER — Encounter: Payer: Self-pay | Admitting: Podiatry

## 2020-01-31 NOTE — Progress Notes (Signed)
Subjective: Erica [name] presents today for follow up of preventative diabetic foot care, painful callus(es) b/l feet. Aggravating factors include weightbearing with and without shoe gear. Pain is relieved with periodic professional debridement. and for suspected foreign body object of right foot foot.  Patient states in December, she noticed blood coming from her right heel. She looked at her shoe and noticed a piece of metal had penetrated her shoe and cut her heel. She nor her husband could remove the metal object, so she threw the shoe away.  She has been cleaning periodically with peroxide and applying antibiotic ointment. She has noticed the heel has not healed and has become tender with slight swelling. She denies any fever, chills, night sweats, nausea or vomiting.   Allergies  Allergen Reactions  . Penicillins     Reported from childhood    Objective: Oral Temperature 97.3 degrees Fahrenheit  Vascular Examination:  Capillary fill time to digits <3s b/l, palpable DP pulses b/l, faintly palpable PT pulses b/l, non-palpable PT pulses b/l, pedal hair sparse b/l and skin temperature gradient within normal limits b/l  Dermatological Examination: Pedal skin with normal turgor, texture and tone bilaterally, no interdigital macerations bilaterally and hyperkeratotic lesion(s) submet head 2 b/l.  No erythema, no edema, no drainage, no flocculence.  Puncture wound noted plantar aspect of right foot. Unable to appreciate foreign body during inspection. No purulence. Mild odor. No tunneling into deep tissues. No underlying abscess.       Musculoskeletal: Normal muscle strength 5/5 to all lower extremity muscle groups bilaterally, no pain crepitus or joint limitation noted with ROM b/l, bunion deformity noted b/l and hammertoes noted to the  R 2nd toe.  Pain on palpation right plantar heel.  Neurological: Protective sensation intact 5/5 intact bilaterally with 10g monofilament b/l  and vibratory sensation intact b/l   Xray right foot: No evidence of gas in tissue Suspicious object inferior to calcaneal spur, but most likely osteophyte Plantar and posterior calcaneal spurring  Assessment: 1. Foreign body in right foot, initial encounter   2. Puncture wound of right foot, initial encounter   3. Callus   4. Diabetic peripheral neuropathy associated with type 2 diabetes mellitus (HCC)    Plan: -Continue diabetic foot care principles. Literature dispensed on today.  -Calluses were debrided without complication or incident. Total number debrided =2 right foot -Betadine prep to right foot; inspected wound and no foreign body appreciated. Irrigated with alcohol. TAO and band-aid applied. -Culture and sensitivity of wound taken today. -Patient to continue soft, supportive shoe gear daily. -Patient was advised to update tetanus with PCP.  -Prescription written for Mupirocin Ointment. Patient is to apply to right heel once daily and cover with dressing.  -Rx for Doxycyline 100 mg, #14, to be taken BID for 7 days. -Patient to report any pedal injuries to medical professional immediately. -Xray of right foot was performed and reviewed with patient and/or POA. -Patient instructed to report to emergency department with worsening appearance of right foot, increased pain, foul odor, increased redness, swelling, drainage, fever, chills, nightsweats, nausea, vomiting, increased blood sugar.   -Patient/POA to call should there be question/concern in the interim.  Return in about 1 week (around 02/06/2020) for diabetic ulcer, right heel.

## 2020-02-03 LAB — WOUND CULTURE
MICRO NUMBER:: 10155995
SPECIMEN QUALITY:: ADEQUATE

## 2020-02-06 ENCOUNTER — Ambulatory Visit: Payer: BC Managed Care – PPO | Admitting: Podiatry

## 2020-02-06 ENCOUNTER — Other Ambulatory Visit: Payer: Self-pay

## 2020-02-06 VITALS — Temp 98.2°F

## 2020-02-06 DIAGNOSIS — S91331A Puncture wound without foreign body, right foot, initial encounter: Secondary | ICD-10-CM | POA: Diagnosis not present

## 2020-02-06 DIAGNOSIS — S91331D Puncture wound without foreign body, right foot, subsequent encounter: Secondary | ICD-10-CM | POA: Diagnosis not present

## 2020-02-06 NOTE — Patient Instructions (Signed)
Diabetes Mellitus and Foot Care Foot care is an important part of your health, especially when you have diabetes. Diabetes may cause you to have problems because of poor blood flow (circulation) to your feet and legs, which can cause your skin to:  Become thinner and drier.  Break more easily.  Heal more slowly.  Peel and crack. You may also have nerve damage (neuropathy) in your legs and feet, causing decreased feeling in them. This means that you may not notice minor injuries to your feet that could lead to more serious problems. Noticing and addressing any potential problems early is the best way to prevent future foot problems. How to care for your feet Foot hygiene  Wash your feet daily with warm water and mild soap. Do not use hot water. Then, pat your feet and the areas between your toes until they are completely dry. Do not soak your feet as this can dry your skin.  Trim your toenails straight across. Do not dig under them or around the cuticle. File the edges of your nails with an emery board or nail file.  Apply a moisturizing lotion or petroleum jelly to the skin on your feet and to dry, brittle toenails. Use lotion that does not contain alcohol and is unscented. Do not apply lotion between your toes. Shoes and socks  Wear clean socks or stockings every day. Make sure they are not too tight. Do not wear knee-high stockings since they may decrease blood flow to your legs.  Wear shoes that fit properly and have enough cushioning. Always look in your shoes before you put them on to be sure there are no objects inside.  To break in new shoes, wear them for just a few hours a day. This prevents injuries on your feet. Wounds, scrapes, corns, and calluses  Check your feet daily for blisters, cuts, bruises, sores, and redness. If you cannot see the bottom of your feet, use a mirror or ask someone for help.  Do not cut corns or calluses or try to remove them with medicine.  If you  find a minor scrape, cut, or break in the skin on your feet, keep it and the skin around it clean and dry. You may clean these areas with mild soap and water. Do not clean the area with peroxide, alcohol, or iodine.  If you have a wound, scrape, corn, or callus on your foot, look at it several times a day to make sure it is healing and not infected. Check for: ? Redness, swelling, or pain. ? Fluid or blood. ? Warmth. ? Pus or a bad smell. General instructions  Do not cross your legs. This may decrease blood flow to your feet.  Do not use heating pads or hot water bottles on your feet. They may burn your skin. If you have lost feeling in your feet or legs, you may not know this is happening until it is too late.  Protect your feet from hot and cold by wearing shoes, such as at the beach or on hot pavement.  Schedule a complete foot exam at least once a year (annually) or more often if you have foot problems. If you have foot problems, report any cuts, sores, or bruises to your health care provider immediately. Contact a health care provider if:  You have a medical condition that increases your risk of infection and you have any cuts, sores, or bruises on your feet.  You have an injury that is not   healing.  You have redness on your legs or feet.  You feel burning or tingling in your legs or feet.  You have pain or cramps in your legs and feet.  Your legs or feet are numb.  Your feet always feel cold.  You have pain around a toenail. Get help right away if:  You have a wound, scrape, corn, or callus on your foot and: ? You have pain, swelling, or redness that gets worse. ? You have fluid or blood coming from the wound, scrape, corn, or callus. ? Your wound, scrape, corn, or callus feels warm to the touch. ? You have pus or a bad smell coming from the wound, scrape, corn, or callus. ? You have a fever. ? You have a red line going up your leg. Summary  Check your feet every day  for cuts, sores, red spots, swelling, and blisters.  Moisturize feet and legs daily.  Wear shoes that fit properly and have enough cushioning.  If you have foot problems, report any cuts, sores, or bruises to your health care provider immediately.  Schedule a complete foot exam at least once a year (annually) or more often if you have foot problems. This information is not intended to replace advice given to you by your health care provider. Make sure you discuss any questions you have with your health care provider. Document Revised: 08/23/2019 Document Reviewed: 01/01/2017 Elsevier Patient Education  2020 [employer]  

## 2020-02-08 ENCOUNTER — Encounter: Payer: Self-pay | Admitting: Podiatry

## 2020-02-08 DIAGNOSIS — S91331A Puncture wound without foreign body, right foot, initial encounter: Secondary | ICD-10-CM | POA: Insufficient documentation

## 2020-02-08 NOTE — Progress Notes (Signed)
Subjective: Erica [name] presents today for follow up of follow up puncture wound right heel secondary to foreign body. She states her foot is feeling better. She denies any fever, chills, nightsweats, nausea or vomiting. She did get her updated tetanus vaccination at her PCPs office.   She also states she has about 3-4 doxycycline capsules left.   Allergies  Allergen Reactions  . Penicillins     Reported from childhood     Objective: Vitals:   02/06/20 1451  Temp: 98.2 F (36.8 C)    Vascular Examination:  Capillary fill time to digits <3s b/l, palpable DP pulses b/l, faintly palpable PT pulses b/l, pedal hair sparse b/l and skin temperature gradient within normal limits b/l  Dermatological Examination: Pedal skin with normal turgor, texture and tone bilaterally and puncture wound is noted to be decreased in size and filling in nicely and measures 0.3 cm in diameter. No perilesional erythema, no edema, no drainage expressed. No odor. No tracking, no tunneling nor undermining.  Musculoskeletal: Normal muscle strength 5/5 to all lower extremity muscle groups bilaterally, no pain crepitus or joint limitation noted with ROM b/l, bunion deformity noted b/l and hammertoes noted to the  R 2nd toe  Neurological: Protective sensation intact 5/5 intact bilaterally with 10g monofilament b/l and vibratory sensation intact b/l   Recent Results (from the past 240 hour(s))  WOUND CULTURE     Status: Abnormal (Preliminary result)   Collection Time: 01/30/20  5:32 PM   Specimen: Foot, Right; Wound  Result Value Ref Range Status   MICRO NUMBER: [id number]  Preliminary   SPECIMEN QUALITY: Adequate  Preliminary   SOURCE: FOOT, RIGHT  Preliminary   STATUS: PRELIMINARY  Preliminary   GRAM STAIN:   Preliminary    No white blood cells seen No epithelial cells seen Few Gram positive cocci in pairs   ISOLATE 1: Group G Streptococcus (A)  Preliminary    Comment: Heavy growth of Group G  Streptococcus , susceptibility test report to follow.    Assessment: 1. Puncture wound of right foot, subsequent encounter    Plan: -Right foot puncture wound debrided. Cleansed with wound cleanser. Silvadene Cream and light dressing applied. Continue Mupirocin ointment dressings once daily. Complete all doxycycline capsules.  -Reviewed culture results with Erica [name]. Awaiting susceptibility -Pt to notify report to ED with any redness, swelling, drainage, fever, chills, night sweats, nausea or vomiting. -Patient to continue soft, supportive shoe gear daily. -Patient to report any pedal injuries to medical professional immediately. -Patient/POA to call should there be question/concern in the interim.  Return in about 2 weeks (around 02/20/2020) for diabetic ulcer right heel.

## 2020-02-13 ENCOUNTER — Telehealth: Payer: Self-pay | Admitting: *Deleted

## 2020-02-13 ENCOUNTER — Telehealth (INDEPENDENT_AMBULATORY_CARE_PROVIDER_SITE_OTHER): Payer: BC Managed Care – PPO | Admitting: Podiatry

## 2020-02-13 DIAGNOSIS — E1142 Type 2 diabetes mellitus with diabetic polyneuropathy: Secondary | ICD-10-CM

## 2020-02-13 DIAGNOSIS — S91331D Puncture wound without foreign body, right foot, subsequent encounter: Secondary | ICD-10-CM

## 2020-02-13 DIAGNOSIS — S90851A Superficial foreign body, right foot, initial encounter: Secondary | ICD-10-CM

## 2020-02-13 NOTE — Telephone Encounter (Signed)
Pt states Dr. Eloy End was to send an antibiotic to the pharmacy.

## 2020-02-13 NOTE — Telephone Encounter (Signed)
Phoned patient with sensitivity results from wound culture. Optimal sensitivities with PCNs which she is allergic to.   She has h/o IBS, so I am hesitant to place her on Clindamycin.  Clinically, her foot was not infected on last visit, so we will continue with Mupirocin Ointment to wound daily. She will keep her scheduled appointment on 02/21/2020, and contact me if she has any problems with her foot.

## 2020-02-13 NOTE — Telephone Encounter (Signed)
I spoke with pt, she states Dr. Eloy End was waiting on a report to tell her what antibiotic to use, because she is allergic to penicillin.

## 2020-02-21 ENCOUNTER — Ambulatory Visit: Payer: BC Managed Care – PPO | Admitting: Podiatry

## 2020-02-27 ENCOUNTER — Ambulatory Visit (INDEPENDENT_AMBULATORY_CARE_PROVIDER_SITE_OTHER): Payer: BC Managed Care – PPO | Admitting: Podiatry

## 2020-02-27 ENCOUNTER — Other Ambulatory Visit: Payer: Self-pay

## 2020-02-27 ENCOUNTER — Encounter: Payer: Self-pay | Admitting: Podiatry

## 2020-02-27 VITALS — Temp 96.8°F

## 2020-02-27 DIAGNOSIS — E11621 Type 2 diabetes mellitus with foot ulcer: Secondary | ICD-10-CM | POA: Diagnosis not present

## 2020-02-27 DIAGNOSIS — S91331D Puncture wound without foreign body, right foot, subsequent encounter: Secondary | ICD-10-CM | POA: Diagnosis not present

## 2020-02-27 DIAGNOSIS — L97411 Non-pressure chronic ulcer of right heel and midfoot limited to breakdown of skin: Secondary | ICD-10-CM | POA: Diagnosis not present

## 2020-02-27 NOTE — Patient Instructions (Addendum)
DRESSING CHANGES right FOOT:     1. KEEP right FOOT DRY AT ALL TIMES!!!!  2. CLEANSE ULCER WITH SALINE.  3. DAB DRY WITH GAUZE SPONGE.  4. APPLY A LIGHT AMOUNT OF Mupirocin Ointment TO BASE OF ULCER.  5. APPLY BAND-AID AS INSTRUCTED.  6. DO NOT WALK BAREFOOT!!!  7.  IF YOU EXPERIENCE ANY FEVER, CHILLS, NIGHTSWEATS, NAUSEA OR VOMITING, ELEVATED OR LOW BLOOD SUGARS, REPORT TO EMERGENCY ROOM.  8. IF YOU EXPERIENCE INCREASED REDNESS, PAIN, SWELLING, DISCOLORATION, ODOR, PUS, DRAINAGE OR WARMTH OF YOUR FOOT, REPORT TO EMERGENCY ROOM.  Diabetes Mellitus and Foot Care Foot care is an important part of your health, especially when you have diabetes. Diabetes may cause you to have problems because of poor blood flow (circulation) to your feet and legs, which can cause your skin to:  Become thinner and drier.  Break more easily.  Heal more slowly.  Peel and crack. You may also have nerve damage (neuropathy) in your legs and feet, causing decreased feeling in them. This means that you may not notice minor injuries to your feet that could lead to more serious problems. Noticing and addressing any potential problems early is the best way to prevent future foot problems. How to care for your feet Foot hygiene  Wash your feet daily with warm water and mild soap. Do not use hot water. Then, pat your feet and the areas between your toes until they are completely dry. Do not soak your feet as this can dry your skin.  Trim your toenails straight across. Do not dig under them or around the cuticle. File the edges of your nails with an emery board or nail file.  Apply a moisturizing lotion or petroleum jelly to the skin on your feet and to dry, brittle toenails. Use lotion that does not contain alcohol and is unscented. Do not apply lotion between your toes. Shoes and socks  Wear clean socks or stockings every day. Make sure they are not too tight. Do not wear knee-high stockings since they  may decrease blood flow to your legs.  Wear shoes that fit properly and have enough cushioning. Always look in your shoes before you put them on to be sure there are no objects inside.  To break in new shoes, wear them for just a few hours a day. This prevents injuries on your feet. Wounds, scrapes, corns, and calluses  Check your feet daily for blisters, cuts, bruises, sores, and redness. If you cannot see the bottom of your feet, use a mirror or ask someone for help.  Do not cut corns or calluses or try to remove them with medicine.  If you find a minor scrape, cut, or break in the skin on your feet, keep it and the skin around it clean and dry. You may clean these areas with mild soap and water. Do not clean the area with peroxide, alcohol, or iodine.  If you have a wound, scrape, corn, or callus on your foot, look at it several times a day to make sure it is healing and not infected. Check for: ? Redness, swelling, or pain. ? Fluid or blood. ? Warmth. ? Pus or a bad smell. General instructions  Do not cross your legs. This may decrease blood flow to your feet.  Do not use heating pads or hot water bottles on your feet. They may burn your skin. If you have lost feeling in your feet or legs, you may not know this is  happening until it is too late.  Protect your feet from hot and cold by wearing shoes, such as at the beach or on hot pavement.  Schedule a complete foot exam at least once a year (annually) or more often if you have foot problems. If you have foot problems, report any cuts, sores, or bruises to your health care provider immediately. Contact a health care provider if:  You have a medical condition that increases your risk of infection and you have any cuts, sores, or bruises on your feet.  You have an injury that is not healing.  You have redness on your legs or feet.  You feel burning or tingling in your legs or feet.  You have pain or cramps in your legs and  feet.  Your legs or feet are numb.  Your feet always feel cold.  You have pain around a toenail. Get help right away if:  You have a wound, scrape, corn, or callus on your foot and: ? You have pain, swelling, or redness that gets worse. ? You have fluid or blood coming from the wound, scrape, corn, or callus. ? Your wound, scrape, corn, or callus feels warm to the touch. ? You have pus or a bad smell coming from the wound, scrape, corn, or callus. ? You have a fever. ? You have a red line going up your leg. Summary  Check your feet every day for cuts, sores, red spots, swelling, and blisters.  Moisturize feet and legs daily.  Wear shoes that fit properly and have enough cushioning.  If you have foot problems, report any cuts, sores, or bruises to your health care provider immediately.  Schedule a complete foot exam at least once a year (annually) or more often if you have foot problems. This information is not intended to replace advice given to you by your health care provider. Make sure you discuss any questions you have with your health care provider. Document Revised: 08/23/2019 Document Reviewed: 01/01/2017 Elsevier Patient Education  2020 [employer].

## 2020-03-03 NOTE — Progress Notes (Signed)
  Subjective:  Patient ID: Erica [name], female    DOB: 1971-10-17,  MRN: [id number]  Chief Complaint  Patient presents with  . Diabetic Ulcer    right heel puncture wound, 2 week follow up    49 y.o. female presents for follow up right heel wound.  Hx confirmed with patient. States she has been applying Mupirocin Ointment to wound daily. She admits to showering.   I did review culture and sensitivity with her over the phone. Decision made not to introduce any more antibiotics due to her h/o IBS. She did complete her doxycycline.  She notes no erythema, no edema, no drainage, no odor, no pain. Denies any fever, chills, night sweats, nausea or vomiting.  Objective:  Physical Exam: Vitals:   02/27/20 1319  Temp: (!) 96.8 F (36 C)     50 y.o. AA  female obese in NAD. AAO x 3.  Capillary fill time to digits <3 seconds b/l. Palpable DP pulses b/l. Faintly palpable PT pulses b/l. Pedal hair present b/l. Skin temperature gradient within normal limits b/l.  Pedal skin with normal turgor, texture and tone bilaterally. No images are attached to the encounter. Wound Location: plantar right heel Wound Measurement:  Predebridement: 0.5 cm in diameter with hyperkeratotic perimeter and red, granular center. Postdebridment: 0.3 x 0.2 x 0.1 cm with red, granular base. No tracking, no tunneling, no drainage, no malodor Wound Base: Granular/Healthy Peri-wound: Calloused Exudate: None: wound tissue dry wound without warmth, erythema, signs of acute infection and appears improved compared to last recheck  Normal muscle strength 5/5 to all lower extremity muscle groups bilaterally, no pain crepitus or joint limitation noted with ROM b/l, bunion deformity noted b/l and hammertoes noted to the  R 2nd toe  Protective sensation intact 5/5 intact bilaterally with 10g monofilament b/l Vibratory sensation intact b/l  Assessment:   1. Diabetic ulcer of right heel associated with type 2 diabetes  mellitus, limited to breakdown of skin (HCC)   2. Puncture wound of right foot, subsequent encounter     Plan:  Plan: -Patient was evaluated and treated and all questions answered. -Ulcer was debrided of nonviable necrotic tissue and was resected to the level of subcutaneous tissue. Ulcer was cleansed with wound cleanser. Silvadene Cream was applied to base of wound with light dressing. Continue soft, supportive shoe gear daily.  -Will check benefits for orthotics or diabetic shoes. -Patient was given written instructions on offloading of ulceration and daily dressing changes. Strict orders were given to keep foot dry. Patient was instructed to call immediately if any signs or symptoms of infection arise.  -Patient instructed to report to emergency department with worsening appearance of ulcer/toe/foot, increased pain, foul odor, increased redness, swelling, drainage, fever, chills, nightsweats, nausea, vomiting, increased blood sugar.  -Patient/POA to call should there be question/concern in the interim.  Return in about 2 weeks (around 03/12/2020) for diabetic ulcer right heel.

## 2020-03-06 ENCOUNTER — Other Ambulatory Visit: Payer: Self-pay

## 2020-03-06 ENCOUNTER — Encounter: Payer: Self-pay | Admitting: Obstetrics

## 2020-03-06 ENCOUNTER — Ambulatory Visit (INDEPENDENT_AMBULATORY_CARE_PROVIDER_SITE_OTHER): Payer: BC Managed Care – PPO | Admitting: Obstetrics

## 2020-03-06 VITALS — BP 139/89 | HR 91 | Wt 284.0 lb

## 2020-03-06 DIAGNOSIS — Z1151 Encounter for screening for human papillomavirus (HPV): Secondary | ICD-10-CM

## 2020-03-06 DIAGNOSIS — Z124 Encounter for screening for malignant neoplasm of cervix: Secondary | ICD-10-CM | POA: Diagnosis not present

## 2020-03-06 DIAGNOSIS — Z3009 Encounter for other general counseling and advice on contraception: Secondary | ICD-10-CM | POA: Diagnosis not present

## 2020-03-06 DIAGNOSIS — Z01419 Encounter for gynecological examination (general) (routine) without abnormal findings: Secondary | ICD-10-CM | POA: Diagnosis not present

## 2020-03-06 DIAGNOSIS — Z113 Encounter for screening for infections with a predominantly sexual mode of transmission: Secondary | ICD-10-CM | POA: Diagnosis not present

## 2020-03-06 DIAGNOSIS — Z30011 Encounter for initial prescription of contraceptive pills: Secondary | ICD-10-CM

## 2020-03-06 DIAGNOSIS — N898 Other specified noninflammatory disorders of vagina: Secondary | ICD-10-CM

## 2020-03-06 DIAGNOSIS — E139 Other specified diabetes mellitus without complications: Secondary | ICD-10-CM

## 2020-03-06 MED ORDER — LO LOESTRIN FE 1 MG-10 MCG / 10 MCG PO TABS: 1.0000 | ORAL_TABLET | Freq: Every day | ORAL | 4 refills | Status: DC

## 2020-03-06 NOTE — Progress Notes (Signed)
Subjective:        Erica [name] is a 50 y.o. female here for a routine exam.  Current complaints: None.    Personal health questionnaire:  Is patient Ashkenazi Jewish, have a family history of breast and/or ovarian cancer: no Is there a family history of uterine cancer diagnosed at age < 72, gastrointestinal cancer, urinary tract cancer, family member who is a Personnel officer syndrome-associated carrier: no Is the patient overweight and hypertensive, family history of diabetes, personal history of gestational diabetes, preeclampsia or PCOS: no Is patient over 55, have PCOS,  family history of premature CHD under age 73, diabetes, smoke, have hypertension or peripheral artery disease:  no At any time, has a partner hit, kicked or otherwise hurt or frightened you?: no Over the past 2 weeks, have you felt down, depressed or hopeless?: no Over the past 2 weeks, have you felt little interest or pleasure in doing things?:no   Gynecologic History Patient's last menstrual period was 02/23/2020. Contraception: none Last Pap: 07-14-2018. Results were: normal Last mammogram: 2016. Results were: normal  Obstetric History OB History  Gravida Para Term Preterm AB Living  1         1  SAB TAB Ectopic Multiple Live Births          1    # Outcome Date GA Lbr Len/2nd Weight Sex Delivery Anes PTL Lv  1 Gravida 05/16/96    F CS-LTranv   LIV    Past Medical History:  Diagnosis Date  . Arthritis   . Depression   . Diabetes mellitus   . Fatty liver   . Hernia, abdominal   . Hypercholesteremia   . IBS (irritable bowel syndrome)     Past Surgical History:  Procedure Laterality Date  . CESAREAN SECTION    . CHOLECYSTECTOMY       Current Outpatient Medications:  .  amitriptyline (ELAVIL) 50 MG tablet, amitriptyline 50 mg tablet  Take 1 tablet(s) every day by oral route as directed for 30 days., Disp: , Rfl:  .  aspirin 81 MG tablet, Take 81 mg by mouth at bedtime. , Disp: , Rfl:  .   atorvastatin (LIPITOR) 20 MG tablet, Take 20 mg by mouth at bedtime. , Disp: , Rfl:  .  buPROPion (WELLBUTRIN XL) 150 MG 24 hr tablet, bupropion HCl XL 150 mg 24 hr tablet, extended release  Take one tablet (150 mg total) by mouth every morning., Disp: , Rfl:  .  busPIRone (BUSPAR) 7.5 MG tablet, buspirone 7.5 mg tablet, Disp: , Rfl:  .  escitalopram (LEXAPRO) 10 MG tablet, Take by mouth., Disp: , Rfl:  .  Fluocinolone Acetonide Scalp 0.01 % OIL, Apply at bedtime x 2 weeks, then decrease to every other night, Disp: , Rfl:  .  gabapentin (NEURONTIN) 300 MG capsule, Take 300 mg by mouth., Disp: , Rfl:  .  HYDROcodone-acetaminophen (NORCO) 5-325 MG tablet, Norco 5 mg-325 mg tablet  Take 1 tablet po QID prn pain, Disp: , Rfl:  .  ketoconazole (NIZORAL) 2 % shampoo, APPLY TOPICALLY TWICE A WEEK FOR 30 DAYS, Disp: , Rfl:  .  ketorolac (TORADOL) 60 MG/2ML SOLN injection, ketorolac 60 mg/2 mL intramuscular solution  Inject 60mg  IM x 1, Disp: , Rfl:  .  liraglutide (VICTOZA) 18 MG/3ML SOPN, Inject into the skin., Disp: , Rfl:  .  lisinopril (PRINIVIL,ZESTRIL) 5 MG tablet, Take by mouth., Disp: , Rfl:  .  LO LOESTRIN FE 1 MG-10 MCG / 10  MCG tablet, Take 1 tablet by mouth daily., Disp: 3 Package, Rfl: 4 .  meloxicam (MOBIC) 15 MG tablet, Take 1 tablet (15 mg total) by mouth daily., Disp: 30 tablet, Rfl: 0 .  methocarbamol (ROBAXIN) 500 MG tablet, methocarbamol 500 mg tablet, Disp: , Rfl:  .  mupirocin ointment (BACTROBAN) 2 %, Apply to right heel wound once daily., Disp: 30 g, Rfl: 1 .  ondansetron (ZOFRAN) 8 MG tablet, SMARTSIG:1 Tablet(s) By Mouth Every 12 Hours PRN, Disp: , Rfl:  .  oxyCODONE (OXY IR/ROXICODONE) 5 MG immediate release tablet, oxycodone 5 mg tablet  Take 1 tablet(s) every 4 hours by oral route as needed for 10 days., Disp: , Rfl:  .  sitaGLIPtin-metformin (JANUMET) 50-1000 MG tablet, Take 1 tablet by mouth 2 (two) times daily with a meal., Disp: , Rfl:  .  traZODone (DESYREL) 100 MG tablet,  Take by mouth., Disp: , Rfl:  .  Vitamin D, Ergocalciferol, (DRISDOL) 50000 UNITS CAPS capsule, Take 50,000 Units by mouth every 7 (seven) days., Disp: , Rfl:  Allergies  Allergen Reactions  . Penicillins     Reported from childhood    Social History   Tobacco Use  . Smoking status: Never Smoker  . Smokeless tobacco: Never Used  Substance Use Topics  . Alcohol use: No    Family History  Problem Relation Age of Onset  . Hypertension Mother   . Heart failure Mother   . Fibroids Mother   . Fibroids Sister   . Fibroids Maternal Grandmother   . Diabetes type II Paternal Grandmother       Review of Systems  Constitutional: negative for fatigue and weight loss Respiratory: negative for cough and wheezing Cardiovascular: negative for chest pain, fatigue and palpitations Gastrointestinal: negative for abdominal pain and change in bowel habits Musculoskeletal:negative for myalgias Neurological: negative for gait problems and tremors Behavioral/Psych: negative for abusive relationship, depression Endocrine: negative for temperature intolerance    Genitourinary:negative for abnormal menstrual periods, genital lesions, hot flashes, sexual problems and vaginal discharge Integument/breast: negative for breast lump, breast tenderness, nipple discharge and skin lesion(s)    Objective:       BP 139/89   Pulse 91   Wt 284 lb (128.8 kg)   LMP 02/23/2020   BMI 39.61 kg/m  General:   alert  Skin:   no rash or abnormalities  Lungs:   clear to auscultation bilaterally  Heart:   regular rate and rhythm, S1, S2 normal, no murmur, click, rub or gallop  Breasts:   bilateral cracking of nipples  Abdomen:  normal findings: no organomegaly, soft, non-tender and no hernia  Lab Review Urine pregnancy test Labs reviewed yes Radiologic studies reviewed no  50% of 15 min visit spent on counseling and coordination of care.   Assessment:     1. Encounter for routine gynecological  examination with Papanicolaou smear of cervix Rx: - Cytology - PAP - Cervicovaginal ancillary only  2. Vaginal discharge Rx: - Cervicovaginal ancillary only  3. General counseling and advice on female contraception - wants OCP's  4. Encounter for initial prescription of contraceptive pills Rx: - LO LOESTRIN FE 1 MG-10 MCG / 10 MCG tablet; Take 1 tablet by mouth daily.  Dispense: 3 Package; Refill: 4  5. 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  Contraception: OCP (estrogen/progesterone). Mammogram ordered.   Meds ordered this encounter  Medications  . LO LOESTRIN FE 1 MG-10 MCG / 10 MCG tablet    Sig: Take 1 tablet by mouth daily.    Dispense:  3 Package    Refill:  4    Submit other coverage code 3  BIN:  [id number]  PCN:  [id number]   GRP:  [id number]   ID:  [id number]     Shelly Bombard, MD 03/06/2020 4:14 PM

## 2020-03-07 LAB — CERVICOVAGINAL ANCILLARY ONLY
Bacterial Vaginitis (gardnerella): NEGATIVE
Candida Glabrata: NEGATIVE
Candida Vaginitis: NEGATIVE
Chlamydia: NEGATIVE
Comment: NEGATIVE
Comment: NEGATIVE
Comment: NEGATIVE
Comment: NEGATIVE
Comment: NEGATIVE
Comment: NORMAL
Neisseria Gonorrhea: NEGATIVE
Trichomonas: NEGATIVE

## 2020-03-08 LAB — CYTOLOGY - PAP
Comment: NEGATIVE
Diagnosis: NEGATIVE
High risk HPV: NEGATIVE

## 2020-03-12 ENCOUNTER — Ambulatory Visit: Payer: BC Managed Care – PPO | Admitting: Podiatry

## 2020-03-12 ENCOUNTER — Encounter: Payer: Self-pay | Admitting: Podiatry

## 2020-03-12 ENCOUNTER — Other Ambulatory Visit: Payer: Self-pay

## 2020-03-12 VITALS — Temp 97.8°F

## 2020-03-12 DIAGNOSIS — E11621 Type 2 diabetes mellitus with foot ulcer: Secondary | ICD-10-CM | POA: Diagnosis not present

## 2020-03-12 DIAGNOSIS — L97411 Non-pressure chronic ulcer of right heel and midfoot limited to breakdown of skin: Secondary | ICD-10-CM | POA: Diagnosis not present

## 2020-03-12 DIAGNOSIS — E1142 Type 2 diabetes mellitus with diabetic polyneuropathy: Secondary | ICD-10-CM

## 2020-03-12 NOTE — Patient Instructions (Signed)
Diabetes Mellitus and Foot Care Foot care is an important part of your health, especially when you have diabetes. Diabetes may cause you to have problems because of poor blood flow (circulation) to your feet and legs, which can cause your skin to:  Become thinner and drier.  Break more easily.  Heal more slowly.  Peel and crack. You may also have nerve damage (neuropathy) in your legs and feet, causing decreased feeling in them. This means that you may not notice minor injuries to your feet that could lead to more serious problems. Noticing and addressing any potential problems early is the best way to prevent future foot problems. How to care for your feet Foot hygiene  Wash your feet daily with warm water and mild soap. Do not use hot water. Then, pat your feet and the areas between your toes until they are completely dry. Do not soak your feet as this can dry your skin.  Trim your toenails straight across. Do not dig under them or around the cuticle. File the edges of your nails with an emery board or nail file.  Apply a moisturizing lotion or petroleum jelly to the skin on your feet and to dry, brittle toenails. Use lotion that does not contain alcohol and is unscented. Do not apply lotion between your toes. Shoes and socks  Wear clean socks or stockings every day. Make sure they are not too tight. Do not wear knee-high stockings since they may decrease blood flow to your legs.  Wear shoes that fit properly and have enough cushioning. Always look in your shoes before you put them on to be sure there are no objects inside.  To break in new shoes, wear them for just a few hours a day. This prevents injuries on your feet. Wounds, scrapes, corns, and calluses  Check your feet daily for blisters, cuts, bruises, sores, and redness. If you cannot see the bottom of your feet, use a mirror or ask someone for help.  Do not cut corns or calluses or try to remove them with medicine.  If you  find a minor scrape, cut, or break in the skin on your feet, keep it and the skin around it clean and dry. You may clean these areas with mild soap and water. Do not clean the area with peroxide, alcohol, or iodine.  If you have a wound, scrape, corn, or callus on your foot, look at it several times a day to make sure it is healing and not infected. Check for: ? Redness, swelling, or pain. ? Fluid or blood. ? Warmth. ? Pus or a bad smell. General instructions  Do not cross your legs. This may decrease blood flow to your feet.  Do not use heating pads or hot water bottles on your feet. They may burn your skin. If you have lost feeling in your feet or legs, you may not know this is happening until it is too late.  Protect your feet from hot and cold by wearing shoes, such as at the beach or on hot pavement.  Schedule a complete foot exam at least once a year (annually) or more often if you have foot problems. If you have foot problems, report any cuts, sores, or bruises to your health care provider immediately. Contact a health care provider if:  You have a medical condition that increases your risk of infection and you have any cuts, sores, or bruises on your feet.  You have an injury that is not   healing.  You have redness on your legs or feet.  You feel burning or tingling in your legs or feet.  You have pain or cramps in your legs and feet.  Your legs or feet are numb.  Your feet always feel cold.  You have pain around a toenail. Get help right away if:  You have a wound, scrape, corn, or callus on your foot and: ? You have pain, swelling, or redness that gets worse. ? You have fluid or blood coming from the wound, scrape, corn, or callus. ? Your wound, scrape, corn, or callus feels warm to the touch. ? You have pus or a bad smell coming from the wound, scrape, corn, or callus. ? You have a fever. ? You have a red line going up your leg. Summary  Check your feet every day  for cuts, sores, red spots, swelling, and blisters.  Moisturize feet and legs daily.  Wear shoes that fit properly and have enough cushioning.  If you have foot problems, report any cuts, sores, or bruises to your health care provider immediately.  Schedule a complete foot exam at least once a year (annually) or more often if you have foot problems. This information is not intended to replace advice given to you by your health care provider. Make sure you discuss any questions you have with your health care provider. Document Revised: 08/23/2019 Document Reviewed: 01/01/2017 Elsevier Patient Education  2020 [employer]  

## 2020-03-13 NOTE — Progress Notes (Signed)
  Subjective:  Patient ID: Erica [name], female    DOB: Apr 14, 1971,  MRN: [id number]  Chief Complaint  Patient presents with  . Diabetic Ulcer    2 week follow up , right heel    follow up ulceration sub heel pad right foot. She states her foot feels much better today and she hopes it's healed  49 y.o. female presents for follow up ulceration sub heel pad right foot. She states her foot feels much better today and she hopes it's healed. She relates no new problems on today's visit.   Objective:  Physical Exam: Vitals:   03/12/20 1321  Temp: 97.8 F (36.6 C)     49 y.o. AA female morbidly obese  in NAD. AAO x 3.  Capillary fill time to digits <3 seconds b/l. Palpable DP pulses b/l. Faintly palpable PT pulses b/l. Pedal hair present b/l. Skin temperature gradient within normal limits b/l.  Pedal skin with normal turgor, texture and tone bilaterally. No open wounds bilaterally. No interdigital macerations bilaterally. Ulceration located sub heel right foot. Predebridement measurements carried out today of 0.2 cm in diameter.  No periulcerative erythema, no edema, no drainage.  No flocculence, no malodor. Roof of ulcer is hyperkeratotic. Postdebridement, ulcer is completely epithelialized.   Normal muscle strength 5/5 to all lower extremity muscle groups bilaterally, no pain crepitus or joint limitation noted with ROM b/l, bunion deformity noted b/l and hammertoes noted to the  R 2nd toe  Protective sensation intact 5/5 intact bilaterally with 10g monofilament b/l Vibratory sensation intact b/l.  Assessment:   1. Diabetic ulcer of right heel associated with type 2 diabetes mellitus, limited to breakdown of skin (HCC)   2. Diabetic peripheral neuropathy associated with type 2 diabetes mellitus (HCC)    Plan:  -Patient was evaluated and treated and all questions answered. -Patient to continue soft, supportive shoe gear daily. -Patient to report any pedal injuries to medical  professional immediately. -Ulcer was debrided. Antibiotic ointment and band-aid applied. She may shower. Will check benefits for orthotics. She will need heels offloaded. -Patient/POA to call should there be question/concern in the interim.  Return in about 4 weeks (around 04/09/2020) for diabetic nail trim (9week nail trim).

## 2020-03-21 ENCOUNTER — Telehealth: Payer: Self-pay | Admitting: Podiatry

## 2020-03-21 NOTE — Telephone Encounter (Signed)
I'm a Dr. Eloy End pt and I have questions about my bill. If you could please call me tomorrow at work. That number is [phone] and I answer the phone. Thank you.

## 2020-04-02 ENCOUNTER — Other Ambulatory Visit: Payer: BC Managed Care – PPO | Admitting: Orthotics

## 2020-04-11 ENCOUNTER — Other Ambulatory Visit: Payer: Self-pay

## 2020-04-11 ENCOUNTER — Ambulatory Visit: Payer: BC Managed Care – PPO | Admitting: Orthotics

## 2020-04-11 DIAGNOSIS — L97411 Non-pressure chronic ulcer of right heel and midfoot limited to breakdown of skin: Secondary | ICD-10-CM

## 2020-04-11 DIAGNOSIS — E1142 Type 2 diabetes mellitus with diabetic polyneuropathy: Secondary | ICD-10-CM

## 2020-04-11 DIAGNOSIS — E11621 Type 2 diabetes mellitus with foot ulcer: Secondary | ICD-10-CM

## 2020-04-11 NOTE — Progress Notes (Signed)
ggg

## 2020-05-27 ENCOUNTER — Ambulatory Visit: Payer: BC Managed Care – PPO | Admitting: Orthotics

## 2020-05-27 ENCOUNTER — Other Ambulatory Visit: Payer: Self-pay

## 2020-05-27 ENCOUNTER — Encounter: Payer: Self-pay | Admitting: Podiatry

## 2020-05-27 ENCOUNTER — Ambulatory Visit: Payer: BC Managed Care – PPO | Admitting: Podiatry

## 2020-05-27 DIAGNOSIS — E11621 Type 2 diabetes mellitus with foot ulcer: Secondary | ICD-10-CM

## 2020-05-27 DIAGNOSIS — L84 Corns and callosities: Secondary | ICD-10-CM | POA: Diagnosis not present

## 2020-05-27 DIAGNOSIS — E1142 Type 2 diabetes mellitus with diabetic polyneuropathy: Secondary | ICD-10-CM

## 2020-05-27 NOTE — Patient Instructions (Signed)
Diabetes Mellitus and Foot Care Foot care is an important part of your health, especially when you have diabetes. Diabetes may cause you to have problems because of poor blood flow (circulation) to your feet and legs, which can cause your skin to:  Become thinner and drier.  Break more easily.  Heal more slowly.  Peel and crack. You may also have nerve damage (neuropathy) in your legs and feet, causing decreased feeling in them. This means that you may not notice minor injuries to your feet that could lead to more serious problems. Noticing and addressing any potential problems early is the best way to prevent future foot problems. How to care for your feet Foot hygiene  Wash your feet daily with warm water and mild soap. Do not use hot water. Then, pat your feet and the areas between your toes until they are completely dry. Do not soak your feet as this can dry your skin.  Trim your toenails straight across. Do not dig under them or around the cuticle. File the edges of your nails with an emery board or nail file.  Apply a moisturizing lotion or petroleum jelly to the skin on your feet and to dry, brittle toenails. Use lotion that does not contain alcohol and is unscented. Do not apply lotion between your toes. Shoes and socks  Wear clean socks or stockings every day. Make sure they are not too tight. Do not wear knee-high stockings since they may decrease blood flow to your legs.  Wear shoes that fit properly and have enough cushioning. Always look in your shoes before you put them on to be sure there are no objects inside.  To break in new shoes, wear them for just a few hours a day. This prevents injuries on your feet. Wounds, scrapes, corns, and calluses  Check your feet daily for blisters, cuts, bruises, sores, and redness. If you cannot see the bottom of your feet, use a mirror or ask someone for help.  Do not cut corns or calluses or try to remove them with medicine.  If you  find a minor scrape, cut, or break in the skin on your feet, keep it and the skin around it clean and dry. You may clean these areas with mild soap and water. Do not clean the area with peroxide, alcohol, or iodine.  If you have a wound, scrape, corn, or callus on your foot, look at it several times a day to make sure it is healing and not infected. Check for: ? Redness, swelling, or pain. ? Fluid or blood. ? Warmth. ? Pus or a bad smell. General instructions  Do not cross your legs. This may decrease blood flow to your feet.  Do not use heating pads or hot water bottles on your feet. They may burn your skin. If you have lost feeling in your feet or legs, you may not know this is happening until it is too late.  Protect your feet from hot and cold by wearing shoes, such as at the beach or on hot pavement.  Schedule a complete foot exam at least once a year (annually) or more often if you have foot problems. If you have foot problems, report any cuts, sores, or bruises to your health care provider immediately. Contact a health care provider if:  You have a medical condition that increases your risk of infection and you have any cuts, sores, or bruises on your feet.  You have an injury that is not   healing.  You have redness on your legs or feet.  You feel burning or tingling in your legs or feet.  You have pain or cramps in your legs and feet.  Your legs or feet are numb.  Your feet always feel cold.  You have pain around a toenail. Get help right away if:  You have a wound, scrape, corn, or callus on your foot and: ? You have pain, swelling, or redness that gets worse. ? You have fluid or blood coming from the wound, scrape, corn, or callus. ? Your wound, scrape, corn, or callus feels warm to the touch. ? You have pus or a bad smell coming from the wound, scrape, corn, or callus. ? You have a fever. ? You have a red line going up your leg. Summary  Check your feet every day  for cuts, sores, red spots, swelling, and blisters.  Moisturize feet and legs daily.  Wear shoes that fit properly and have enough cushioning.  If you have foot problems, report any cuts, sores, or bruises to your health care provider immediately.  Schedule a complete foot exam at least once a year (annually) or more often if you have foot problems. This information is not intended to replace advice given to you by your health care provider. Make sure you discuss any questions you have with your health care provider. Document Revised: 08/23/2019 Document Reviewed: 01/01/2017 Elsevier Patient Education  2020 [employer]  

## 2020-05-27 NOTE — Progress Notes (Signed)
Patient came in today to pick up custom made foot orthotics.  The goals were accomplished and the patient reported no dissatisfaction with said orthotics.  Patient was advised of breakin period and how to report any issues. 

## 2020-06-02 NOTE — Progress Notes (Signed)
Subjective: Erica [name] presents today at risk foot care with history of diabetic neuropathy and painful callus(es) b/l heels. Aggravating factors include weightbearing with and without shoe gear. Pain is relieved with periodic professional debridement.   She states she has been taking care of her husband and her Mom. She will be going on vacation soon.   She takes gabapentin for diabetic neuropathy.  Primus Bravo, NP is patient's PCP. Last visit was: 02/27/2020.  Past Medical History:  Diagnosis Date  . Arthritis   . Depression   . Diabetes mellitus   . Fatty liver   . Hernia, abdominal   . Hypercholesteremia   . IBS (irritable bowel syndrome)      Current Outpatient Medications on File Prior to Visit  Medication Sig Dispense Refill  . metFORMIN (GLUCOPHAGE) 1000 MG tablet Take by mouth.    Marland Kitchen amitriptyline (ELAVIL) 50 MG tablet amitriptyline 50 mg tablet  Take 1 tablet(s) every day by oral route as directed for 30 days.    Marland Kitchen aspirin 81 MG tablet Take 81 mg by mouth at bedtime.     Marland Kitchen atorvastatin (LIPITOR) 20 MG tablet Take 20 mg by mouth at bedtime.     Marland Kitchen buPROPion (WELLBUTRIN XL) 150 MG 24 hr tablet bupropion HCl XL 150 mg 24 hr tablet, extended release  Take one tablet (150 mg total) by mouth every morning.    . busPIRone (BUSPAR) 7.5 MG tablet buspirone 7.5 mg tablet    . escitalopram (LEXAPRO) 10 MG tablet Take by mouth.    . Fluocinolone Acetonide Scalp 0.01 % OIL Apply at bedtime x 2 weeks, then decrease to every other night    . gabapentin (NEURONTIN) 300 MG capsule Take 300 mg by mouth.    Marland Kitchen HYDROcodone-acetaminophen (NORCO) 5-325 MG tablet Norco 5 mg-325 mg tablet  Take 1 tablet po QID prn pain    . ketoconazole (NIZORAL) 2 % shampoo APPLY TOPICALLY TWICE A WEEK FOR 30 DAYS    . ketorolac (TORADOL) 60 MG/2ML SOLN injection ketorolac 60 mg/2 mL intramuscular solution  Inject 60mg  IM x 1    . liraglutide (VICTOZA) 18 MG/3ML SOPN Inject into the skin.     lisinopril (PRINIVIL,ZESTRIL) 5 MG tablet Take by mouth.    . LO LOESTRIN FE 1 MG-10 MCG / 10 MCG tablet Take 1 tablet by mouth daily. 3 Package 4  . meloxicam (MOBIC) 15 MG tablet Take 1 tablet (15 mg total) by mouth daily. 30 tablet 0  . methocarbamol (ROBAXIN) 500 MG tablet methocarbamol 500 mg tablet    . mupirocin ointment (BACTROBAN) 2 % Apply to right heel wound once daily. 30 g 1  . ondansetron (ZOFRAN) 8 MG tablet SMARTSIG:1 Tablet(s) By Mouth Every 12 Hours PRN    . oxyCODONE (OXY IR/ROXICODONE) 5 MG immediate release tablet oxycodone 5 mg tablet  Take 1 tablet(s) every 4 hours by oral route as needed for 10 days.    . pregabalin (LYRICA) 75 MG capsule Take 75 mg by mouth 2 (two) times daily.    . sitaGLIPtin-metformin (JANUMET) 50-1000 MG tablet Take 1 tablet by mouth 2 (two) times daily with a meal.    . traZODone (DESYREL) 100 MG tablet Take by mouth.    . Vitamin D, Ergocalciferol, (DRISDOL) 50000 UNITS CAPS capsule Take 50,000 Units by mouth every 7 (seven) days.     No current facility-administered medications on file prior to visit.     Allergies  Allergen Reactions  . Penicillins  Reported from childhood    Objective: Erica [name] is a pleasant 49 y.o. female obese in NAD. AAO x 3.  There were no vitals filed for this visit.  Vascular Examination: Capillary fill time to digits <3 seconds b/l lower extremities. Palpable DP pulse(s) b/l lower extremities Faintly palpable PT pulse(s) b/l lower extremities. Pedal hair present. Lower extremity skin temperature gradient within normal limits.  Dermatological Examination: Pedal skin with normal turgor, texture and tone bilaterally. No open wounds bilaterally. No interdigital macerations bilaterally. Hyperkeratotic lesion(s) plantar aspect b/l heel pads.  No erythema, no edema, no drainage, no flocculence.  Musculoskeletal: Normal muscle strength 5/5 to all lower extremity muscle groups bilaterally. No pain  crepitus or joint limitation noted with ROM b/l. Hallux valgus with bunion deformity noted b/l lower extremities. Hammertoes noted to the R 2nd toe.  Neurological Examination: Protective sensation intact 5/5 intact bilaterally with 10g monofilament b/l. Vibratory sensation intact b/l.   Assessment: 1. Callus   2. Diabetic peripheral neuropathy associated with type 2 diabetes mellitus ([place])    Plan: -Examined patient. -Continue diabetic foot care principles. -Callus(es) plantar aspect b/l heel pads pared utilizing sterile scalpel blade without complication or incident. Total number debrided =2. -Patient to report any pedal injuries to medical professional immediately. -Patient to continue soft, supportive shoe gear daily. -Patient/POA to call should there be question/concern in the interim.  Return in about 9 weeks (around 07/29/2020) for diabetic nail trim.  Marzetta Board, DPM

## 2020-08-05 ENCOUNTER — Ambulatory Visit: Payer: BC Managed Care – PPO | Admitting: Podiatry

## 2020-08-15 ENCOUNTER — Ambulatory Visit: Payer: BC Managed Care – PPO | Admitting: Obstetrics

## 2020-08-22 ENCOUNTER — Ambulatory Visit: Payer: BC Managed Care – PPO | Admitting: Obstetrics

## 2020-08-22 ENCOUNTER — Encounter: Payer: Self-pay | Admitting: Obstetrics

## 2020-08-22 ENCOUNTER — Other Ambulatory Visit: Payer: Self-pay

## 2020-08-22 VITALS — BP 158/93 | HR 82 | Ht 72.0 in | Wt 291.9 lb

## 2020-08-22 DIAGNOSIS — Z01812 Encounter for preprocedural laboratory examination: Secondary | ICD-10-CM | POA: Diagnosis not present

## 2020-08-22 DIAGNOSIS — N926 Irregular menstruation, unspecified: Secondary | ICD-10-CM

## 2020-08-22 DIAGNOSIS — Z30430 Encounter for insertion of intrauterine contraceptive device: Secondary | ICD-10-CM | POA: Diagnosis not present

## 2020-08-22 LAB — POCT URINE PREGNANCY: Preg Test, Ur: NEGATIVE

## 2020-08-22 MED ORDER — LEVONORGESTREL 20 MCG/24HR IU IUD: INTRAUTERINE_SYSTEM | Freq: Once | INTRAUTERINE | Status: AC

## 2020-08-22 NOTE — Progress Notes (Signed)
Pt is in the office for IUD insertion. 

## 2020-08-22 NOTE — Progress Notes (Signed)
IUD Insertion Procedure Note  Pre-operative Diagnosis: Desires Long-Acting Reversible Contraception ( LARC )  Post-operative Diagnosis: same  Indications: contraception  Procedure Details  Urine pregnancy test was done in office and result was negative.  The risks (including infection, bleeding, pain, and uterine perforation) and benefits of the procedure were explained to the patient and Written informed consent was obtained.    Cervix cleansed with Betadine. Uterus sounded to 6 cm. IUD inserted without difficulty. String visible and trimmed. Patient tolerated procedure well.  IUD Information: Mirena, Lot #:  [id number], Expiration date:  DEC  2023.  Condition: Stable  Complications: None  Plan:  The patient was advised to call for any fever or for prolonged or severe pain or bleeding. She was advised to use NSAID as needed for mild to moderate pain.   Attending Physician Documentation: I was present for or participated in the entire procedure, including opening and closing.   Brock Bad, MD 08/22/2020 4:06 PM

## 2020-08-28 ENCOUNTER — Ambulatory Visit: Payer: BC Managed Care – PPO | Admitting: Obstetrics

## 2020-08-30 ENCOUNTER — Ambulatory Visit
Admission: RE | Admit: 2020-08-30 | Discharge: 2020-08-30 | Disposition: A | Discharge status: Home / Self Care | Payer: BC Managed Care – PPO | Source: Ambulatory Visit | Attending: Obstetrics | Admitting: Obstetrics

## 2020-08-30 DIAGNOSIS — Z30430 Encounter for insertion of intrauterine contraceptive device: Secondary | ICD-10-CM

## 2020-08-30 DIAGNOSIS — N926 Irregular menstruation, unspecified: Secondary | ICD-10-CM

## 2020-09-04 ENCOUNTER — Telehealth: Payer: Self-pay

## 2020-09-04 NOTE — Telephone Encounter (Signed)
Pt called and reports that she started bleeding yesterday like a period. Pt had IUD inserted about two weeks ago and [place] following. Pt is scheduled to be seen next Monday in office to review [place] and check IUD. I advised pt that if she begins to bleed heavily filling up more than one pad every 2 hours or is in pain that she should go be evaluated sooner, pt voices understanding.

## 2020-09-09 ENCOUNTER — Ambulatory Visit (INDEPENDENT_AMBULATORY_CARE_PROVIDER_SITE_OTHER): Payer: BC Managed Care – PPO | Admitting: Obstetrics

## 2020-09-09 ENCOUNTER — Encounter: Payer: Self-pay | Admitting: Obstetrics

## 2020-09-09 ENCOUNTER — Other Ambulatory Visit: Payer: Self-pay

## 2020-09-09 VITALS — BP 149/90 | HR 97

## 2020-09-09 DIAGNOSIS — Z30431 Encounter for routine checking of intrauterine contraceptive device: Secondary | ICD-10-CM | POA: Diagnosis not present

## 2020-09-09 DIAGNOSIS — R9389 Abnormal findings on diagnostic imaging of other specified body structures: Secondary | ICD-10-CM

## 2020-09-09 DIAGNOSIS — Z30016 Encounter for initial prescription of transdermal patch hormonal contraceptive device: Secondary | ICD-10-CM

## 2020-09-09 DIAGNOSIS — T8332XS Displacement of intrauterine contraceptive device, sequela: Secondary | ICD-10-CM | POA: Diagnosis not present

## 2020-09-09 DIAGNOSIS — Z3009 Encounter for other general counseling and advice on contraception: Secondary | ICD-10-CM

## 2020-09-09 MED ORDER — XULANE 150-35 MCG/24HR TD PTWK: 1.0000 | MEDICATED_PATCH | TRANSDERMAL | 12 refills | Status: DC

## 2020-09-09 NOTE — Progress Notes (Signed)
Pt is in office for f/u ultrasound results.

## 2020-09-09 NOTE — Progress Notes (Signed)
Subjective:    Erica [name] is a 49 y.o. female who presents for contraception counseling. The patient has no complaints today. The patient is sexually active. Pertinent past medical history: none.  The information documented in the HPI was reviewed and verified.  Menstrual History: OB History    Gravida  1   Para      Term      Preterm      AB      Living  1     SAB      TAB      Ectopic      Multiple      Live Births  1            Patient's last menstrual period was 08/21/2020.   Patient Active Problem List   Diagnosis Date Noted  . Puncture wound of right foot 02/08/2020  . Sinusitis 10/31/2019  . Urine finding 10/31/2019  . Microalbuminuria 05/27/2018  . Hyperlipidemia 05/24/2017  . Inflammatory disorder of digestive tract 05/24/2017  . Low back pain 05/24/2017  . Type 2 diabetes mellitus with neurologic complication, without long-term current use of insulin (HCC) 05/24/2017  . Vitamin D deficiency 05/24/2017  . Otalgia of both ears 06/09/2016  . Temporomandibular joint (TMJ) pain 06/09/2016  . 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 Obesity 03/21/2015   Past Medical History:  Diagnosis Date  . Arthritis   . Depression   . Diabetes mellitus   . Fatty liver   . Hernia, abdominal   . Hypercholesteremia   . IBS (irritable bowel syndrome)     Past Surgical History:  Procedure Laterality Date  . CESAREAN SECTION    . CHOLECYSTECTOMY       Current Outpatient Medications:  .  amitriptyline (ELAVIL) 50 MG tablet, amitriptyline 50 mg tablet  Take 1 tablet(s) every day by oral route as directed for 30 days., Disp: , Rfl:  .  aspirin 81 MG tablet, Take 81 mg by mouth at bedtime. , Disp: , Rfl:  .  atorvastatin (LIPITOR) 20 MG tablet, Take 20 mg by mouth at bedtime. , Disp: , Rfl:  .  buPROPion (WELLBUTRIN XL) 150 MG 24 hr tablet, bupropion HCl XL 150 mg 24 hr tablet, extended release  Take one tablet (150 mg total) by mouth every morning.,  Disp: , Rfl:  .  busPIRone (BUSPAR) 7.5 MG tablet, buspirone 7.5 mg tablet, Disp: , Rfl:  .  escitalopram (LEXAPRO) 10 MG tablet, Take by mouth., Disp: , Rfl:  .  Fluocinolone Acetonide Scalp 0.01 % OIL, Apply at bedtime x 2 weeks, then decrease to every other night, Disp: , Rfl:  .  gabapentin (NEURONTIN) 300 MG capsule, Take 300 mg by mouth., Disp: , Rfl:  .  HYDROcodone-acetaminophen (NORCO) 5-325 MG tablet, Norco 5 mg-325 mg tablet  Take 1 tablet po QID prn pain, Disp: , Rfl:  .  ketoconazole (NIZORAL) 2 % shampoo, APPLY TOPICALLY TWICE A WEEK FOR 30 DAYS, Disp: , Rfl:  .  ketorolac (TORADOL) 60 MG/2ML SOLN injection, ketorolac 60 mg/2 mL intramuscular solution  Inject 60mg  IM x 1, Disp: , Rfl:  .  liraglutide (VICTOZA) 18 MG/3ML SOPN, Inject into the skin., Disp: , Rfl:  .  lisinopril (PRINIVIL,ZESTRIL) 5 MG tablet, Take by mouth., Disp: , Rfl:  .  meloxicam (MOBIC) 15 MG tablet, Take 1 tablet (15 mg total) by mouth daily., Disp: 30 tablet, Rfl: 0 .  metFORMIN (GLUCOPHAGE) 1000 MG tablet, Take by mouth., Disp: ,  Rfl:  .  methocarbamol (ROBAXIN) 500 MG tablet, methocarbamol 500 mg tablet, Disp: , Rfl:  .  mupirocin ointment (BACTROBAN) 2 %, Apply to right heel wound once daily., Disp: 30 g, Rfl: 1 .  norelgestromin-ethinyl estradiol (XULANE) 150-35 MCG/24HR transdermal patch, Place 1 patch onto the skin once a week., Disp: 3 patch, Rfl: 12 .  ondansetron (ZOFRAN) 8 MG tablet, SMARTSIG:1 Tablet(s) By Mouth Every 12 Hours PRN, Disp: , Rfl:  .  oxyCODONE (OXY IR/ROXICODONE) 5 MG immediate release tablet, oxycodone 5 mg tablet  Take 1 tablet(s) every 4 hours by oral route as needed for 10 days., Disp: , Rfl:  .  pregabalin (LYRICA) 75 MG capsule, Take 75 mg by mouth 2 (two) times daily., Disp: , Rfl:  .  sitaGLIPtin-metformin (JANUMET) 50-1000 MG tablet, Take 1 tablet by mouth 2 (two) times daily with a meal., Disp: , Rfl:  .  traZODone (DESYREL) 100 MG tablet, Take by mouth., Disp: , Rfl:  .   Vitamin D, Ergocalciferol, (DRISDOL) 50000 UNITS CAPS capsule, Take 50,000 Units by mouth every 7 (seven) days., Disp: , Rfl:  Allergies  Allergen Reactions  . Penicillins     Reported from childhood    Social History   Tobacco Use  . Smoking status: Never Smoker  . Smokeless tobacco: Never Used  Substance Use Topics  . Alcohol use: No    Family History  Problem Relation Age of Onset  . Hypertension Mother   . Heart failure Mother   . Fibroids Mother   . Fibroids Sister   . Fibroids Maternal Grandmother   . Diabetes type II Paternal Grandmother        Review of Systems Constitutional: negative for weight loss Genitourinary:negative for abnormal menstrual periods and vaginal discharge   Objective:   BP (!) 149/90   Pulse 97   LMP 08/21/2020   PE:  Deferred  Lab Review Urine pregnancy test Labs reviewed yes Radiologic studies reviewed yes: US PELVIC COMPLETE WITH TRANSVAGINAL (Accession [id number]) (Order [id number]) Imaging Date: 08/30/2020 Department: [facility] Released By: Meriel Pica Authorizing: Brock Bad, MD  Exam Status  Status  Final [99]  PACS Intelerad Image Link  Show images for US PELVIC COMPLETE WITH TRANSVAGINAL Study Result  Narrative & Impression  CLINICAL DATA:  Irregular menstrual cycles.  Assess IUD placement.  EXAM: TRANSABDOMINAL AND TRANSVAGINAL ULTRASOUND OF PELVIS  TECHNIQUE: Both transabdominal and transvaginal ultrasound examinations of the pelvis were performed. Transabdominal technique was performed for global imaging of the pelvis including uterus, ovaries, adnexal regions, and pelvic cul-de-sac. It was necessary to proceed with endovaginal exam following the transabdominal exam to visualize the endometrium and ovaries.  COMPARISON:  12/30/2006  FINDINGS: Uterus  Measurements: 7.1 x 7.2 x 11.4 cm = volume: 305 mL. No fibroids or other mass  visualized.  Endometrium  Thickness: 20 mm. No significant heterogeneity or focal mass visualized. IUD low in position located over the endometrial canal of the lower uterine segment.  Right ovary  Not visualized.  Left ovary  Not visualized.  Other findings  No abnormal free fluid.  IMPRESSION: 1. Thickened endometrium measuring 20 mm. No focal endometrial mass visualized. Findings may be seen with hyperplasia, submucosal fibroid or malignancy. Recommend gyn consultation. 2. IUD low in position located over the endometrial canal of the lower uterine segment. 3. Nonvisualization of the ovaries.   Electronically Signed   By: Elberta Fortis M.D.   On: 09/01/2020 11:26    >  50% of 15 min visit spent on counseling and coordination of care.    Assessment:    49 y.o., discontinuing IUD, and starting the [facility] Patch, no contraindications.   Plan:   1. IUD check up  2. Thickened endometrium  3. Malpositioned intrauterine device (IUD), sequela - will schedule removal  4. Encounter for other general counseling and advice on contraception - wants the Patch  5. Encounter for initial prescription of transdermal patch hormonal contraceptive device - - norelgestromin-ethinyl estradiol [employer]) 150-35 MCG/24HR transdermal patch; Place 1 patch onto the skin once a week.  Dispense: 3 patch; Refill: 12    All questions answered. Contraception: [employer] weekly. Diagnosis explained in detail, including differential. Discussed healthy lifestyle modifications. Follow up in a few weeks.  IUD Removal.  Meds ordered this encounter  Medications  . norelgestromin-ethinyl estradiol [employer]) 150-35 MCG/24HR transdermal patch    Sig: Place 1 patch onto the skin once a week.    Dispense:  3 patch    Refill:  12    Brock Bad, MD 09/09/2020 9:23 AM

## 2020-10-01 ENCOUNTER — Other Ambulatory Visit: Payer: Self-pay

## 2020-10-01 ENCOUNTER — Encounter: Payer: Self-pay | Admitting: Obstetrics

## 2020-10-01 ENCOUNTER — Ambulatory Visit (INDEPENDENT_AMBULATORY_CARE_PROVIDER_SITE_OTHER): Payer: BC Managed Care – PPO | Admitting: Obstetrics

## 2020-10-01 VITALS — BP 133/83 | HR 105 | Wt 291.0 lb

## 2020-10-01 DIAGNOSIS — R9389 Abnormal findings on diagnostic imaging of other specified body structures: Secondary | ICD-10-CM | POA: Diagnosis not present

## 2020-10-01 DIAGNOSIS — Z30432 Encounter for removal of intrauterine contraceptive device: Secondary | ICD-10-CM | POA: Diagnosis not present

## 2020-10-01 DIAGNOSIS — T8332XS Displacement of intrauterine contraceptive device, sequela: Secondary | ICD-10-CM | POA: Diagnosis not present

## 2020-10-01 MED ORDER — MEGESTROL ACETATE 40 MG PO TABS
40.0000 mg | ORAL_TABLET | Freq: Every day | ORAL | 0 refills | Status: DC
Start: 2020-10-01 — End: 2021-02-06

## 2020-10-01 NOTE — Patient Instructions (Signed)
Abnormal Uterine Bleeding Abnormal uterine bleeding is unusual bleeding from the uterus. It includes:  Bleeding or spotting between periods.  Bleeding after sex.  Bleeding that is heavier than normal.  Periods that last longer than usual.  Bleeding after menopause. Abnormal uterine bleeding can affect women at various stages in life, including teenagers, women in their reproductive years, pregnant women, and women who have reached menopause. Common causes of abnormal uterine bleeding include:  Pregnancy.  Growths of tissue (polyps).  A noncancerous tumor in the uterus (fibroid).  Infection.  Cancer.  Hormonal imbalances. Any type of abnormal bleeding should be evaluated by a health care provider. Many cases are minor and simple to treat, while others are more serious. Treatment will depend on the cause of the bleeding. Follow these instructions at home:  Monitor your condition for any changes.  Do not use tampons, douche, or have sex if told by your health care provider.  Change your pads often.  Get regular exams that include pelvic exams and cervical cancer screening.  Keep all follow-up visits as told by your health care provider. This is important. Contact a health care provider if:  Your bleeding lasts for more than one week.  You feel dizzy at times.  You feel nauseous or you vomit. Get help right away if:  You pass out.  Your bleeding soaks through a pad every hour.  You have abdominal pain.  You have a fever.  You become sweaty or weak.  You pass large blood clots from your vagina. Summary  Abnormal uterine bleeding is unusual bleeding from the uterus.  Any type of abnormal bleeding should be evaluated by a health care provider. Many cases are minor and simple to treat, while others are more serious.  Treatment will depend on the cause of the bleeding. This information is not intended to replace advice given to you by your health care provider.  Make sure you discuss any questions you have with your health care provider. Document Revised: 03/09/2018 Document Reviewed: 01/01/2017 Elsevier Patient Education  2020 Else[employer] Inc[employer]  Endometrial Biopsy  Endometrial biopsy is a procedure in which a tissue sample is taken from inside the uterus. The sample is taken from the endometrium, which is the lining of the uterus. The tissue sample is then checked under a microscope to see if the tissue is normal or abnormal. This procedure helps to determine where you are in your menstrual cycle and how hormone levels are affecting the lining of the uterus. This procedure may also be used to evaluate uterine bleeding or to diagnose endometrial cancer, endometrial tuberculosis, polyps, or other inflammatory conditions. Tell a health care provider about:  Any allergies you have.  All medicines you are taking, including vitamins, herbs, eye drops, creams, and over-the-counter medicines.  Any problems you or family members have had with anesthetic medicines.  Any blood disorders you have.  Any surgeries you have had.  Any medical conditions you have.  Whether you are pregnant or may be pregnant. What are the risks? Generally, this is a safe procedure. However, problems may occur, including:  Bleeding.  Pelvic infection.  Puncture of the wall of the uterus with the biopsy device (rare). What happens before the procedure?  Keep a record of your menstrual cycles as told by your health care provider. You may need to schedule your procedure for a specific time in your cycle.  You may want to bring a sanitary pad to wear after the procedure.  Ask your   health care provider about: ? Changing or stopping your regular medicines. This is especially important if you are taking diabetes medicines or blood thinners. ? Taking medicines such as aspirin and ibuprofen. These medicines can thin your blood. Do not take these medicines before your procedure if  your health care provider instructs you not to.  Plan to have someone take you home from the hospital or clinic. What happens during the procedure?  To lower your risk of infection: ? Your health care team will wash or sanitize their hands.  You will lie on an exam table with your feet and legs supported as in a pelvic exam.  Your health care provider will insert an instrument (speculum) into your vagina to see your cervix.  Your cervix will be cleansed with an antiseptic solution.  A medicine (local anesthetic) will be used to numb the cervix.  A forceps instrument (tenaculum) will be used to hold your cervix steady for the biopsy.  A thin, rod-like instrument (uterine sound) will be inserted through your cervix to determine the length of your uterus and the location where the biopsy sample will be removed.  A thin, flexible tube (catheter) will be inserted through your cervix and into the uterus. The catheter will be used to collect the biopsy sample from your endometrial tissue.  The catheter and speculum will then be removed, and the tissue sample will be sent to a lab for examination. What happens after the procedure?  You will rest in a recovery area until you are ready to go home.  You may have mild cramping and a small amount of vaginal bleeding. This is normal.  It is up to you to get the results of your procedure. Ask your health care provider, or the department that is doing the procedure, when your results will be ready. Summary  Endometrial biopsy is a procedure in which a tissue sample is taken from the endometrium, which is the lining of the uterus.  This procedure may help to diagnose menstrual cycle problems, abnormal bleeding, or other conditions affecting the endometrium.  Before the procedure, keep a record of your menstrual cycles as told by your health care provider.  The tissue sample that is removed will be checked under a microscope to see if it is  normal or abnormal. This information is not intended to replace advice given to you by your health care provider. Make sure you discuss any questions you have with your health care provider. Document Revised: 11/12/2017 Document Reviewed: 12/16/2016 Elsevier Patient Education  2020 Elsevier Inc.  Endometrial Hyperplasia  Endometrial hyperplasia is abnormal thickening of the tissue that lines the inside of the uterus (endometrium). This condition can also cause cell changes (atypia) that can lead to endometrial cancer. There are four types of endometrial hyperplasia:  Endometrial thickening only (simple hyperplasia).  Endometrial thickening and crowding of cells (complex hyperplasia).  Endometrial thickening and cell changes (simple atypical hyperplasia). This type has a low risk of becoming cancerous.  Endometrial thickening, cell crowding, and cell changes (complex atypical hyperplasia). This type has a higher risk of becoming cancerous. Early diagnosis and treatment are important to reduce the risk of cancer. What are the causes? This condition is caused by an imbalance of the reproductive hormones estrogen and progesterone. At the beginning of your menstrual cycle, your ovaries make estrogen. This thickens the lining of your uterus to prepare for pregnancy. If pregnancy does not occur, your estrogen level drops. Then, the hormone progesterone prompts your  body to shed the lining of your uterus. This is your menstrual period. Endometrial hyperplasia results from too much estrogen and not enough progesterone. What increases the risk? The following factors may make you more likely to develop this condition:  Being older than 47 years of age. The condition is most common just before or after menopause. Menopause means 12 months without a menstrual period.  Taking an estrogen medicine or a medicine that acts like estrogen.  Having polycystic ovary syndrome (PCOS).  Being  overweight.  Smoking.  Having diabetes.  Having irregular periods.  Having started periods early.  Having started menopause late.  Never having been pregnant.  Having a family history of uterine, ovarian, or colon cancer.  Having other diseases such as gallbladder or thyroid disease. What are the signs or symptoms? The main symptom of this condition is abnormal vaginal bleeding. The bleeding may:  Be heavier and longer during menstrual periods.  Happen more often than a normal menstrual cycle.  Occur after menopause. How is this diagnosed? This condition may be diagnosed based on:  Your symptoms and medical history, including risk factors.  A physical exam.  Tests, such as: ? An imaging study done with sound waves (transvaginal ultrasound) to check the endometrium. This test uses a sound wave probe that is inserted into your vagina. ? A procedure to take a sample of endometrial tissue through the vagina so it can be looked at under a microscope (endometrial biopsy). ? A procedure in which tissue is scraped or suctioned from the inside of the uterus (dilation and curettage). ? A procedure in which a thin, lighted device is inserted into the uterus through the cervix to see inside the uterus (hysteroscopy). How is this treated? Treatment for this condition depends on the type of hyperplasia that you have. Synthetic progesterone (progestin) is the most common treatment. Progestin can be given as a pill, injection, or vaginal cream, or as a device that is implanted in your uterus (intrauterine device, IUD). If you have hyperplasia with atypia, you may need surgery to remove your uterus (hysterectomy). You may be more likely to have this procedure if:  You have complex atypical hyperplasia.  You are past menopause.  You do not want to become pregnant. Follow these instructions at home:   Take over-the-counter and prescription medicines only as told by your health care  provider.  Do not use any products that contain nicotine or tobacco, such as cigarettes and e-cigarettes. If you need help quitting, ask your health care provider.  Lose weight if you are overweight. Ask your health care provider to recommend a diet and exercise program to help you reach and maintain a healthy weight.  Keep all follow-up visits as told by your health care provider. This is important. Contact a health care provider if:  You have periods that are heavier or last longer than usual.  You have your period more often than usual.  You have vaginal bleeding after menopause. Get help right away if:  You have vaginal bleeding that is so heavy that you need to change your tampon or sanitary napkin after less than 2 hours.  You pass blood clots that are larger than the size of a quarter. Summary  Endometrial hyperplasia is abnormal thickening of the tissue that lines the inside of the uterus (endometrium).  The main symptom of this condition is abnormal vaginal bleeding. This may be heavier, longer, or more frequent periods, or it may be vaginal bleeding after menopause.  Some types of hyperplasia also cause cell changes (atypia) that can lead to cancer.  Always let your health care provider know about abnormal vaginal bleeding.  Early diagnosis and treatment are important to reduce the risk of cancer. This information is not intended to replace advice given to you by your health care provider. Make sure you discuss any questions you have with your health care provider. Document Revised: 09/11/2016 Document Reviewed: 09/11/2016 Elsevier Patient Education  2020 [employer].

## 2020-10-01 NOTE — Progress Notes (Signed)
Pt states IUD is misplaced and needs to be removed.

## 2020-10-01 NOTE — Progress Notes (Signed)
    GYNECOLOGY OFFICE PROCEDURE NOTE  Erica [name] is a 49 y.o. G1P0 here for mIRENA IUD removal. No GYN concerns.  Last pap smear was on 03-06-2020 and was normal.  IUD Removal  Patient identified, informed consent performed, consent signed.  Patient was in the dorsal lithotomy position, normal external genitalia was noted.  A speculum was placed in the patient's vagina, normal discharge was noted, no lesions. The cervix was visualized, no lesions, no abnormal discharge.  The strings of the IUD were grasped and pulled using ring forceps. The IUD was removed in its entirety.  Patient tolerated the procedure well.    A/P: 1. Malpositioned intrauterine device (IUD), sequela  2. Endometrial thickening on ultrasound Rx: - megestrol (MEGACE) 40 MG tablet; Take 1 tablet (40 mg total) by mouth daily.  Dispense: 60 tablet; Refill: 0 - US PELVIC COMPLETE WITH TRANSVAGINAL; Future  3. Encounter for IUD removal   Patient will use nothing for contraception until the endometrial thickening that was seen on ultrasound is resolved.  Megace started.  Will follow up in 2 months with repeat ultrasound and probable endometrial biopsy.  Routine preventative health maintenance measures emphasized.   Brock Bad, MD, FACOG Obstetrician & Gynecologist, [facility] for [facility], [facility]  10/01/20

## 2020-10-10 ENCOUNTER — Other Ambulatory Visit: Payer: Self-pay

## 2020-10-10 MED ORDER — FLUCONAZOLE 150 MG PO TABS: 150.0000 mg | ORAL_TABLET | Freq: Once | ORAL | 0 refills | Status: DC

## 2020-10-10 MED ORDER — FLUCONAZOLE 150 MG PO TABS: 150.0000 mg | ORAL_TABLET | Freq: Once | ORAL | 0 refills | Status: AC

## 2020-10-10 NOTE — Progress Notes (Signed)
Diflucan rx resent to pharmacy with correct sig.

## 2020-10-10 NOTE — Progress Notes (Signed)
Pt called and reports symptoms of vaginal yeast infection (discharge and itching). Rx diflucan sent to pt preferred pharmacy per protocol for yeast infection, pt made aware and she voices understanding.

## 2020-10-12 ENCOUNTER — Ambulatory Visit (HOSPITAL_COMMUNITY)
Admission: EM | Admit: 2020-10-12 | Discharge: 2020-10-12 | Disposition: A | Discharge status: Home / Self Care | Payer: BC Managed Care – PPO | Attending: Family Medicine | Admitting: Family Medicine

## 2020-10-12 ENCOUNTER — Encounter (HOSPITAL_COMMUNITY): Payer: Self-pay

## 2020-10-12 ENCOUNTER — Other Ambulatory Visit: Payer: Self-pay

## 2020-10-12 DIAGNOSIS — S161XXA Strain of muscle, fascia and tendon at neck level, initial encounter: Secondary | ICD-10-CM | POA: Diagnosis not present

## 2020-10-12 DIAGNOSIS — M791 Myalgia, unspecified site: Secondary | ICD-10-CM

## 2020-10-12 MED ORDER — KETOROLAC TROMETHAMINE 60 MG/2ML IM SOLN
INTRAMUSCULAR | Status: AC
  Filled 2020-10-12: qty 2

## 2020-10-12 MED ORDER — CYCLOBENZAPRINE HCL 10 MG PO TABS: 10.0000 mg | ORAL_TABLET | Freq: Two times a day (BID) | ORAL | 0 refills | Status: DC | PRN

## 2020-10-12 MED ORDER — KETOROLAC TROMETHAMINE 60 MG/2ML IM SOLN
60.0000 mg | Freq: Once | INTRAMUSCULAR | Status: AC
  Administered 2020-10-12: 60 mg via INTRAMUSCULAR

## 2020-10-12 MED ORDER — DICLOFENAC SODIUM 50 MG PO TBEC: 50.0000 mg | DELAYED_RELEASE_TABLET | Freq: Two times a day (BID) | ORAL | 0 refills | Status: DC | PRN
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
## 2020-10-12 NOTE — Discharge Instructions (Addendum)
You received a Toradol injection here in clinic today this should help with your neck pain and torso pain.  I prescribed her diclofenac which is also an anti-inflammatory you may take prior to bedtime if needed tonight otherwise just take as needed up to 2 times daily.  I have also prescribed you cyclobenzaprine 10 mg this is for muscle tension and is a muscle relaxer take prior to bedtime or if not operate a motor vehicle as medication can cause severe drowsiness
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
## 2020-10-22 ENCOUNTER — Telehealth: Payer: Self-pay

## 2020-10-22 NOTE — Telephone Encounter (Signed)
Patient called requesting rx for yeast. She states that she has yeast in her groin area and under her arms. Sx started after using a new soap. Rash is itching. She denies having any vaginal discharge, or odor.  Please review for Rx.

## 2020-10-23 ENCOUNTER — Other Ambulatory Visit: Payer: Self-pay | Admitting: Obstetrics

## 2020-10-23 DIAGNOSIS — B369 Superficial mycosis, unspecified: Secondary | ICD-10-CM

## 2020-10-23 DIAGNOSIS — B3731 Acute candidiasis of vulva and vagina: Secondary | ICD-10-CM

## 2020-10-23 DIAGNOSIS — B373 Candidiasis of vulva and vagina: Secondary | ICD-10-CM

## 2020-10-23 MED ORDER — FLUCONAZOLE 200 MG PO TABS: 200.0000 mg | ORAL_TABLET | ORAL | 2 refills | Status: DC

## 2020-10-23 MED ORDER — CLOTRIMAZOLE 1 % EX CREA: 1.0000 "application " | TOPICAL_CREAM | Freq: Two times a day (BID) | CUTANEOUS | 0 refills | Status: DC

## 2020-10-23 NOTE — Telephone Encounter (Signed)
Clotrimazole Rx for external yeast infection Diflucan Rx for yeast vaginitis

## 2020-11-10 ENCOUNTER — Other Ambulatory Visit: Payer: Self-pay

## 2020-11-10 ENCOUNTER — Ambulatory Visit (HOSPITAL_COMMUNITY)
Admission: EM | Admit: 2020-11-10 | Discharge: 2020-11-10 | Disposition: A | Discharge status: Home / Self Care | Payer: BC Managed Care – PPO | Attending: Family Medicine | Admitting: Family Medicine

## 2020-11-10 ENCOUNTER — Encounter (HOSPITAL_COMMUNITY): Payer: Self-pay | Admitting: *Deleted

## 2020-11-10 DIAGNOSIS — M545 Low back pain, unspecified: Secondary | ICD-10-CM | POA: Diagnosis not present

## 2020-11-10 MED ORDER — HYDROCODONE-ACETAMINOPHEN 5-325 MG PO TABS
1.0000 | ORAL_TABLET | Freq: Four times a day (QID) | ORAL | 0 refills | Status: DC | PRN
Start: 2020-11-10 — End: 2023-02-16

## 2020-11-10 MED ORDER — NAPROXEN 500 MG PO TABS: 500.0000 mg | ORAL_TABLET | Freq: Two times a day (BID) | ORAL | 0 refills | Status: DC
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
## 2020-11-10 NOTE — Discharge Instructions (Signed)
Naprosyn twice daily with food Hydrocodone for severe pain Physical therapy referral placed Alternate ice and heat Continue gentle stretching Follow-up if not improving or worsening

## 2020-12-03 ENCOUNTER — Other Ambulatory Visit: Payer: BC Managed Care – PPO

## 2020-12-10 ENCOUNTER — Ambulatory Visit: Payer: BC Managed Care – PPO | Attending: Emergency Medicine

## 2020-12-10 ENCOUNTER — Other Ambulatory Visit: Payer: Self-pay

## 2020-12-10 ENCOUNTER — Ambulatory Visit: Payer: BC Managed Care – PPO | Admitting: Rehabilitative and Restorative Service Providers"

## 2020-12-10 DIAGNOSIS — G8929 Other chronic pain: Secondary | ICD-10-CM | POA: Insufficient documentation

## 2020-12-10 DIAGNOSIS — M6281 Muscle weakness (generalized): Secondary | ICD-10-CM | POA: Insufficient documentation

## 2020-12-10 DIAGNOSIS — M545 Low back pain, unspecified: Secondary | ICD-10-CM | POA: Diagnosis present

## 2020-12-10 DIAGNOSIS — M62830 Muscle spasm of back: Secondary | ICD-10-CM | POA: Insufficient documentation

## 2020-12-10 DIAGNOSIS — R262 Difficulty in walking, not elsewhere classified: Secondary | ICD-10-CM | POA: Insufficient documentation
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
## 2020-12-16 ENCOUNTER — Ambulatory Visit: Payer: BC Managed Care – PPO

## 2020-12-18 ENCOUNTER — Other Ambulatory Visit: Payer: Self-pay

## 2020-12-18 ENCOUNTER — Ambulatory Visit: Payer: BC Managed Care – PPO | Attending: Emergency Medicine

## 2020-12-18 DIAGNOSIS — R262 Difficulty in walking, not elsewhere classified: Secondary | ICD-10-CM

## 2020-12-18 DIAGNOSIS — M62830 Muscle spasm of back: Secondary | ICD-10-CM | POA: Diagnosis present

## 2020-12-18 DIAGNOSIS — G8929 Other chronic pain: Secondary | ICD-10-CM | POA: Insufficient documentation

## 2020-12-18 DIAGNOSIS — M545 Low back pain, unspecified: Secondary | ICD-10-CM | POA: Diagnosis not present

## 2020-12-18 DIAGNOSIS — M6281 Muscle weakness (generalized): Secondary | ICD-10-CM

## 2020-12-18 NOTE — Therapy (Signed)
[facility] Outpatient Rehabilitation [facility] [address] [place], [place], [address] Phone: [phone]   Fax:  [phone]  Physical Therapy Treatment  Patient Details  Name: Erica [name] MRN: [id number] Date of Birth: 1971/01/15 Referring Provider (PT): Lew Dawes, [place]   Encounter Date: 12/18/2020   PT End of Session - 12/18/20 1639    Visit Number 2    Number of Visits 17    Date for PT Re-Evaluation 02/08/21    Authorization Type BCBS - FOTO at visit 6 and visit 10    PT Start Time 1545    PT Stop Time 1628    PT Time Calculation (min) 43 min    Activity Tolerance Patient tolerated treatment well    Behavior During Therapy [facility] for tasks assessed/performed           Past Medical History:  Diagnosis Date  . Arthritis   . Depression   . Diabetes mellitus   . Fatty liver   . Hernia, abdominal   . Hypercholesteremia   . IBS (irritable bowel syndrome)     Past Surgical History:  Procedure Laterality Date  . CESAREAN SECTION    . CHOLECYSTECTOMY      There were no vitals filed for this visit.   Subjective Assessment - 12/18/20 1548    Subjective Patient reports her back is feeling about the same, but the neck is doing ok.    Pertinent History See extensive PMH above    Currently in Pain? Yes    Pain Score 4     Pain Location Back    Pain Orientation Left;Lower    Pain Descriptors / Indicators Dull   occasional sharp pain with sitting   Pain Type Acute pain    Pain Onset More than a month ago                             [facility] Adult PT Treatment/Exercise - 12/18/20 0001      Self-Care   Other Self-Care Comments  see patient education      Lumbar Exercises: Stretches   Lower Trunk Rotation Limitations 2 min    Figure 4 Stretch Limitations 1 min each    Other Lumbar Stretch Exercise glute stretch 1 min each    Other Lumbar Stretch Exercise QL doorway stretch 30 sec each      Manual Therapy   Manual  therapy comments STM/DTM to Lt lumbar paraspinals, QL, posterior gluteal musculature, CPAs grade II-III L1-L5, passive piriformis and quad stretch                  PT Education - 12/18/20 1639    Education Details Education on HEP. Education on lumbar anatomy. Education on DN indications, side effects.    Person(s) Educated Patient    Methods Explanation;Demonstration;Verbal cues    Comprehension Verbalized understanding;Returned demonstration            PT Short Term Goals - 12/10/20 1515      PT SHORT TERM GOAL #1   Title Patient will be independent with initial HEP.    Baseline Intiial HEP will be issued at patient's first follow up visit after evaluation.    Time 4    Period Weeks    Status New    Target Date 01/07/21      PT SHORT TERM GOAL #2   Title Patient will be able to perform lumbar AROM in all planes  with </= 3/10 L LBP.    Baseline 5/10 at rest with slightly increased pain during AROM into FL, EXT, L SB, and L ROT    Time 4    Period Weeks    Status New    Target Date 01/07/21      PT SHORT TERM GOAL #3   Title Patient's FOTO score will improve from 50% function to at least 55% function to demonstrate improvement in perceived ability.    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    Baseline Intiial HEP will be issued at patient's first follow up visit after evaluation.    Time 8    Period Weeks    Status New     Target Date 02/04/21      PT LONG TERM GOAL #2   Title Patient will be able to perform lumbar AROM WFL in all planes with </= 1/10 L LBP.    Baseline 5/10 at rest with slightly increased pain during AROM into FL, EXT, L SB, and L ROT    Time 8    Period Weeks    Status New    Target Date 02/04/21      PT LONG TERM GOAL #3   Title Patient's FOTO score will improve from 50% function to at least 63% function to demonstrate improvement in perceived ability.    Baseline 50% function; predicted 63% function    Time 8    Period Weeks    Status New    Target Date 02/04/21      PT LONG TERM GOAL #4   Title Patient will be able to perform household and work activities without L LBP.    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 Notable hypomobility present about L-spine with patient reporting referral into Lt low back with CPAs at L2-L3. Good tolerance to introduction to lumbar mobility and hip stretching without reports of pain.    Personal Factors and Comorbidities Comorbidity 3+;Past/Current Experience;Time since onset of injury/illness/exacerbation;Profession;Fitness    Comorbidities See extensive PMH above    Examination-Activity Limitations Bathing;Bed Mobility;Bend;Caring for Others;Carry;Lift;Locomotion Level;Squat;Stand;Stairs;Sit    Examination-Participation Restrictions Cleaning;Community Activity;Occupation;Shop    Stability/Clinical Decision Making Stable/Uncomplicated    Rehab Potential Good    PT Frequency 2x / week    PT Duration 8  weeks    PT Treatment/Interventions ADLs/Self Care Home Management;Neuromuscular re-education;Balance  training;Therapeutic exercise;Therapeutic activities;Functional mobility training;Stair training;Gait training;Iontophoresis 4mg /ml Dexamethasone;Aquatic Therapy;Cryotherapy;Electrical Stimulation;Moist Heat;Traction;Ultrasound;Manual techniques;Patient/family education;Taping;Passive range of motion;Dry needling    PT Next Visit Plan Review HEP, manual techniques (STM and joint mobilizations as indicated), , low back/QL stretches, core stabilization. Consider TPDN to lumbar paraspinals, QL.    PT Home Exercise Plan Access Code:    Consulted and Agree with Plan of Care Patient           Patient will benefit from skilled therapeutic intervention in order to improve the following deficits and impairments:  Decreased endurance,Decreased strength,Decreased mobility,Decreased range of motion,Difficulty walking,Decreased activity tolerance,Increased muscle spasms,Pain,Obesity  Visit Diagnosis: Chronic left-sided low back pain, unspecified whether sciatica present  Muscle spasm of back  Muscle weakness (generalized)  Difficulty in walking, not elsewhere classified     Problem List Patient Active Problem List   Diagnosis Date Noted  . Puncture wound of right foot 02/08/2020  . Sinusitis 10/31/2019  . Urine finding 10/31/2019  . Microalbuminuria 05/27/2018  . Hyperlipidemia 05/24/2017  . Inflammatory disorder of digestive tract 05/24/2017  . Low back pain 05/24/2017  . Type 2 diabetes mellitus with neurologic complication, without long-term current use of insulin (HCC) 05/24/2017  . Vitamin D deficiency 05/24/2017  . Otalgia of both ears 06/09/2016  . Temporomandibular joint (TMJ) pain 06/09/2016  . 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 Obesity 03/21/2015   05/21/2015, PT, DPT, ATC 12/18/20 4:49 PM [facility] Outpatient Rehabilitation [facility] [address] [place], [place], [place] Phone: [phone]   Fax:  [phone]  Name: Maryam Feely MRN: Ivor Reining Date of Birth: 1971/08/02

## 2020-12-24 ENCOUNTER — Other Ambulatory Visit: Payer: Self-pay

## 2020-12-24 ENCOUNTER — Ambulatory Visit: Payer: BC Managed Care – PPO | Admitting: Physical Therapy

## 2020-12-24 ENCOUNTER — Encounter: Payer: Self-pay | Admitting: Physical Therapy

## 2020-12-24 DIAGNOSIS — M6281 Muscle weakness (generalized): Secondary | ICD-10-CM

## 2020-12-24 DIAGNOSIS — M62830 Muscle spasm of back: Secondary | ICD-10-CM

## 2020-12-24 DIAGNOSIS — R262 Difficulty in walking, not elsewhere classified: Secondary | ICD-10-CM

## 2020-12-24 DIAGNOSIS — M545 Low back pain, unspecified: Secondary | ICD-10-CM | POA: Diagnosis not present

## 2020-12-24 DIAGNOSIS — G8929 Other chronic pain: Secondary | ICD-10-CM

## 2020-12-24 NOTE — Therapy (Signed)
[facility] Outpatient Rehabilitation [facility] [address] [place], [place], [address] Phone: [phone]   Fax:  [phone]  Physical Therapy Treatment  Patient Details  Name: Erica [name] MRN: [id number] Date of Birth: 1971-03-24 Referring Provider (PT): Lew Dawes, [place]   Encounter Date: 12/24/2020   PT End of Session - 12/24/20 6073    Visit Number 3    Number of Visits 17    Date for PT Re-Evaluation 02/08/21    Authorization Type BCBS - FOTO at visit 6 and visit 10    PT Start Time 0715    PT Stop Time 0800    PT Time Calculation (min) 45 min           Past Medical History:  Diagnosis Date  . Arthritis   . Depression   . Diabetes mellitus   . Fatty liver   . Hernia, abdominal   . Hypercholesteremia   . IBS (irritable bowel syndrome)     Past Surgical History:  Procedure Laterality Date  . CESAREAN SECTION    . CHOLECYSTECTOMY      There were no vitals filed for this visit.   Subjective Assessment - 12/24/20 0718    Subjective Pt reports back is 5-6/10 at rest.    Currently in Pain? Yes    Pain Score 5     Pain Location Back    Pain Orientation Left;Lower    Pain Descriptors / Indicators Dull;Constant   some episodes of piercing pain   Pain Type Acute pain    Aggravating Factors  turning over in bed    Pain Relieving Factors prone lying, pain meds                             [facility] Adult PT Treatment/Exercise - 12/24/20 0001      Lumbar Exercises: Stretches   Single Knee to Chest Stretch 2 reps;30 seconds    Lower Trunk Rotation Limitations 2 min    Figure 4 Stretch Limitations 1 min each    Other Lumbar Stretch Exercise glute stretch 1 min each      Lumbar Exercises: Supine   Pelvic Tilt 10 reps    Pelvic Tilt Limitations mod cues for technique    Bent Knee Raise 10 reps    Bent Knee Raise Limitations With PPT, cues for breathing      Modalities   Modalities Electrical Stimulation;Moist  Heat      Moist Heat Therapy   Number Minutes Moist Heat 15 Minutes    Moist Heat Location Lumbar Spine   prone     Electrical Stimulation   Electrical Stimulation Location Lumbar-left side   prone   Electrical Stimulation Action IFC x 15 minutes    Electrical Stimulation Parameters 84mA    Electrical Stimulation Goals Pain                    PT Short Term Goals - 12/10/20 1515      PT SHORT TERM GOAL #1   Title Patient will be independent with initial HEP.    Baseline Intiial HEP will be issued at patient's first follow up visit after evaluation.    Time 4    Period Weeks    Status New    Target Date 01/07/21      PT SHORT TERM GOAL #2   Title Patient will be able to perform lumbar AROM in all planes with </=  3/10 L LBP.    Baseline 5/10 at rest with slightly increased pain during AROM into FL, EXT, L SB, and L ROT    Time 4    Period Weeks    Status New    Target Date 01/07/21      PT SHORT TERM GOAL #3   Title Patient's FOTO score will improve from 50% function to at least 55% function to demonstrate improvement in perceived ability.    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    Baseline Intiial HEP will be issued at patient's first follow up visit after evaluation.    Time 8    Period Weeks    Status New    Target Date 02/04/21      PT LONG TERM GOAL #2   Title Patient will be able to perform lumbar AROM WFL in  all planes with </= 1/10 L LBP.    Baseline 5/10 at rest with slightly increased pain during AROM into FL, EXT, L SB, and L ROT    Time 8    Period Weeks    Status New    Target Date 02/04/21      PT LONG TERM GOAL #3   Title Patient's FOTO score will improve from 50% function to at least 63% function to demonstrate improvement in perceived ability.    Baseline 50% function; predicted 63% function    Time 8    Period Weeks    Status New    Target Date 02/04/21      PT LONG TERM GOAL #4   Title Patient will be able to perform household and work activities without L LBP.    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 She rates her pain at 5/10 in resting. Reviewed her HEP and began core activation. Pt required mod cues to perfrom correctly. Trial of IFC with HMP to decrease pain. Afterward she reported decreased pain.    PT Next Visit Plan Review HEP, assess benefit of IFC and repeat prn, consider quadruped manual techniques (STM and joint mobilizations as indicated), , low back/QL stretches, core stabilization. Consider TPDN to lumbar paraspinals, QL.    PT Home Exercise Plan Access Code: [email or web address]           Patient will benefit from skilled therapeutic intervention in  order to improve the following deficits and impairments:  Decreased endurance,Decreased strength,Decreased mobility,Decreased range of motion,Difficulty walking,Decreased activity tolerance,Increased muscle spasms,Pain,Obesity  Visit Diagnosis: Chronic left-sided low back pain, unspecified whether sciatica present  Muscle weakness (generalized)  Difficulty in walking, not elsewhere classified  Muscle spasm of back     Problem  List Patient Active Problem List   Diagnosis Date Noted  . Puncture wound of right foot 02/08/2020  . Sinusitis 10/31/2019  . Urine finding 10/31/2019  . Microalbuminuria 05/27/2018  . Hyperlipidemia 05/24/2017  . Inflammatory disorder of digestive tract 05/24/2017  . Low back pain 05/24/2017  . Type 2 diabetes mellitus with neurologic complication, without long-term current use of insulin (HCC) 05/24/2017  . Vitamin D deficiency 05/24/2017  . Otalgia of both ears 06/09/2016  . Temporomandibular joint (TMJ) pain 06/09/2016  . 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 Obesity 03/21/2015    Sherrie Mustache , PTA 12/24/2020, 8:06 AM  [facility] [address] [place], [place], [address] Phone: [phone]   Fax:  [phone]  Name: Erica [name] MRN: [id number] Date of Birth: Dec 18, 1970

## 2020-12-26 ENCOUNTER — Encounter: Payer: Self-pay | Admitting: Physical Therapy

## 2020-12-26 ENCOUNTER — Ambulatory Visit: Payer: BC Managed Care – PPO | Admitting: Physical Therapy

## 2020-12-26 ENCOUNTER — Other Ambulatory Visit: Payer: Self-pay

## 2020-12-26 DIAGNOSIS — G8929 Other chronic pain: Secondary | ICD-10-CM

## 2020-12-26 DIAGNOSIS — M6281 Muscle weakness (generalized): Secondary | ICD-10-CM

## 2020-12-26 DIAGNOSIS — M545 Low back pain, unspecified: Secondary | ICD-10-CM | POA: Diagnosis not present

## 2020-12-26 DIAGNOSIS — M62830 Muscle spasm of back: Secondary | ICD-10-CM

## 2020-12-26 DIAGNOSIS — R262 Difficulty in walking, not elsewhere classified: Secondary | ICD-10-CM

## 2020-12-26 NOTE — Therapy (Signed)
[facility] Outpatient Rehabilitation [facility] [address] [place], [place], [address] Phone: [phone]   Fax:  [phone]  Physical Therapy Treatment  Patient Details  Name: Erica [name] MRN: [id number] Date of Birth: 22-May-1971 Referring Provider (PT): Lew Dawes, [place]   Encounter Date: 12/26/2020   PT End of Session - 12/26/20 1779    Visit Number 4    Number of Visits 17    Date for PT Re-Evaluation 02/08/21    Authorization Type BCBS - FOTO at visit 6 and visit 10    PT Start Time 0803    PT Stop Time 0857    PT Time Calculation (min) 54 min           Past Medical History:  Diagnosis Date  . Arthritis   . Depression   . Diabetes mellitus   . Fatty liver   . Hernia, abdominal   . Hypercholesteremia   . IBS (irritable bowel syndrome)     Past Surgical History:  Procedure Laterality Date  . CESAREAN SECTION    . CHOLECYSTECTOMY      There were no vitals filed for this visit.   Subjective Assessment - 12/26/20 0807    Subjective Pt reports she is about the same at 6/10 pain. She reports the [facility] treatment was helpful for the day last session.    Currently in Pain? Yes    Pain Score 6     Pain Location Back    Pain Orientation Lower    Pain Descriptors / Indicators Dull;Sharp    Pain Type Chronic pain;Acute pain                             [facility] Adult PT Treatment/Exercise - 12/26/20 0001      Lumbar Exercises: Stretches   Single Knee to Chest Stretch 2 reps;30 seconds    Lower Trunk Rotation Limitations 2 min    Quadruped Mid Back Stretch Limitations modified seated childs pose with physioball- added laterals    Other Lumbar Stretch Exercise glute stretch 1 min each      Lumbar Exercises: Supine   Pelvic Tilt 10 reps    Pelvic Tilt Limitations mod cues for technique    Clam 20 reps    Clam Limitations green band    Large Ball Abdominal Isometric 10 reps    Large Ball Abdominal Isometric  Limitations 5 sec , mod cues      Moist Heat Therapy   Number Minutes Moist Heat 15 Minutes    Moist Heat Location Lumbar Spine   prone     Electrical Stimulation   Electrical Stimulation Location Lumbar-left side   prone   Electrical Stimulation Action IFC x 15 min    Electrical Stimulation Parameters 20 mA    Electrical Stimulation Goals Pain                    PT Short Term Goals - 12/10/20 1515      PT SHORT TERM GOAL #1   Title Patient will be independent with initial HEP.    Baseline Intiial HEP will be issued at patient's first follow up visit after evaluation.    Time 4    Period Weeks    Status New    Target Date 01/07/21      PT SHORT TERM GOAL #2   Title Patient will be able to perform lumbar AROM in all planes with </=  3/10 L LBP.    Baseline 5/10 at rest with slightly increased pain during AROM into FL, EXT, L SB, and L ROT    Time 4    Period Weeks    Status New    Target Date 01/07/21      PT SHORT TERM GOAL #3   Title Patient's FOTO score will improve from 50% function to at least 55% function to demonstrate improvement in perceived ability.    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    Baseline Intiial HEP will be issued at patient's first follow up visit after evaluation.    Time 8    Period Weeks    Status New    Target Date 02/04/21      PT LONG TERM GOAL #2   Title Patient will be able to  perform lumbar AROM WFL in all planes with </= 1/10 L LBP.    Baseline 5/10 at rest with slightly increased pain during AROM into FL, EXT, L SB, and L ROT    Time 8    Period Weeks    Status New    Target Date 02/04/21      PT LONG TERM GOAL #3   Title Patient's FOTO score will improve from 50% function to at least 63% function to demonstrate improvement in perceived ability.    Baseline 50% function; predicted 63% function    Time 8    Period Weeks    Status New    Target Date 02/04/21      PT LONG TERM GOAL #4   Title Patient will be able to perform household and work activities without L LBP.    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 Added modified seated childs pose and side bend stretching with physioball. Used physioball press for isometric abdominal activation training. Repeated IFC at end of session with HMP.    PT Next Visit Plan Review HEP, assess benefit of IFC and repeat prn, consider quadruped manual techniques (STM and joint mobilizations as indicated), , low back/QL stretches, core stabilization. Consider TPDN to lumbar paraspinals, QL.    PT Home Exercise Plan Access Code: [email or web address]           Patient will benefit from skilled therapeutic intervention in order to improve the following deficits and impairments:  Decreased endurance,Decreased strength,Decreased mobility,Decreased range of motion,Difficulty walking,Decreased activity tolerance,Increased muscle spasms,Pain,Obesity  Visit Diagnosis: Chronic left-sided low back pain, unspecified whether sciatica present  Muscle weakness (generalized)  Difficulty in walking, not elsewhere classified  Muscle spasm of  back     Problem List Patient Active Problem List   Diagnosis Date Noted  . Puncture wound of right foot 02/08/2020  . Sinusitis 10/31/2019  . Urine finding 10/31/2019  . Microalbuminuria 05/27/2018  . Hyperlipidemia 05/24/2017  . Inflammatory disorder of digestive tract 05/24/2017  . Low back pain 05/24/2017  . Type 2 diabetes mellitus with neurologic complication, without long-term current use of insulin (HCC) 05/24/2017  . Vitamin D deficiency 05/24/2017  . Otalgia of both ears 06/09/2016  . Temporomandibular joint (TMJ) pain 06/09/2016  . 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 Obesity 03/21/2015    Sherrie Mustache, PTA 12/26/2020, 8:43 AM  [facility] [address] [place], [place], [address] Phone: [phone]   Fax:  [phone]  Name: Sabrea Sankey MRN: [id number] Date of Birth: September 02, 1971

## 2021-01-01 ENCOUNTER — Ambulatory Visit: Payer: BC Managed Care – PPO | Admitting: Physical Therapy

## 2021-01-07 ENCOUNTER — Ambulatory Visit: Payer: BC Managed Care – PPO

## 2021-01-09 ENCOUNTER — Ambulatory Visit: Payer: BC Managed Care – PPO

## 2021-01-09 ENCOUNTER — Other Ambulatory Visit: Payer: Self-pay

## 2021-01-09 DIAGNOSIS — R262 Difficulty in walking, not elsewhere classified: Secondary | ICD-10-CM

## 2021-01-09 DIAGNOSIS — M545 Low back pain, unspecified: Secondary | ICD-10-CM | POA: Diagnosis not present

## 2021-01-09 DIAGNOSIS — M6281 Muscle weakness (generalized): Secondary | ICD-10-CM

## 2021-01-09 DIAGNOSIS — G8929 Other chronic pain: Secondary | ICD-10-CM

## 2021-01-09 DIAGNOSIS — M62830 Muscle spasm of back: Secondary | ICD-10-CM

## 2021-01-09 NOTE — Therapy (Signed)
[facility] [address] [place], [place], [address] Phone: [phone]   Fax:  [phone]  Physical Therapy Treatment  Patient Details  Name: Erica [name] MRN: [id number] Date of Birth: 1971-02-18 Referring Provider (PT): Lew Dawes, [place]   Encounter Date: 01/09/2021   PT End of Session - 01/09/21 1821    Visit Number 5    Number of Visits 17    Date for PT Re-Evaluation 02/08/21    Authorization Type BCBS - FOTO at visit 6 and visit 10    PT Start Time 1804    PT Stop Time 1845    PT Time Calculation (min) 41 min    Activity Tolerance Patient tolerated treatment well    Behavior During Therapy [facility] for tasks assessed/performed           Past Medical History:  Diagnosis Date  . Arthritis   . Depression   . Diabetes mellitus   . Fatty liver   . Hernia, abdominal   . Hypercholesteremia   . IBS (irritable bowel syndrome)     Past Surgical History:  Procedure Laterality Date  . CESAREAN SECTION    . CHOLECYSTECTOMY      There were no vitals filed for this visit.   Subjective Assessment - 01/09/21 1805    Subjective Patient reports the back is about the same. She moved last weekend and didn't do a lot of heavy lifting, but still did a lot carrying of smaller items. She admits to not completing her HEP. She reports she has pain medication, but is unsure what to take with her sleep medication.    Currently in Pain? Yes    Pain Score 6     Pain Location Back    Pain Orientation Lower;Left    Pain Descriptors / Indicators Dull    Pain Onset More than a month ago                             [facility] Adult PT Treatment/Exercise - 01/09/21 0001      Lumbar Exercises: Stretches   Lower Trunk Rotation Limitations 2 min      Lumbar Exercises: Seated   Other Seated Lumbar Exercises 3 way stability ball rollout 2 min    Other Seated Lumbar Exercises seated pelvic tilt 2 x 10       Lumbar Exercises: Supine   Clam Limitations 2 x 15 green TB    Bridge Limitations 2 x 10 partial range    Other Supine Lumbar Exercises hip adduction isometric 2 x 10;5 sec hold      Manual Therapy   Manual therapy comments STM/DTM to Lt lumbar paraspinals, QL, posterior gluteal musculature                  PT Education - 01/09/21 1836    Education Details Pain Neuroscience education, recommendation to f/u with PCP regarding pain medication. TPDN indications, expectations, side effects. importance of complying with prescribed HEP.    Person(s) Educated Patient    Methods Explanation    Comprehension Verbalized understanding            PT Short Term Goals - 12/10/20 1515      PT SHORT TERM GOAL #1   Title Patient will be independent with initial HEP.    Baseline Intiial HEP will be issued at patient's first follow up visit after evaluation.    Time 4  Period Weeks    Status New    Target Date 01/07/21      PT SHORT TERM GOAL #2   Title Patient will be able to perform lumbar AROM in all planes with </= 3/10 L LBP.    Baseline 5/10 at rest with slightly increased pain during AROM into FL, EXT, L SB, and L ROT    Time 4    Period Weeks    Status New    Target Date 01/07/21      PT SHORT TERM GOAL #3   Title Patient's FOTO score will improve from 50% function to at least 55% function to demonstrate improvement in perceived ability.    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    Baseline Intiial HEP will be issued at patient's first follow up visit after evaluation.    Time 8    Period Weeks    Status New    Target Date 02/04/21      PT LONG TERM GOAL #2   Title Patient will be able to perform lumbar AROM WFL in all planes with </= 1/10 L LBP.    Baseline 5/10 at rest with slightly increased pain during AROM into FL, EXT, L SB, and L ROT    Time 8    Period Weeks    Status New    Target Date 02/04/21      PT LONG TERM GOAL #3   Title Patient's FOTO score will improve from 50% function to at least 63% function to demonstrate improvement in perceived ability.    Baseline 50% function; predicted 63% function    Time 8    Period Weeks    Status New    Target Date 02/04/21      PT LONG TERM GOAL #4   Title Patient will be able to perform household and work activities without L LBP.    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 Able to gradually progress core and hip strengthening with patient able to complete partial range of strengthening exercises secondary to weakness. Began pain neuroscience education encouraging patient to begin gradual progression of mobility to help reduce her overall pain and encouraged her to comply with prescribed HEP. Patient also recommended to f/u with PCP regarding pain medication usage as she is unsure what is safe to take  with her current sleep medication.    PT Treatment/Interventions ADLs/Self Care Home Management;Neuromuscular re-education;Balance training;Therapeutic exercise;Therapeutic activities;Functional mobility training;Stair training;Gait training;Iontophoresis 4mg /ml  Dexamethasone;Aquatic Therapy;Cryotherapy;Electrical Stimulation;Moist Heat;Traction;Ultrasound;Manual techniques;Patient/family education;Taping;Passive range of motion;Dry needling    PT Next Visit Plan low back/QL stretches, core stabilization/hip strengthening. Consider TPDN/manual therapy. to lumbar paraspinals, QL. update HEP.    PT Home Exercise Plan Access Code:           Patient will benefit from skilled therapeutic intervention in order to improve the following deficits and impairments:  Decreased endurance,Decreased strength,Decreased mobility,Decreased range of motion,Difficulty walking,Decreased activity tolerance,Increased muscle spasms,Pain,Obesity  Visit Diagnosis: Chronic left-sided low back pain, unspecified whether sciatica present  Muscle weakness (generalized)  Difficulty in walking, not elsewhere classified  Muscle spasm of back     Problem List Patient Active Problem List   Diagnosis Date Noted  . Puncture wound of right foot 02/08/2020  . Sinusitis 10/31/2019  . Urine finding 10/31/2019  . Microalbuminuria 05/27/2018  . Hyperlipidemia 05/24/2017  . Inflammatory disorder of digestive tract 05/24/2017  . Low back pain 05/24/2017  . Type 2 diabetes mellitus with neurologic complication, without long-term current use of insulin (HCC) 05/24/2017  . Vitamin D deficiency 05/24/2017  . Otalgia of both ears 06/09/2016  . Temporomandibular joint (TMJ) pain 06/09/2016  . 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 Obesity 03/21/2015   05/21/2015, PT, DPT, ATC 01/09/21 7:48 PM  [facility] [address] [place], [place], [place] Phone: [phone]   Fax:  [phone]  Name: Erica [name] MRN: Ivor Reining Date of Birth: January 28, 1971

## 2021-01-14 ENCOUNTER — Ambulatory Visit: Payer: BC Managed Care – PPO | Attending: Emergency Medicine

## 2021-01-14 ENCOUNTER — Other Ambulatory Visit: Payer: Self-pay

## 2021-01-14 DIAGNOSIS — R262 Difficulty in walking, not elsewhere classified: Secondary | ICD-10-CM | POA: Insufficient documentation

## 2021-01-14 DIAGNOSIS — G8929 Other chronic pain: Secondary | ICD-10-CM | POA: Diagnosis present

## 2021-01-14 DIAGNOSIS — M545 Low back pain, unspecified: Secondary | ICD-10-CM | POA: Insufficient documentation

## 2021-01-14 DIAGNOSIS — M62830 Muscle spasm of back: Secondary | ICD-10-CM | POA: Diagnosis present

## 2021-01-14 DIAGNOSIS — M6281 Muscle weakness (generalized): Secondary | ICD-10-CM | POA: Insufficient documentation

## 2021-01-15 NOTE — Therapy (Signed)
[place], [place], [address] Phone: [phone]   Fax:  [phone]  Physical Therapy Treatment  Patient Details  Name: Erica [name] MRN: [id number] Date of Birth: 08/24/1971 Referring Provider (PT): Janith Lima, [place]   Encounter Date: 01/14/2021   PT End of Session - 01/14/21 1752    Visit Number 6    Number of Visits 17    Date for PT Re-Evaluation 02/08/21    Authorization Type BCBS - FOTO at visit 10    PT Start Time 1752   pt arrived late then was in restroom   PT Stop Time 1832    PT Time Calculation (min) 40 min    Activity Tolerance Patient tolerated treatment well    Behavior During Therapy [facility] for tasks assessed/performed           Past Medical History:  Diagnosis Date  . Arthritis   . Depression   . Diabetes mellitus   . Fatty liver   . Hernia, abdominal   . Hypercholesteremia   . IBS (irritable bowel syndrome)     Past Surgical History:  Procedure Laterality Date  . CESAREAN SECTION    . CHOLECYSTECTOMY      There were no vitals filed for this visit.   Subjective Assessment - 01/15/21 0146    Subjective Patient states her pain is the same and that "it's always hurting". She reports lacking motivation due to having so many personal factors going on in her life currently.    Pertinent History See extensive PMH above    Limitations Sitting;Standing;Walking;House hold activities;Lifting    How long can you sit comfortably? "It's just constant"    How long can you stand comfortably? "It's just constant"    How long can you walk comfortably? "It's just constant"    Patient Stated Goals "I would love my energy to be better eventually"    Currently in Pain? Yes    Pain Score 5     Pain Location Back    Pain Orientation Lower;Left    Pain Descriptors / Indicators Dull    Pain Onset More than a month ago              [facility] PT Assessment - 01/14/21 0001       Assessment   Medical Diagnosis L low back pain (lumbar)    Referring Provider (PT) Shelton Silvas                         [facility] Adult PT Treatment/Exercise - 01/15/21 0001      Self-Care   Self-Care Other Self-Care Comments    Other Self-Care Comments  Discussed importance of mental/emotional well-being to help patient with her physical health as well - advised pt to consider speaking with an appropriate professional if comfortable and/or finding a healthy way to cope that works for her. Pt shares that she has to pick and choose what to do for herself as she stays busy with work and other priorities, which also take a toll on her overall health. Pt shares her noncompliance with HEP due to her lack of motivation - encouraged to perform HEP and increase activity for overall well-being.                  PT Education - 01/15/21 0147    Education Details Discussed importance of mental/emotional well-being to help patient with her physical health as  well - advised pt to consider speaking with an appropriate professional if comfortable and/or finding a healthy way to cope that works for her. Pt shares that she has to pick and choose what to do for herself as she stays busy with work and other priorities, which also take a toll on her overall health. Pt shares her noncompliance with HEP due to her lack of motivation - encouraged to perform HEP and increase activity for overall well-being.    Person(s) Educated Patient    Methods Explanation    Comprehension Verbalized understanding            PT Short Term Goals - 01/15/21 0130      PT SHORT TERM GOAL #1   Title Patient will be independent with initial HEP.    Baseline Pt states she performs some stretches but has otherwise been noncompliant secondary to lack of motivation    Time 4    Period Weeks    Status Partially Met    Target Date 01/07/21      PT SHORT TERM GOAL #2   Title Patient will be able to  perform lumbar AROM in all planes with </= 3/10 L LBP.    Baseline 5/10 at rest with slightly increased pain during AROM into FL, EXT, L SB, and L ROT. 01/14/2021: pt continues to have constant pain    Time 4    Period Weeks    Status On-going    Target Date 01/07/21      PT SHORT TERM GOAL #3   Title Patient's FOTO score will improve from 50% function to at least 55% function to demonstrate improvement in perceived ability.    Baseline 50% function; predicted 63% function. 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 01/14/2021: pt with continued pain throughout her day    Time 4    Period Weeks    Status On-going    Target Date 01/07/21             PT Long Term Goals - 12/10/20 1528      PT LONG TERM GOAL #1   Title Patient will be independent with advanced HEP.    Baseline Intiial HEP will be issued at patient's first follow up visit after evaluation.    Time 8    Period Weeks    Status New    Target Date 02/04/21      PT LONG TERM GOAL #2   Title Patient will be able to perform lumbar AROM WFL in all planes with </= 1/10 L LBP.    Baseline 5/10 at rest with slightly increased pain during AROM into FL, EXT, L SB, and L ROT    Time 8    Period Weeks    Status New    Target Date 02/04/21      PT LONG TERM GOAL #3   Title Patient's FOTO score will improve from 50% function to at least 63% function to demonstrate improvement in perceived ability.    Baseline 50% function; predicted 63% function    Time 8    Period  Weeks    Status New    Target Date 02/04/21      PT LONG TERM GOAL #4   Title Patient will be able to perform household and work activities  without L LBP.    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 Continued focus on core and hip strengthening as well as low back stretching. She may benefit from skilled PT to continue addressing deficits but will experienced lasting improvement if she is able to address other factors that are currently limiting her, such as her sleeping pattern and emotional well-being.    Personal Factors and Comorbidities Comorbidity 3+;Past/Current Experience;Time since onset of injury/illness/exacerbation;Profession;Fitness    Comorbidities See extensive PMH above    Examination-Activity Limitations Bathing;Bed Mobility;Bend;Caring for Others;Carry;Lift;Locomotion Level;Squat;Stand;Stairs;Sit    Examination-Participation Restrictions Cleaning;Community Activity;Occupation;Shop    PT Treatment/Interventions ADLs/Self Care Home Management;Neuromuscular re-education;Balance training;Therapeutic exercise;Therapeutic activities;Functional mobility training;Stair training;Gait training;Iontophoresis 69m/ml Dexamethasone;Aquatic Therapy;Cryotherapy;Electrical Stimulation;Moist Heat;Traction;Ultrasound;Manual techniques;Patient/family education;Taping;Passive range of motion;Dry needling    PT Next Visit Plan FOTO and update STG about FOTO. low back/QL stretches, core stabilization/hip strengthening. Consider TPDN/manual therapy. to lumbar paraspinals, QL. update HEP.    PT Home Exercise Plan Access Code: [email or web address]   Consulted and Agree with Plan of Care Patient           Patient will benefit from skilled therapeutic intervention in order to improve the following deficits and impairments:  Decreased endurance,Decreased  strength,Decreased mobility,Decreased range of motion,Difficulty walking,Decreased activity tolerance,Increased muscle spasms,Pain,Obesity  Visit Diagnosis: Chronic left-sided low back pain, unspecified whether sciatica present  Muscle weakness (generalized)  Difficulty in walking, not elsewhere classified  Muscle spasm of back     Problem List Patient Active Problem List   Diagnosis Date Noted  . Puncture wound of right foot 02/08/2020  . Sinusitis 10/31/2019  . Urine finding 10/31/2019  . Microalbuminuria 05/27/2018  . Hyperlipidemia 05/24/2017  . Inflammatory disorder of digestive tract 05/24/2017  . Low back pain 05/24/2017  . Type 2 diabetes mellitus with neurologic complication, without long-term current use of insulin ([address] 05/24/2017  . Vitamin D deficiency 05/24/2017  . Otalgia of both ears 06/09/2016  . Temporomandibular joint (TMJ) pain 06/09/2016  . 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 Obesity 03/21/2015      KHaydee Monica PT, DPT 01/15/21 2:04 AM  [facility]192[address] CourtGCape [place] [address]Phone: [phone]  Fax:  [phone] Name: MRomesha SchererMRN: [id number] of Birth: 7Nov 08, 1972

## 2021-01-16 ENCOUNTER — Ambulatory Visit: Payer: BC Managed Care – PPO

## 2021-01-20 ENCOUNTER — Ambulatory Visit: Payer: BC Managed Care – PPO

## 2021-01-21 ENCOUNTER — Ambulatory Visit
Admission: RE | Admit: 2021-01-21 | Discharge: 2021-01-21 | Disposition: A | Discharge status: Home / Self Care | Payer: BC Managed Care – PPO | Source: Ambulatory Visit | Attending: Obstetrics | Admitting: Obstetrics

## 2021-01-21 DIAGNOSIS — R9389 Abnormal findings on diagnostic imaging of other specified body structures: Secondary | ICD-10-CM

## 2021-01-22 ENCOUNTER — Ambulatory Visit: Payer: BC Managed Care – PPO

## 2021-01-23 ENCOUNTER — Encounter: Payer: Self-pay | Admitting: Obstetrics

## 2021-01-23 ENCOUNTER — Ambulatory Visit: Payer: BC Managed Care – PPO | Admitting: Obstetrics

## 2021-01-23 ENCOUNTER — Other Ambulatory Visit (HOSPITAL_COMMUNITY)
Admission: RE | Admit: 2021-01-23 | Discharge: 2021-01-23 | Disposition: A | Discharge status: Home / Self Care | Payer: BC Managed Care – PPO | Source: Ambulatory Visit | Attending: Obstetrics | Admitting: Obstetrics

## 2021-01-23 ENCOUNTER — Other Ambulatory Visit: Payer: Self-pay

## 2021-01-23 VITALS — BP 145/88 | HR 88 | Temp 107.0°F | Ht 72.0 in | Wt 292.0 lb

## 2021-01-23 DIAGNOSIS — R9389 Abnormal findings on diagnostic imaging of other specified body structures: Secondary | ICD-10-CM | POA: Diagnosis not present

## 2021-01-23 DIAGNOSIS — Z01812 Encounter for preprocedural laboratory examination: Secondary | ICD-10-CM

## 2021-01-23 LAB — POCT URINE PREGNANCY: Preg Test, Ur: NEGATIVE

## 2021-01-23 NOTE — Progress Notes (Signed)
Patient presents for F/U after U/S pt had U/S on 01/21/21.  Endometrial bx today per provider UPT Negative Consent signed.

## 2021-01-23 NOTE — Progress Notes (Signed)
Endometrial Biopsy Procedure Note  Pre-operative Diagnosis: Endometrial Thickening on Ultrasound  Post-operative Diagnosis: same  Indications: Endometrial Thickening on Ultrasound  Procedure Details   Urine pregnancy test was done in office and result was negative.  The risks (including infection, bleeding, pain, and uterine perforation) and benefits of the procedegativeure were explained to the patient and Written informed consent was obtained.    The patient was placed in the dorsal lithotomy position.  Bimanual exam showed the uterus to be in the anteroflexed position.  A Graves' speculum inserted in the vagina, and the cervix prepped with povidone iodine.  Endocervical curettage with a Kevorkian curette was not performed.   A sharp tenaculum was applied to the anterior lip of the cervix for stabilization.  A sterile uterine sound was used to sound the uterus to a depth of 9.5cm.  A Pipelle endometrial aspirator was used to sample the endometrium.  Sample was sent for pathologic examination.  Condition: Stable  Complications: None  Plan: Follow up in 2 weeks for results and management  plans The patient was advised to call for any fever or for prolonged or severe pain or bleeding. She was advised to use NSAID as needed for mild to moderate pain. She was advised to avoid vaginal intercourse for 48 hours or until the bleeding has completely stopped.  Attending Physician Documentation: I was present for or participated in the entire procedure, including opening and closing.  Brock Bad, MD 01/23/2021 4:43 PM

## 2021-01-24 ENCOUNTER — Ambulatory Visit: Payer: BC Managed Care – PPO | Admitting: Physical Therapy

## 2021-01-27 ENCOUNTER — Ambulatory Visit: Payer: BC Managed Care – PPO | Admitting: Physical Therapy

## 2021-01-27 ENCOUNTER — Encounter: Payer: Self-pay | Admitting: Physical Therapy

## 2021-01-27 ENCOUNTER — Other Ambulatory Visit: Payer: Self-pay

## 2021-01-27 DIAGNOSIS — R262 Difficulty in walking, not elsewhere classified: Secondary | ICD-10-CM

## 2021-01-27 DIAGNOSIS — M62830 Muscle spasm of back: Secondary | ICD-10-CM

## 2021-01-27 DIAGNOSIS — M545 Low back pain, unspecified: Secondary | ICD-10-CM | POA: Diagnosis not present

## 2021-01-27 DIAGNOSIS — M6281 Muscle weakness (generalized): Secondary | ICD-10-CM

## 2021-01-27 LAB — SURGICAL PATHOLOGY

## 2021-01-27 NOTE — Therapy (Signed)
[place], [place], [address] Phone: [phone]   Fax:  [phone]  Physical Therapy Treatment  Patient Details  Name: Erica [name] MRN: [id number] Date of Birth: February 27, 1971 Referring Provider (PT): Janith Lima, [place]   Encounter Date: 01/27/2021   PT End of Session - 01/27/21 0726    Visit Number 7    Number of Visits 17    Date for PT Re-Evaluation 02/08/21    Authorization Type BCBS - FOTO at visit 10    PT Start Time 0718    PT Stop Time 0800    PT Time Calculation (min) 42 min           Past Medical History:  Diagnosis Date  . Arthritis   . Depression   . Diabetes mellitus   . Fatty liver   . Hernia, abdominal   . Hypercholesteremia   . IBS (irritable bowel syndrome)     Past Surgical History:  Procedure Laterality Date  . CESAREAN SECTION    . CHOLECYSTECTOMY      There were no vitals filed for this visit.   Subjective Assessment - 01/27/21 0722    Subjective I worked the [employer]. My pain is a 7/10. I have other things that I am dealing with too.    Currently in Pain? Yes    Pain Score 7     Pain Location Back    Pain Orientation Left    Pain Descriptors / Indicators Dull    Pain Type Chronic pain;Acute pain    Aggravating Factors  working, emotional factors                             [facility] Adult PT Treatment/Exercise - 01/27/21 0001      Therapeutic Activites    Therapeutic Activities Lifting    Lifting use of dowel for lifting, bending 2 x 10 sit-stands      Lumbar Exercises: Stretches   Single Knee to Chest Stretch 3 reps    Lower Trunk Rotation Limitations 2 min    Quadruped Mid Back Stretch Limitations modified seated childs pose with physioball- added laterals      Lumbar Exercises: Aerobic   Nustep L5 x 7 min LE/UE      Lumbar Exercises: Supine   Isometric Hip Flexion 5 reps;5 seconds    Large Ball Abdominal Isometric 10  reps    Large Ball Abdominal Isometric Limitations 5 sec , mod cues      Moist Heat Therapy   Number Minutes Moist Heat 15 Minutes    Moist Heat Location Lumbar Spine      Electrical Stimulation   Electrical Stimulation Location Lumbar-left side   prone   Electrical Stimulation Action IFC x 15 min    Electrical Stimulation Parameters 71m    Electrical Stimulation Goals Pain                    PT Short Term Goals - 01/15/21 0130      PT SHORT TERM GOAL #1   Title Patient will be independent with initial HEP.    Baseline Pt states she performs some stretches but has otherwise been noncompliant secondary to lack of motivation    Time 4    Period Weeks    Status Partially Met    Target Date 01/07/21      PT SHORT TERM GOAL #2   Title  Patient will be able to perform lumbar AROM in all planes with </= 3/10 L LBP.    Baseline 5/10 at rest with slightly increased pain during AROM into FL, EXT, L SB, and L ROT. 01/14/2021: pt continues to have constant pain    Time 4    Period Weeks    Status On-going    Target Date 01/07/21      PT SHORT TERM GOAL #3   Title Patient's FOTO score will improve from 50% function to at least 55% function to demonstrate improvement in perceived ability.    Baseline 50% function; predicted 63% function. 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 01/14/2021: pt with continued pain throughout her day    Time 4    Period Weeks    Status On-going    Target Date 01/07/21             PT Long Term Goals - 12/10/20 1528      PT LONG TERM GOAL #1   Title Patient will be independent with advanced HEP.    Baseline Intiial HEP will  be issued at patient's first follow up visit after evaluation.    Time 8    Period Weeks    Status New    Target Date 02/04/21      PT LONG TERM GOAL #2   Title Patient will be able to perform lumbar AROM WFL in all planes with </= 1/10 L LBP.    Baseline 5/10 at rest with slightly increased pain during AROM into FL, EXT, L SB, and L ROT    Time 8    Period Weeks    Status New    Target Date 02/04/21      PT LONG TERM GOAL #3   Title Patient's FOTO score will improve from 50% function to at least 63% function to demonstrate improvement in perceived ability.    Baseline 50% function; predicted 63% function    Time 8    Period Weeks    Status New    Target Date 02/04/21      PT LONG TERM GOAL #4   Title Patient will be able to perform household and work activities without L LBP.    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 Session focused on hip hinge activity to progress toward LTGs of using correct body mechanics and reducing pain with CNA job duties. Pt able to correctly demonstrate hip hinge using dowel. IFC and HMP performed at end of session to reduce pain.    PT Next Visit Plan FOTO and update STG about FOTO. low back/QL stretches, core stabilization/hip strengthening. Consider TPDN/manual therapy. to lumbar paraspinals, QL. update HEP.    PT Home Exercise Plan Access Code: [email or web address]  Patient will benefit from skilled therapeutic intervention in order to improve the following deficits and impairments:  Decreased endurance,Decreased strength,Decreased mobility,Decreased range of motion,Difficulty walking,Decreased activity tolerance,Increased muscle spasms,Pain,Obesity  Visit Diagnosis: Chronic left-sided low back pain, unspecified whether  sciatica present  Muscle weakness (generalized)  Difficulty in walking, not elsewhere classified  Muscle spasm of back     Problem List Patient Active Problem List   Diagnosis Date Noted  . Puncture wound of right foot 02/08/2020  . Sinusitis 10/31/2019  . Urine finding 10/31/2019  . Microalbuminuria 05/27/2018  . Hyperlipidemia 05/24/2017  . Inflammatory disorder of digestive tract 05/24/2017  . Low back pain 05/24/2017  . Type 2 diabetes mellitus with neurologic complication, without long-term current use of insulin ([place]) 05/24/2017  . Vitamin D deficiency 05/24/2017  . Otalgia of both ears 06/09/2016  . Temporomandibular joint (TMJ) pain 06/09/2016  . 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 Obesity 03/21/2015    Dorene Ar, PTA 01/27/2021, 9:14 AM  [facility] [address] [place], [place], [address] Phone: [phone]   Fax:  [phone]  Name: Shantasia Hunnell MRN: [id number] Date of Birth: January 01, 1971

## 2021-01-30 ENCOUNTER — Encounter: Payer: Self-pay | Admitting: Physical Therapy

## 2021-01-30 ENCOUNTER — Ambulatory Visit: Payer: BC Managed Care – PPO | Admitting: Physical Therapy

## 2021-01-30 ENCOUNTER — Other Ambulatory Visit: Payer: Self-pay

## 2021-01-30 DIAGNOSIS — M62830 Muscle spasm of back: Secondary | ICD-10-CM

## 2021-01-30 DIAGNOSIS — M545 Low back pain, unspecified: Secondary | ICD-10-CM | POA: Diagnosis not present

## 2021-01-30 DIAGNOSIS — R262 Difficulty in walking, not elsewhere classified: Secondary | ICD-10-CM

## 2021-01-30 DIAGNOSIS — G8929 Other chronic pain: Secondary | ICD-10-CM

## 2021-01-30 DIAGNOSIS — M6281 Muscle weakness (generalized): Secondary | ICD-10-CM

## 2021-01-30 NOTE — Therapy (Signed)
[facility] [address] [place], [place], [address] Phone: [phone]   Fax:  [phone]  Physical Therapy Treatment  Patient Details  Name: Erica [name] MRN: [id number] Date of Birth: 02/23/1971 Referring Provider (PT): Wieters, Hallie C, PA-C   Encounter Date: 01/30/2021   PT End of Session - 01/30/21 0722    Visit Number 8    Number of Visits 17    Date for PT Re-Evaluation 02/08/21    Authorization Type BCBS - FOTO at visit 10    PT Start Time 0720    PT Stop Time 0758    PT Time Calculation (min) 38 min           Past Medical History:  Diagnosis Date  . Arthritis   . Depression   . Diabetes mellitus   . Fatty liver   . Hernia, abdominal   . Hypercholesteremia   . IBS (irritable bowel syndrome)     Past Surgical History:  Procedure Laterality Date  . CESAREAN SECTION    . CHOLECYSTECTOMY      There were no vitals filed for this visit.   Subjective Assessment - 01/30/21 0722    Subjective I am doing okay. But I am still dealing with my other issues. My whole body aches so it is hard to differentiate that and the low back pain.    Currently in Pain? Yes    Pain Score 5     Pain Location Back    Pain Orientation Left    Pain Descriptors / Indicators Dull    Pain Type Chronic pain    Aggravating Factors  emotional factors, everything is hard    Pain Relieving Factors prone lying , pain meds              [facility] PT Assessment - 01/30/21 0001      Observation/Other Assessments   Focus on Therapeutic Outcomes (FOTO)  51%   pt reports she did not answer initial survey correctly, so progress cannot be captured accurately.                        [facility] Adult PT Treatment/Exercise - 01/30/21 0001      Therapeutic Activites    Therapeutic Activities Lifting    Lifting use of dowel for lifting, bending 2 x 10 sit-stands      Lumbar Exercises: Stretches   Lower Trunk Rotation  Limitations 2 min    Standing Side Bend Limitations AROM    Other Lumbar Stretch Exercise glute stretch 1 min each      Lumbar Exercises: Seated   Other Seated Lumbar Exercises Forward and lateral flexion stretch with swiss ball 2 min x 5 sec each direction      Lumbar Exercises: Supine   Ab Set 10 reps;5 seconds    AB Set Limitations hands reaching for kness    Clam 20 reps    Clam Limitations green band    Bridge with clamshell 20 reps    Bridge with Ball Squeeze Limitations green, only able to glute set    Large Ball Abdominal Isometric 15 reps    Large Ball Abdominal Isometric Limitations 5 sec , mod cues    Large Ball Oblique Isometric 10 reps;5 seconds    Other Supine Lumbar Exercises hip adduction isometric 2 x 10;5 sec hold                    PT   Short Term Goals - 01/30/21 0727      PT SHORT TERM GOAL #1   Title Patient will be independent with initial HEP.    Baseline doing stretches several times per week.    Time 4    Period Weeks    Status Partially Met    Target Date 01/07/21      PT SHORT TERM GOAL #2   Title Patient will be able to perform lumbar AROM in all planes with </= 3/10 L LBP.    Baseline left rotation and return from flexion caused increased pain 5/10, others no increased    Time 4    Period Weeks    Status On-going    Target Date 01/07/21      PT SHORT TERM GOAL #3   Title Patient's FOTO score will improve from 50% function to at least 55% function to demonstrate improvement in perceived ability.    Baseline 50% function; predicted 63% function. 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    Baseline Pt reports she works but is not performing twisting and lifting, and her daughter does all household chores.    Time 4     Period Weeks    Status On-going    Target Date 01/07/21             PT Long Term Goals - 12/10/20 1528      PT LONG TERM GOAL #1   Title Patient will be independent with advanced HEP.    Baseline Intiial HEP will be issued at patient's first follow up visit after evaluation.    Time 8    Period Weeks    Status New    Target Date 02/04/21      PT LONG TERM GOAL #2   Title Patient will be able to perform lumbar AROM WFL in all planes with </= 1/10 L LBP.    Baseline 5/10 at rest with slightly increased pain during AROM into FL, EXT, L SB, and L ROT    Time 8    Period Weeks    Status New    Target Date 02/04/21      PT LONG TERM GOAL #3   Title Patient's FOTO score will improve from 50% function to at least 63% function to demonstrate improvement in perceived ability.    Baseline 50% function; predicted 63% function    Time 8    Period Weeks    Status New    Target Date 02/04/21      PT LONG TERM GOAL #4   Title Patient will be able to perform household and work activities without L LBP.    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 She also reports that her intake was answered wrong in regard to walking- should have scored herself worse. Rechecked STGs and   patient continues with increased pain during lumbar AROM and is not performing any work or household duties other than light duty and her daughter performs all household duties for her. She has not met any STGs. She has been stretching. Established and added core and hip strengthening for HEP today. Reviewed hip hinge and pt is reluctant to participate, reports she does not like to bend or squat due to her left knee. Education provided on hip hinge with limited  knee flexion to allow bending for work and household bending without lumbar strain. Most of session spent with hooklying core activation  and hip strength.    PT Next Visit Plan Consider discharge due to lack of progress, check new HEP    PT Home Exercise Plan Access Code: [email or web address]           Patient will benefit from skilled therapeutic intervention in order to improve the following deficits and impairments:  Decreased endurance,Decreased strength,Decreased mobility,Decreased range of motion,Difficulty walking,Decreased activity tolerance,Increased muscle spasms,Pain,Obesity  Visit Diagnosis: Chronic left-sided low back pain, unspecified whether sciatica present  Muscle weakness (generalized)  Difficulty in walking, not elsewhere classified  Muscle spasm of back     Problem List Patient Active Problem List   Diagnosis Date Noted  . Puncture wound of right foot 02/08/2020  . Sinusitis 10/31/2019  . Urine finding 10/31/2019  . Microalbuminuria 05/27/2018  . Hyperlipidemia 05/24/2017  . Inflammatory disorder of digestive tract 05/24/2017  . Low back pain 05/24/2017  . Type 2 diabetes mellitus with neurologic complication, without long-term current use of insulin ([facility]) 05/24/2017  . Vitamin D deficiency 05/24/2017  . Otalgia of both ears 06/09/2016  . Temporomandibular joint (TMJ) pain 06/09/2016  . 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 Obesity 03/21/2015    Donoho, Jessica McGee, PTA 01/30/2021, 9:11 AM  [facility] [address] [place], [place], [address] Phone: [phone]   Fax:  [phone]  Name: Erica [name] MRN: [id number] Date of Birth: 05/06/1971   

## 2021-02-03 ENCOUNTER — Ambulatory Visit: Payer: BC Managed Care – PPO

## 2021-02-03 ENCOUNTER — Other Ambulatory Visit: Payer: Self-pay

## 2021-02-03 DIAGNOSIS — M6281 Muscle weakness (generalized): Secondary | ICD-10-CM

## 2021-02-03 DIAGNOSIS — M545 Low back pain, unspecified: Secondary | ICD-10-CM

## 2021-02-03 DIAGNOSIS — M62830 Muscle spasm of back: Secondary | ICD-10-CM

## 2021-02-03 DIAGNOSIS — R262 Difficulty in walking, not elsewhere classified: Secondary | ICD-10-CM

## 2021-02-03 NOTE — Therapy (Signed)
[place], [place], [address] Phone: [phone]   Fax:  [phone]  Physical Therapy Treatment/Discharge Summary  Patient Details  Name: Erica [name] MRN: [id number] Date of Birth: 25-Jul-1971 Referring Provider (PT): Janith Lima, [place]   Encounter Date: 02/03/2021   PT End of Session - 02/03/21 0754    Visit Number 9    Number of Visits 17    Date for PT Re-Evaluation 02/08/21    Authorization Type BCBS - FOTO at visit 10    PT Start Time 0755   pt arrived late   PT Stop Time 0830    PT Time Calculation (min) 35 min    Activity Tolerance Patient tolerated treatment well    Behavior During Therapy [facility] for tasks assessed/performed           Past Medical History:  Diagnosis Date  . Arthritis   . Depression   . Diabetes mellitus   . Fatty liver   . Hernia, abdominal   . Hypercholesteremia   . IBS (irritable bowel syndrome)     Past Surgical History:  Procedure Laterality Date  . CESAREAN SECTION    . CHOLECYSTECTOMY      There were no vitals filed for this visit.   Subjective Assessment - 02/03/21 0756    Subjective "I feel okay. Last week was really bad for me. I just haven't been well over all. I'm feeling better today." She reports noticing that her sleeping position may be contributing to her back pain as well as hanging her feet off the bed helps with her LE symptoms typically but results in increased back pain when she wakes up.    Pertinent History See extensive PMH above    Limitations Sitting;Standing;Walking;House hold activities;Lifting    How long can you sit comfortably? "It's just constant"    How long can you stand comfortably? "It's just constant"    How long can you walk comfortably? "It's just constant"    Patient Stated Goals "I would love my energy to be better eventually"    Currently in Pain? No/denies    Pain Score 0-No pain    Pain Onset More than a  month ago              [facility] PT Assessment - 02/03/21 0001      Assessment   Medical Diagnosis L low back pain (lumbar)    Referring Provider (PT) Wieters, Hallie C, PA-C      AROM   Lumbar Flexion 2 inches from toes - "I feel it" when coming upregarding L LBP    Lumbar Extension WFL but pain in L LBP    Lumbar - Right Side Bend WFL - to lateral knee joint line "it's a little uncomfortable"    Lumbar - Left Side Bend to lateral knee joint line with decreased ROM compared to R SB    Lumbar - Right Rotation WFL with L LBP    Lumbar - Left Rotation WFL      Strength   Overall Strength Comments 5/5 BLE MMT grossly                         [facility] Adult PT Treatment/Exercise - 02/03/21 0001      Self-Care   Self-Care Other Self-Care Comments    Other Self-Care Comments  See patient education      Lumbar Exercises: Stretches   Single Knee to Chest  Stretch Right;Left;1 rep;60 seconds    Lower Trunk Rotation Limitations 2 min    Standing Side Bend Right;2 reps;30 seconds    Standing Side Bend Limitations L QL stretch in doorway      Lumbar Exercises: Standing   Other Standing Lumbar Exercises Mini squats with tap to armrest of chair - cues for technique x 20    Other Standing Lumbar Exercises Hip hinge with dowel x 15 with cues for technique      Lumbar Exercises: Supine   Clam 15 reps   2 x 15   Clam Limitations blue band                  PT Education - 02/03/21 0838    Education Details Reviewed body mechanics (demonstration and pt returned demonstration) while bending and lifting, reviewed final HEP, addressed pt questions, objective findings. Discussed sleeping positions and trying to hand LE off bed for limited time if it provides relief but bringing them onto bed before falling asleep to avoid increased lumbar strain.    Person(s) Educated Patient    Methods Explanation;Demonstration;Tactile cues;Verbal cues;Handout    Comprehension Verbalized  understanding;Returned demonstration            PT Short Term Goals - 02/03/21 0840      PT SHORT TERM GOAL #1   Title Patient will be independent with initial HEP.    Baseline doing stretches several times per week.    Time 4    Period Weeks    Status Partially Met    Target Date 01/07/21      PT SHORT TERM GOAL #2   Title Patient will be able to perform lumbar AROM in all planes with </= 3/10 L LBP.    Baseline left rotation and return from flexion caused increased pain 5/10, others no increased - continued pain in L LBP that was minimal in most directions but present (see flowsheet)    Time 4    Period Weeks    Status Not Met    Target Date 01/07/21      PT SHORT TERM GOAL #3   Title Patient's FOTO score will improve from 50% function to at least 55% function to demonstrate improvement in perceived ability.    Baseline 50% function; predicted 63% function. 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    Baseline Continued pain and difficulty with activities - daughter assists with household activities    Time 4    Period Weeks    Status Not Met    Target Date 01/07/21             PT Long Term Goals - 02/03/21 9983      PT LONG TERM GOAL #1   Title Patient will be independent with advanced HEP.    Baseline Intiial HEP will be issued at patient's first follow up visit after evaluation.    Time 8    Period Weeks    Status Partially Met      PT LONG TERM GOAL #2   Title Patient will be able to perform lumbar AROM WFL in all planes with </= 1/10  L LBP.    Baseline See flowsheet - persistent pain with AROM    Time 8    Period Weeks    Status Not Met      PT LONG TERM GOAL #3   Title Patient's FOTO score will  improve from 50% function to at least 63% function to demonstrate improvement in perceived ability.    Baseline 50% function; predicted 63% function - intake not accurate per pt    Time 8    Period Weeks    Status Deferred      PT LONG TERM GOAL #4   Title Patient will be able to perform household and work activities without L LBP.    Baseline Performs tasks but continues to have difficulty/pain    Time 8    Period Weeks    Status Not Met                 Plan - 02/03/21 0807    Clinical Impression Statement Patient had good tolerance with interventions but reports continued LBP and L knee pain without significant relief. Pt reiterates that she has L knee pain when bending and squatting but was able to perform mini squats tapping to armrest of chair and hip hinge with dowel with cues for technique and advised to incorporate these body mechanics when performing tasks at home. Reviewed HEP and advised that patient follow up with her doctor to determine course of action to address continued pain and emotional aspect that has been limiting patient most days.    Personal Factors and Comorbidities Comorbidity 3+;Past/Current Experience;Time since onset of injury/illness/exacerbation;Profession;Fitness    Comorbidities See extensive PMH above    Examination-Activity Limitations Bathing;Bed Mobility;Bend;Caring for Others;Carry;Lift;Locomotion Level;Squat;Stand;Stairs;Sit    Examination-Participation Restrictions Cleaning;Community Activity;Occupation;Shop    PT Treatment/Interventions ADLs/Self Care Home Management;Neuromuscular re-education;Balance training;Therapeutic exercise;Therapeutic activities;Functional mobility training;Stair training;Gait training;Iontophoresis 34m/ml Dexamethasone;Aquatic Therapy;Cryotherapy;Electrical Stimulation;Moist Heat;Traction;Ultrasound;Manual techniques;Patient/family education;Taping;Passive range of motion;Dry needling    PT Next Visit Plan D/C today     PT Home Exercise Plan Access Code: [email or web address]   Consulted and Agree with Plan of Care Patient           Patient will benefit from skilled therapeutic intervention in order to improve the following deficits and impairments:  Decreased endurance,Decreased strength,Decreased mobility,Decreased range of motion,Difficulty walking,Decreased activity tolerance,Increased muscle spasms,Pain,Obesity  Visit Diagnosis: Chronic left-sided low back pain, unspecified whether sciatica present  Muscle weakness (generalized)  Difficulty in walking, not elsewhere classified  Muscle spasm of back     Problem List Patient Active Problem List   Diagnosis Date Noted  . Puncture wound of right foot 02/08/2020  . Sinusitis 10/31/2019  . Urine finding 10/31/2019  . Microalbuminuria 05/27/2018  . Hyperlipidemia 05/24/2017  . Inflammatory disorder of digestive tract 05/24/2017  . Low back pain 05/24/2017  . Type 2 diabetes mellitus with neurologic complication, without long-term current use of insulin ([place] 05/24/2017  . Vitamin D deficiency 05/24/2017  . Otalgia of both ears 06/09/2016  . Temporomandibular joint (TMJ) pain 06/09/2016  . 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 Obesity 03/21/2015     PHYSICAL THERAPY DISCHARGE SUMMARY  Visits from Start of Care: 9  Current functional level related to goals / functional outcomes: See above   Remaining deficits: See above   Education / Equipment: See above  Plan: Patient agrees to discharge.  Patient goals were partially met. Patient is being discharged due to lack of progress.  ?????     Patient's pain fluctuates but  has remained fairly consistent overall with heavy contribution from her emotional well-being and level of motivation due to several personal factors.   Haydee Monica, PT, DPT 02/03/21 8:46 AM  [facility] [address] [place], [place], [address] Phone: [phone]   Fax:   [phone]  Name: Erica [name] MRN: [id number] Date of Birth: 25-Sep-1971

## 2021-02-06 ENCOUNTER — Encounter: Payer: Self-pay | Admitting: Obstetrics

## 2021-02-06 ENCOUNTER — Telehealth (INDEPENDENT_AMBULATORY_CARE_PROVIDER_SITE_OTHER): Payer: BC Managed Care – PPO | Admitting: Obstetrics

## 2021-02-06 DIAGNOSIS — Z1239 Encounter for other screening for malignant neoplasm of breast: Secondary | ICD-10-CM

## 2021-02-06 DIAGNOSIS — R9389 Abnormal findings on diagnostic imaging of other specified body structures: Secondary | ICD-10-CM | POA: Diagnosis not present

## 2021-02-06 MED ORDER — MEGESTROL ACETATE 40 MG PO TABS: 40.0000 mg | ORAL_TABLET | Freq: Two times a day (BID) | ORAL | 1 refills | Status: DC

## 2021-02-06 NOTE — Progress Notes (Signed)
GYNECOLOGY VIRTUAL VISIT ENCOUNTER NOTE  Provider location: [facility] for [facility] [place]   I connected with Erica [name] on 02/06/21 at  4:15 PM EST by MyChart Video Encounter at home and verified that I am speaking with the correct person using two identifiers.   I discussed the limitations, risks, security and privacy concerns of performing an evaluation and management service virtually and the availability of in person appointments. I also discussed with the patient that there may be a patient responsible charge related to this service. The patient expressed understanding and agreed to proceed.   History:  Erica [name] is a 50 y.o. G1P0 female being evaluated today for thickened endometrium on ultrasound.  Endometrial biopsy was normal proliferative endometrium.  Has been on Megace for 1 month, and ultrasound was repeated.  She presents virtually for results of the follow up ultrasound after Megace 40 mg daily x 1 month.  She is currently having vaginal spotting. She denies any abnormal vaginal discharge, pelvic pain or other concerns.       Past Medical History:  Diagnosis Date  . Arthritis   . Depression   . Diabetes mellitus   . Fatty liver   . Hernia, abdominal   . Hypercholesteremia   . IBS (irritable bowel syndrome)    Past Surgical History:  Procedure Laterality Date  . CESAREAN SECTION    . CHOLECYSTECTOMY     The following portions of the patient's history were reviewed and updated as appropriate: allergies, current medications, past family history, past medical history, past social history, past surgical history and problem list.   Health Maintenance:  Normal pap and negative HRHPV on 03-06-2020.  Normal mammogram in 2016.   Review of Systems:  Pertinent items noted in HPI and remainder of comprehensive ROS otherwise negative.  Physical Exam:   General:  Alert, oriented and cooperative. Patient appears to be in no acute distress.   Mental Status: Normal mood and affect. Normal behavior. Normal judgment and thought content.   Respiratory: Normal respiratory effort, no problems with respiration noted  Rest of physical exam deferred due to type of encounter  Labs and Imaging Results for orders placed or performed in visit on 01/23/21 (from the past 336 hour(s))  POCT urine pregnancy   Collection Time: 01/23/21  4:39 PM  Result Value Ref Range   Preg Test, Ur Negative Negative  Surgical pathology( [place]/ [facility])   Collection Time: 01/23/21  4:39 PM  Result Value Ref Range   SURGICAL PATHOLOGY      SURGICAL PATHOLOGY CASE: [id number] PATIENT: [facility] Surgical Pathology Report     Clinical History: endometrial thickening on ultrasound (cm)     FINAL MICROSCOPIC DIAGNOSIS:  A. ENDOMETRIUM, BIOPSY: - Inactive endometrium with progestational changes. - No hyperplasia or carcinoma.   GROSS DESCRIPTION:  Received in formalin are tan, hemorrhagic soft tissue fragments that are entirely submitted. Volume: 2 x 2 x 0.3 cm. (1 B) (GRP 01/24/2021)    Final Diagnosis performed by Jimmy Picket, MD.   Electronically signed 01/27/2021 Technical component performed at [employer]. [facility], [address], [place], [place] [address].  Professional component performed at [facility] [address] [place], [place] [address].  Immunohistochemistry Technical component (if applicable) was performed at [facility]. [address], [address], [place], [place] [address].   IMMUNOHISTOCHEMISTRY DISCLAIMER ( if applicable): Some of these immunohistochemical stains may have been developed and the performance characteristics determine by  [facility]. Some may not have been cleared or approved by the U.S. Food and Drug Administration. The FDA has determined that such clearance or approval is not necessary. This test is used for clinical  purposes. It should not be regarded as investigational or for research. This laboratory is certified under the Clinical Laboratory Improvement Amendments of 1988 (CLIA-88) as qualified to perform high complexity clinical laboratory testing.  The controls stained appropriately.    US PELVIC COMPLETE WITH TRANSVAGINAL  Result Date: 01/21/2021 CLINICAL DATA:  Follow-up examination for endometrial thickening. EXAM: TRANSABDOMINAL AND TRANSVAGINAL ULTRASOUND OF PELVIS TECHNIQUE: Both transabdominal and transvaginal ultrasound examinations of the pelvis were performed. Transabdominal technique was performed for global imaging of the pelvis including uterus, ovaries, adnexal regions, and pelvic cul-de-sac. It was necessary to proceed with endovaginal exam following the transabdominal exam to visualize the uterus, endometrium, and ovaries. COMPARISON:  Previous ultrasound from 08/30/2020. FINDINGS: Uterus Measurements: 10.9 x 6.5 x 7.0 cm = volume: 256.1 mL. Uterus is anteverted and somewhat enlarged with globular morphology. No discrete fibroid or other mass. Endometrium Thickness: 8.7 mm. Ill definition and poorly visualized endometrial-myometrial interface, which could reflect underlying adenomyosis. No other focal abnormality. Right ovary Not visualized.  No adnexal mass. Left ovary Measurements: 3.1 x 1.7 x 1.3 cm = volume: 3.6 mL. Normal appearance/no adnexal mass. Other findings No abnormal free fluid. IMPRESSION: 1. Endometrial stripe measures within normal limits at 8.7 mm in thickness on today's exam. 2. Enlarged globular uterus with poor endometrial-myometrial interface, suggesting possible adenomyosis. No discrete fibroid or other uterine mass. 3. Normal sonographic appearance of the left ovary, with nonvisualization of the right ovary. No adnexal mass or free fluid within the pelvis. Electronically Signed   By: Rise Mu M.D.   On: 01/21/2021 17:10       Assessment and Plan:     1.  Endometrial thickening on ultrasound - continue Megace for 1 month more - may benefit from another Mirena IUD ( the previous IUD was malpositioned and had to be removed ) Rx: - megestrol (MEGACE) 40 MG tablet; Take 1 tablet (40 mg total) by mouth 2 (two) times daily.  Dispense: 60 tablet; Refill: 1  2. Mammogram ordered ( last mammogram in 2016 )       I discussed the assessment and treatment plan with the patient. The patient was provided an opportunity to ask questions and all were answered. The patient agreed with the plan and demonstrated an understanding of the instructions.   The patient was advised to call back or seek an in-person evaluation/go to the ED if the symptoms worsen or if the condition fails to improve as anticipated.  I provided 15 minutes of face-to-face time during this encounter.   Coral Ceo, MD Center for [facility], [facility] 02/06/21

## 2021-02-06 NOTE — Progress Notes (Signed)
[facility] FU for [place] results.

## 2021-03-19 LAB — HM COLONOSCOPY

## 2021-03-20 ENCOUNTER — Telehealth (INDEPENDENT_AMBULATORY_CARE_PROVIDER_SITE_OTHER): Payer: BC Managed Care – PPO | Admitting: Obstetrics

## 2021-03-20 DIAGNOSIS — R9389 Abnormal findings on diagnostic imaging of other specified body structures: Secondary | ICD-10-CM

## 2021-03-21 NOTE — Progress Notes (Signed)
Appointment cancelled

## 2021-04-06 ENCOUNTER — Emergency Department (HOSPITAL_BASED_OUTPATIENT_CLINIC_OR_DEPARTMENT_OTHER)
Admission: EM | Admit: 2021-04-06 | Discharge: 2021-04-06 | Disposition: A | Discharge status: Home / Self Care | Payer: BC Managed Care – PPO | Attending: Emergency Medicine | Admitting: Emergency Medicine

## 2021-04-06 ENCOUNTER — Encounter (HOSPITAL_BASED_OUTPATIENT_CLINIC_OR_DEPARTMENT_OTHER): Payer: Self-pay

## 2021-04-06 ENCOUNTER — Other Ambulatory Visit: Payer: Self-pay

## 2021-04-06 DIAGNOSIS — S0990XA Unspecified injury of head, initial encounter: Secondary | ICD-10-CM | POA: Diagnosis present

## 2021-04-06 DIAGNOSIS — Z7982 Long term (current) use of aspirin: Secondary | ICD-10-CM | POA: Diagnosis not present

## 2021-04-06 DIAGNOSIS — Z7984 Long term (current) use of oral hypoglycemic drugs: Secondary | ICD-10-CM | POA: Insufficient documentation

## 2021-04-06 DIAGNOSIS — E785 Hyperlipidemia, unspecified: Secondary | ICD-10-CM | POA: Diagnosis not present

## 2021-04-06 DIAGNOSIS — Y9389 Activity, other specified: Secondary | ICD-10-CM | POA: Diagnosis not present

## 2021-04-06 DIAGNOSIS — E1169 Type 2 diabetes mellitus with other specified complication: Secondary | ICD-10-CM | POA: Insufficient documentation

## 2021-04-06 DIAGNOSIS — Y92000 Kitchen of unspecified non-institutional (private) residence as  the place of occurrence of the external cause: Secondary | ICD-10-CM | POA: Insufficient documentation

## 2021-04-06 DIAGNOSIS — W08XXXA Fall from other furniture, initial encounter: Secondary | ICD-10-CM | POA: Insufficient documentation

## 2021-04-06 DIAGNOSIS — W19XXXA Unspecified fall, initial encounter: Secondary | ICD-10-CM

## 2021-04-06 DIAGNOSIS — S0003XA Contusion of scalp, initial encounter: Secondary | ICD-10-CM | POA: Insufficient documentation

## 2021-04-06 DIAGNOSIS — Z79899 Other long term (current) drug therapy: Secondary | ICD-10-CM | POA: Diagnosis not present

## 2021-04-06 NOTE — ED Provider Notes (Signed)
[facility] EMERGENCY DEPT Provider Note   CSN: [id number] Arrival date & time: 04/06/21  1045     History Chief Complaint  Patient presents with  . Fall    Erica [name] is a 50 y.o. female.  Patient is a 50 year old female with a history of diabetes and high cholesterol who does not take any anticoagulation presenting today after a fall last night where she hit her head.  Patient was decorating in her kitchen and was standing on a stepstool.  She was leaning over to lift something heavy when the stepstool started to slide out beneath her.  The heavy decoration she was holding fell on her head and then as she was falling she hit her forehead on the counter.  She did feel dazed for short period of time but denies any loss of consciousness.  Since that time she has had pain in the scalp where the object hit her in the head and where she hit her left upper forehead.  She has had no vision changes, headaches, nausea, vomiting.  She has had no difficulty walking, unilateral weakness or numbness.  No difficulty with thought processes.  This occurred at 9 PM last night and we are now 18 hours since the event and patient reports she feels fine but her family urged her to be evaluated.  The history is provided by the patient.  Fall This is a new problem. The current episode started 12 to 24 hours ago. The problem occurs constantly. The problem has not changed since onset.Associated symptoms comments: Scalp pain. Nothing aggravates the symptoms. Nothing relieves the symptoms. The treatment provided significant relief.       Past Medical History:  Diagnosis Date  . Arthritis   . Depression   . Diabetes mellitus   . Fatty liver   . Hernia, abdominal   . Hypercholesteremia   . IBS (irritable bowel syndrome)     Patient Active Problem List   Diagnosis Date Noted  . Puncture wound of right foot 02/08/2020  . Sinusitis 10/31/2019  . Urine finding 10/31/2019  .  Microalbuminuria 05/27/2018  . Hyperlipidemia 05/24/2017  . Inflammatory disorder of digestive tract 05/24/2017  . Low back pain 05/24/2017  . Type 2 diabetes mellitus with neurologic complication, without long-term current use of insulin (HCC) 05/24/2017  . Vitamin D deficiency 05/24/2017  . Otalgia of both ears 06/09/2016  . Temporomandibular joint (TMJ) pain 06/09/2016  . 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 Obesity 03/21/2015    Past Surgical History:  Procedure Laterality Date  . CESAREAN SECTION    . CHOLECYSTECTOMY       OB History    Gravida  1   Para      Term      Preterm      AB      Living  1     SAB      IAB      Ectopic      Multiple      Live Births  1           Family History  Problem Relation Age of Onset  . Hypertension Mother   . Heart failure Mother   . Fibroids Mother   . Fibroids Sister   . Fibroids Maternal Grandmother   . Diabetes type II Paternal Grandmother     Social History   Tobacco Use  . Smoking status: Never Smoker  . Smokeless tobacco: Never Used  Vaping Use  . Vaping  Use: Never used  Substance Use Topics  . Alcohol use: No  . Drug use: No    Home Medications Prior to Admission medications   Medication Sig Start Date End Date Taking? Authorizing Provider  aspirin 81 MG tablet Take 81 mg by mouth at bedtime.     [provider]  atorvastatin (LIPITOR) 20 MG tablet Take 20 mg by mouth at bedtime.     [provider]  busPIRone (BUSPAR) 7.5 MG tablet buspirone 7.5 mg tablet 10/23/19   [provider]  escitalopram (LEXAPRO) 10 MG tablet Take by mouth. 09/05/19   [provider]  HYDROcodone-acetaminophen (NORCO/VICODIN) 5-325 MG tablet Take 1-2 tablets by mouth every 6 (six) hours as needed for severe pain. 11/10/20   Wieters, Hallie C, PA-C  liraglutide (VICTOZA) 18 MG/3ML SOPN Inject into the skin. 09/05/19   [provider]  lisinopril (PRINIVIL,ZESTRIL) 5 MG  tablet Take by mouth. 05/27/18   [provider]  megestrol (MEGACE) 40 MG tablet Take 1 tablet (40 mg total) by mouth 2 (two) times daily. 02/06/21   Brock Bad, MD  metFORMIN (GLUCOPHAGE) 1000 MG tablet Take by mouth. Patient not taking: Reported on 12/10/2020 05/02/20 05/02/21  [provider]  naproxen (NAPROSYN) 500 MG tablet Take 1 tablet (500 mg total) by mouth 2 (two) times daily. 11/10/20   Wieters, Hallie C, PA-C  ondansetron (ZOFRAN) 8 MG tablet SMARTSIG:1 Tablet(s) By Mouth Every 12 Hours PRN 09/12/19   [provider]  pregabalin (LYRICA) 75 MG capsule Take 75 mg by mouth 2 (two) times daily. 02/27/20   [provider]  sitaGLIPtin-metformin (JANUMET) 50-1000 MG tablet Take 1 tablet by mouth 2 (two) times daily with a meal.    [provider]  traZODone (DESYREL) 100 MG tablet Take 100 mg by mouth at bedtime.    [provider]  Vitamin D, Ergocalciferol, (DRISDOL) 50000 UNITS CAPS capsule Take 50,000 Units by mouth every 7 (seven) days.    [provider]  amitriptyline (ELAVIL) 50 MG tablet amitriptyline 50 mg tablet  Take 1 tablet(s) every day by oral route as directed for 30 days.  11/10/20  [provider]  buPROPion (WELLBUTRIN XL) 150 MG 24 hr tablet bupropion HCl XL 150 mg 24 hr tablet, extended release  Take one tablet (150 mg total) by mouth every morning.  11/10/20  [provider]  gabapentin (NEURONTIN) 300 MG capsule Take 300 mg by mouth.  11/10/20  [provider]    Allergies    Penicillins  Review of Systems   Review of Systems  All other systems reviewed and are negative.   Physical Exam Updated Vital Signs BP (!) 154/91 (BP Location: Right Arm)   Pulse 99   Temp 98.3 F (36.8 C) (Oral)   Resp 16   LMP  (LMP Unknown)   SpO2 100%   Physical Exam Vitals and nursing note reviewed.  Constitutional:      General: She is not in acute distress.    Appearance: She  is well-developed.  HENT:     Head: Normocephalic and atraumatic.      Comments: 2 areas of contusion with mild ecchymosis and tenderness with palpation    Right Ear: Tympanic membrane normal.     Left Ear: Tympanic membrane normal.  Eyes:     Extraocular Movements: Extraocular movements intact.     Conjunctiva/sclera: Conjunctivae normal.     Pupils: Pupils are equal, round, and reactive to light.  Cardiovascular:     Rate and Rhythm: Normal rate and regular rhythm.     Heart sounds: Normal heart sounds. No murmur heard. No friction rub.  Pulmonary:     Effort: Pulmonary effort is normal.     Breath sounds: Normal breath sounds. No wheezing or rales.  Abdominal:     General: Bowel sounds are normal. There is no distension.     Palpations: Abdomen is soft.     Tenderness: There is no abdominal tenderness. There is no guarding or rebound.  Musculoskeletal:        General: No tenderness. Normal range of motion.     Comments: No edema  Skin:    General: Skin is warm and dry.     Findings: No rash.  Neurological:     General: No focal deficit present.     Mental Status: She is alert and oriented to person, place, and time. Mental status is at baseline.     Cranial Nerves: No cranial nerve deficit.     Sensory: No sensory deficit.     Motor: No weakness.     Gait: Gait normal.  Psychiatric:        Mood and Affect: Mood normal.        Behavior: Behavior normal.     ED Results / Procedures / Treatments   Labs (all labs ordered are listed, but only abnormal results are displayed) Labs Reviewed - No data to display  EKG None  Radiology No results found.  Procedures Procedures   Medications Ordered in ED Medications - No data to display  ED Course  I have reviewed the triage vital signs and the nursing notes.  Pertinent labs & imaging results that were available during my care of the patient were reviewed by me and considered in my medical decision making (see chart  for details).    MDM Rules/Calculators/A&P                          Patient presenting today with head injury that occurred last night.  Patient had no loss of consciousness.  It is now 18 hours after the injury and patient only has tenderness with palpation to the scalp.  She has no headaches nausea or vomiting.  Her neurologic exam is normal.  She does not take anticoagulation.  Do not feel that patient needs any further imaging at this time.  She was given return precautions but at this time she appears stable for discharge.  Final Clinical Impression(s) / ED Diagnoses Final diagnoses:  Fall, initial encounter  Contusion of scalp, initial encounter    Rx / DC Orders ED Discharge Orders    None       Gwyneth Sprout, MD 04/06/21 1148

## 2021-04-06 NOTE — Discharge Instructions (Signed)
Everything looks normal today.  You can use Tylenol as needed for pain in the scalp from the fall.  No evidence of bleeding in your brain and no need for further imaging at this time.

## 2021-04-06 NOTE — ED Triage Notes (Signed)
She reports that she fell yesterday while up on a step-ladder reaching over a cabinet to obtain a "cement cross". While performing this activity, she states the ladder began to slip backward and she dropped the cross, which struck her on the head, and her head also struck the counter top. She denies l.o.c. and is in no distress.

## 2021-04-11 ENCOUNTER — Other Ambulatory Visit: Payer: Self-pay

## 2021-04-11 ENCOUNTER — Ambulatory Visit: Payer: BC Managed Care – PPO | Admitting: Podiatry

## 2021-04-11 DIAGNOSIS — S90212A Contusion of left great toe with damage to nail, initial encounter: Secondary | ICD-10-CM | POA: Diagnosis not present

## 2021-04-11 DIAGNOSIS — L603 Nail dystrophy: Secondary | ICD-10-CM

## 2021-04-15 ENCOUNTER — Encounter: Payer: Self-pay | Admitting: Podiatry

## 2021-04-15 NOTE — Progress Notes (Signed)
Subjective:  Patient ID: Erica [name], female    DOB: 04-11-1971,  MRN: [id number]  Chief Complaint  Patient presents with  . Nail Problem    Left hallux nail is trying to come off     50 y.o. female presents with the above complaint.  Patient presents with complaint of left hallux nail contusion.  Patient states that it is hurting her and causing her some pain.  Patient states that the nail started falling off out of nowhere.  She does not recall injuring it however it is hanging on by a thread and is causing her pain.  She would like to have removed.  She has not seen anyone else prior to see me.  She is not does not have any infection.  She is a diabetic and is concerned for ulceration.  She does not recall her last A1c.   Review of Systems: Negative except as noted in the HPI. Denies N/V/F/Ch.  Past Medical History:  Diagnosis Date  . Arthritis   . Depression   . Diabetes mellitus   . Fatty liver   . Hernia, abdominal   . Hypercholesteremia   . IBS (irritable bowel syndrome)     Current Outpatient Medications:  .  aspirin 81 MG tablet, Take 81 mg by mouth at bedtime. , Disp: , Rfl:  .  atorvastatin (LIPITOR) 20 MG tablet, Take 20 mg by mouth at bedtime. , Disp: , Rfl:  .  busPIRone (BUSPAR) 7.5 MG tablet, buspirone 7.5 mg tablet, Disp: , Rfl:  .  escitalopram (LEXAPRO) 10 MG tablet, Take by mouth., Disp: , Rfl:  .  HYDROcodone-acetaminophen (NORCO/VICODIN) 5-325 MG tablet, Take 1-2 tablets by mouth every 6 (six) hours as needed for severe pain., Disp: 8 tablet, Rfl: 0 .  liraglutide (VICTOZA) 18 MG/3ML SOPN, Inject into the skin., Disp: , Rfl:  .  lisinopril (PRINIVIL,ZESTRIL) 5 MG tablet, Take by mouth., Disp: , Rfl:  .  megestrol (MEGACE) 40 MG tablet, Take 1 tablet (40 mg total) by mouth 2 (two) times daily., Disp: 60 tablet, Rfl: 1 .  metFORMIN (GLUCOPHAGE) 1000 MG tablet, Take by mouth. (Patient not taking: Reported on 12/10/2020), Disp: , Rfl:  .  naproxen  (NAPROSYN) 500 MG tablet, Take 1 tablet (500 mg total) by mouth 2 (two) times daily., Disp: 30 tablet, Rfl: 0 .  ondansetron (ZOFRAN) 8 MG tablet, SMARTSIG:1 Tablet(s) By Mouth Every 12 Hours PRN, Disp: , Rfl:  .  pregabalin (LYRICA) 75 MG capsule, Take 75 mg by mouth 2 (two) times daily., Disp: , Rfl:  .  sitaGLIPtin-metformin (JANUMET) 50-1000 MG tablet, Take 1 tablet by mouth 2 (two) times daily with a meal., Disp: , Rfl:  .  traZODone (DESYREL) 100 MG tablet, Take 100 mg by mouth at bedtime., Disp: , Rfl:  .  Vitamin D, Ergocalciferol, (DRISDOL) 50000 UNITS CAPS capsule, Take 50,000 Units by mouth every 7 (seven) days., Disp: , Rfl:   Social History   Tobacco Use  Smoking Status Never Smoker  Smokeless Tobacco Never Used    Allergies  Allergen Reactions  . Penicillins     Reported from childhood   Objective:  There were no vitals filed for this visit. There is no height or weight on file to calculate BMI. Constitutional Well developed. Well nourished.  Vascular Dorsalis pedis pulses palpable bilaterally. Posterior tibial pulses palpable bilaterally. Capillary refill normal to all digits.  No cyanosis or clubbing noted. Pedal hair growth normal.  Neurologic Normal speech. Oriented to  person, place, and time. Epicritic sensation to light touch grossly present bilaterally.  Dermatologic Pain on palpation of the entire/total nail on 1st digit of the left No other open wounds. No skin lesions.  Orthopedic: Normal joint ROM without pain or crepitus bilaterally. No visible deformities. No bony tenderness.   Radiographs: None Assessment:   1. Contusion of left great toe with damage to nail, initial encounter   2. Nail dystrophy    Plan:  Patient was evaluated and treated and all questions answered.  Nail contusion/dystrophy hallux, left with underlying nail dystrophy -Patient elects to proceed with minor surgery to remove entire toenail today. Consent reviewed and signed  by patient. -Entire/total nail excised. See procedure note. -Educated on post-procedure care including soaking. Written instructions provided and reviewed. -Patient to follow up in 2 weeks for nail check. -I discussed with her given that she is diabetic she is at high risk of developing wound complication.  She would like to proceed despite the risks.  Nail dystrophy  Procedure: Excision of entire/total nail  Location: Left 1st toe digit Anesthesia: Lidocaine 1% plain; 1.5 mL and Marcaine 0.5% plain; 1.5 mL, digital block. Skin Prep: Betadine. Dressing: Silvadene; telfa; dry, sterile, compression dressing. Technique: Following skin prep, the toe was exsanguinated and a tourniquet was secured at the base of the toe. The affected nail border was freed and excised. The tourniquet was then removed and sterile dressing applied. Disposition: Patient tolerated procedure well. Patient to return in 2 weeks for follow-up.   No follow-ups on file.

## 2021-05-02 ENCOUNTER — Telehealth (INDEPENDENT_AMBULATORY_CARE_PROVIDER_SITE_OTHER): Payer: BC Managed Care – PPO | Admitting: Obstetrics

## 2021-05-02 ENCOUNTER — Encounter: Payer: Self-pay | Admitting: Obstetrics

## 2021-05-02 ENCOUNTER — Other Ambulatory Visit: Payer: Self-pay | Admitting: Obstetrics

## 2021-05-02 DIAGNOSIS — Z1239 Encounter for other screening for malignant neoplasm of breast: Secondary | ICD-10-CM

## 2021-05-02 DIAGNOSIS — R9389 Abnormal findings on diagnostic imaging of other specified body structures: Secondary | ICD-10-CM

## 2021-05-02 NOTE — Progress Notes (Signed)
    GYNECOLOGY VIRTUAL VISIT ENCOUNTER NOTE  Provider location: [facility] for [facility]   Patient location: Home  I connected with Erica [name] on 05/02/21 at  8:45 AM EDT by MyChart Video Encounter and verified that I am speaking with the correct person using two identifiers.   I discussed the limitations, risks, security and privacy concerns of performing an evaluation and management service virtually and the availability of in person appointments. I also discussed with the patient that there may be a patient responsible charge related to this service. The patient expressed understanding and agreed to proceed.   History:  Erica [name] is a 50 y.o. G1P0 female being evaluated today for follow up ultrasound results.  She has a history of endometrial thickening on ultrasound, and was treated with Megace. She denies any abnormal vaginal discharge, bleeding, pelvic pain or other concerns.       Past Medical History:  Diagnosis Date  . Arthritis   . Depression   . Diabetes mellitus   . Fatty liver   . Hernia, abdominal   . Hypercholesteremia   . IBS (irritable bowel syndrome)    Past Surgical History:  Procedure Laterality Date  . CESAREAN SECTION    . CHOLECYSTECTOMY     The following portions of the patient's history were reviewed and updated as appropriate: allergies, current medications, past family history, past medical history, past social history, past surgical history and problem list.   Health Maintenance:  Normal pap and negative HRHPV on 01-23-2021.  Normal mammogram on 01-21-2021  Review of Systems:  Pertinent items noted in HPI and remainder of comprehensive ROS otherwise negative.  Physical Exam:   General:  Alert, oriented and cooperative. Patient appears to be in no acute distress.  Mental Status: Normal mood and affect. Normal behavior. Normal judgment and thought content.   Respiratory: Normal respiratory effort, no problems  with respiration noted  Rest of physical exam deferred due to type of encounter  Labs and Imaging No results found for this or any previous visit (from the past 336 hour(s)). No results found.     Assessment and Plan:     1. Endometrial thickening on ultrasound, resolved on follow up ultrasound - follow up in 3 months     I discussed the assessment and treatment plan with the patient. The patient was provided an opportunity to ask questions and all were answered. The patient agreed with the plan and demonstrated an understanding of the instructions.   The patient was advised to call back or seek an in-person evaluation/go to the ED if the symptoms worsen or if the condition fails to improve as anticipated.  I have spent a total of 10 minutes of non-face-to-face time, excluding clinical staff time, reviewing notes and preparing to see patient, ordering tests and/or medications, and counseling the patient.   Coral Ceo, MD Center for [facility], [facility] 05/02/21

## 2021-05-02 NOTE — Progress Notes (Signed)
F/U virtual visit.

## 2021-05-13 ENCOUNTER — Other Ambulatory Visit: Payer: Self-pay

## 2021-05-13 ENCOUNTER — Encounter: Payer: Self-pay | Admitting: Podiatry

## 2021-05-13 ENCOUNTER — Ambulatory Visit (HOSPITAL_BASED_OUTPATIENT_CLINIC_OR_DEPARTMENT_OTHER): Admission: RE | Admit: 2021-05-13 | Payer: BC Managed Care – PPO | Source: Ambulatory Visit | Admitting: Radiology

## 2021-05-13 ENCOUNTER — Ambulatory Visit: Payer: BC Managed Care – PPO | Admitting: Podiatry

## 2021-05-13 DIAGNOSIS — L84 Corns and callosities: Secondary | ICD-10-CM | POA: Diagnosis not present

## 2021-05-13 DIAGNOSIS — E1142 Type 2 diabetes mellitus with diabetic polyneuropathy: Secondary | ICD-10-CM

## 2021-05-13 NOTE — Progress Notes (Signed)
  Subjective:  Patient ID: Erica [name], female    DOB: June 01, 1971,  MRN: [id number]  50 y.o. female presents with at risk foot care with history of diabetic neuropathy and callus(es) b/l feet. Aggravating factors include weightbearing with and without shoe gear. Pain is relieved with periodic professional debridement.   She states Dr. Allena Katz removed her left great toenail due to trauma. She notes no problems with digit.  Erica [name] relates she lost her husband in October. She is still dealing with this loss. Her mother and daughter now live with her.  Patient does not monitor blood glucose on a daily basis.  PCP: Primus Bravo, NP and last visit was: 03/03/2021.  Review of Systems: Negative except as noted in the HPI.   Allergies  Allergen Reactions  . Penicillins     Reported from childhood    Objective:  There were no vitals filed for this visit. Constitutional Patient is a pleasant 50 y.o. African American female morbidly obese in NAD. AAO x 3.  Vascular Capillary refill time to digits immediate b/l. Palpable pedal pulses b/l LE. Pedal hair present. Lower extremity skin temperature gradient within normal limits. No cyanosis or clubbing noted.  Neurologic Normal speech. Pt has subjective symptoms of neuropathy. Protective sensation intact 5/5 intact bilaterally with 10g monofilament b/l.  Dermatologic Pedal skin with normal turgor, texture and tone bilaterally. No open wounds bilaterally. No interdigital macerations bilaterally. Toenails 2-5 bilaterally and R hallux well maintained with adequate length. No erythema, no edema, no drainage, no fluctuance. Hyperkeratotic lesion(s) submet head 2 left foot, submet head 2 right foot and plantar aspect b/l heel pads.  No erythema, no edema, no drainage, no fluctuance. Procedure site of L hallux noted to be completely healed with no erythema, no edema, no drainage, no purulence.  New nailplate noted proximal 1/3 of left  hallux.   Orthopedic: Normal muscle strength 5/5 to all lower extremity muscle groups bilaterally. No pain crepitus or joint limitation noted with ROM b/l. Hallux valgus with bunion deformity noted b/l lower extremities. Hammertoe(s) noted to the R 2nd toe.     Assessment:   1. Callus   2. Diabetic peripheral neuropathy associated with type 2 diabetes mellitus (HCC)    Plan:  Patient was evaluated and treated and all questions answered. -Examined patient. -Patient to continue soft, supportive shoe gear daily. -Callus(es) submet head 2 left foot, submet head 2 right foot and plantar aspect b/l heel pads pared utilizing sterile scalpel blade without complication or incident. Total number debrided =4. -Patient to report any pedal injuries to medical professional immediately. -Advised patient she may paint toenail. Also advised her to avoid having an acrylic toenail placed on her nailbed. She related understanding. -Patient/POA to call should there be question/concern in the interim.  Return in about 3 months (around 08/13/2021).  Freddie Breech, DPM

## 2021-06-18 ENCOUNTER — Encounter: Payer: Self-pay | Admitting: Podiatrist

## 2021-06-18 ENCOUNTER — Ambulatory Visit: Payer: BC Managed Care – PPO | Admitting: Podiatrist

## 2021-06-18 ENCOUNTER — Other Ambulatory Visit: Payer: Self-pay

## 2021-06-18 DIAGNOSIS — S90221A Contusion of right lesser toe(s) with damage to nail, initial encounter: Secondary | ICD-10-CM | POA: Diagnosis not present

## 2021-06-18 DIAGNOSIS — L03031 Cellulitis of right toe: Secondary | ICD-10-CM

## 2021-06-18 MED ORDER — MUPIROCIN 2 % EX OINT: 1.0000 "application " | TOPICAL_OINTMENT | Freq: Two times a day (BID) | CUTANEOUS | 2 refills | Status: DC

## 2021-06-18 MED ORDER — DOXYCYCLINE HYCLATE 100 MG PO TABS
100.0000 mg | ORAL_TABLET | Freq: Two times a day (BID) | ORAL | 0 refills | Status: DC
Start: 2021-06-18 — End: 2023-02-16

## 2021-06-18 NOTE — Patient Instructions (Signed)

## 2021-06-25 NOTE — Progress Notes (Signed)
Chief Complaint  Patient presents with   toe swelling    Swelling and redness at Rt 2nd toenail bed x Monday - no injury - no pain due to neuropathy -no draiange Tx: none      HPI: Patient is 50 y.o. female who presents today for the concerns as listed above.  She noticed redness and swelling of the right second toe.  She has neuropathy and does not recall any injury to the foot.  No drainage seen.    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      atorvastatin (LIPITOR) 20 MG tablet Take by mouth.     busPIRone (BUSPAR) 7.5 MG tablet buspirone 7.5 mg tablet     ergocalciferol (VITAMIN D2) 1.25 MG (50000 UT) capsule Take 1 capsule by mouth once a week.     escitalopram (LEXAPRO) 10 MG tablet Take by mouth.     fluconazole (DIFLUCAN) 200 MG tablet TAKE 1 TABLET BY MOUTH EVERY 3 DAYS     HYDROcodone-acetaminophen (NORCO/VICODIN) 5-325 MG tablet Take 1-2 tablets by mouth every 6 (six) hours as needed for severe pain. 8 tablet 0   liraglutide (VICTOZA) 18 MG/3ML SOPN Inject into the skin.     lisinopril (ZESTRIL) 5 MG tablet Take 1 tablet by mouth daily.     megestrol (MEGACE) 40 MG tablet Take 1 tablet (40 mg total) by mouth 2 (two) times daily. 60 tablet 1   metFORMIN (GLUCOPHAGE) 1000 MG tablet Take 1 tablet by mouth 2  (two) times daily.     naproxen (NAPROSYN) 500 MG tablet Take 1 tablet (500 mg total) by mouth 2 (two) times daily. 30 tablet 0   ondansetron (ZOFRAN) 8 MG tablet SMARTSIG:1 Tablet(s) By Mouth Every 12 Hours PRN     pregabalin (LYRICA) 150 MG capsule Take 150 mg by mouth 2 (two) times daily.     sitaGLIPtin-metformin (JANUMET) 50-1000 MG tablet Take 1 tablet by mouth 2 (two) times daily with a meal.     traZODone (DESYREL) 100 MG tablet Take by mouth.     [DISCONTINUED] amitriptyline (ELAVIL) 50 MG tablet amitriptyline 50 mg tablet  Take 1 tablet(s) every day by oral route as directed for 30 days.     [DISCONTINUED] buPROPion (WELLBUTRIN XL) 150 MG 24 hr tablet bupropion HCl XL 150 mg 24 hr tablet, extended release  Take one tablet (150 mg total) by mouth every morning.     [DISCONTINUED] gabapentin (NEURONTIN) 300 MG capsule Take 300 mg by mouth.     No current facility-administered medications on file prior to visit.    Allergies  Allergen Reactions   Penicillins     Reported from childhood    Review of Systems No fevers, chills, nausea, muscle aches, no difficulty breathing, no calf pain, no chest pain or shortness of  breath.   Physical Exam  GENERAL APPEARANCE: Alert, conversant. Appropriately groomed. No acute distress.   VASCULAR: Pedal pulses palpable DP and PT bilateral.  Capillary refill time is immediate to all digits,  Proximal to distal cooling it warm to warm.  Digital perfusion adequate.   NEUROLOGIC: sensation is intact to 5.07 monofilament at 5/5 sites bilateral.  Light touch is intact bilateral, vibratory sensation intact bilateral  MUSCULOSKELETAL: acceptable muscle strength, tone and stability bilateral.  No gross boney pedal deformities noted.  No pain, crepitus or limitation noted with foot and ankle range of motion bilateral.   DERMATOLOGIC: skin is warm, supple, and dry.  Right second toe around the toenail is swollen and red.  Once further investigated,  there is a pocket of fluid beneath the toenail.  No malodor noted, no pus or purulence expressed.  Injury likely due to rubbing the toe on the tip of a shoe during walking.     Assessment   1. Paronychia of second toe of right foot   2. Subungual hematoma of toe of right foot, initial encounter       Plan  Recommended an incision and drainage of the toe and the patient agreed.  I anesthetized the toe with lidocaine plain in a digital block fashion.  A freer elevator was used to lift the nail off the nail bed where the fluid was encountered. The nail was carefully removed in total.  Betadine was used to cleanse the nail bed.  Silvadene cream and a dressing was applied and Cullen was given instructions for soaks and dressing changes.  She was also given instructions on the new nail growing out.  Rx for doxycycline and mupirocin called in.  She will be seen back for a recheck and will call sooner if any problems arise.

## 2021-07-25 ENCOUNTER — Ambulatory Visit: Payer: BC Managed Care – PPO | Admitting: Podiatry

## 2021-07-25 ENCOUNTER — Other Ambulatory Visit: Payer: Self-pay

## 2021-07-25 DIAGNOSIS — E1142 Type 2 diabetes mellitus with diabetic polyneuropathy: Secondary | ICD-10-CM | POA: Diagnosis not present

## 2021-07-25 DIAGNOSIS — L03031 Cellulitis of right toe: Secondary | ICD-10-CM | POA: Diagnosis not present

## 2021-07-25 MED ORDER — DOXYCYCLINE HYCLATE 100 MG PO TABS: 100.0000 mg | ORAL_TABLET | Freq: Two times a day (BID) | ORAL | 0 refills | Status: DC

## 2021-07-30 NOTE — Progress Notes (Signed)
Subjective:  Patient ID: Erica [name], female    DOB: May 15, 1971,  MRN: [id number]  Chief Complaint  Patient presents with   Nail Problem    Pt states her left great toenail has came back off Tuesday. Pt also states her left 2nd toenail has discoloration and is concerned about that. Pt states she first noticed the discoloration in july.   Foot Swelling    PT states she has left foot swelling that she noticed a week ago.     50 y.o. female presents with the above complaint.  Patient presents with a complaint of right second digit mild erythema/redness.  She states is been going on for last few weeks has progressed to gotten worse.  She wanted to get evaluated make sure that there is no concern for infection.  The left side is doing much better.  She denies any other acute complaint she has not taken any antibiotics for it.  She noticed the discoloration especially redness around in July has not gotten any better.  She denies any other acute complaints.   Review of Systems: Negative except as noted in the HPI. Denies N/V/F/Ch.  Past Medical History:  Diagnosis Date   Arthritis    Depression    Diabetes mellitus    Fatty liver    Hernia, abdominal    Hypercholesteremia    IBS (irritable bowel syndrome)     Current Outpatient Medications:    doxycycline (VIBRA-TABS) 100 MG tablet, Take 1 tablet (100 mg total) by mouth 2 (two) times daily., Disp: 20 tablet, Rfl: 0   aspirin 81 MG tablet, Take 81 mg by mouth at bedtime. , Disp: , Rfl:    atorvastatin (LIPITOR) 20 MG tablet, Take by mouth., Disp: , Rfl:    busPIRone (BUSPAR) 7.5 MG tablet, buspirone 7.5 mg tablet, Disp: , Rfl:    doxycycline (VIBRA-TABS) 100 MG tablet, Take 1 tablet (100 mg total) by mouth 2 (two) times daily., Disp: 20 tablet, Rfl: 0   ergocalciferol (VITAMIN D2) 1.25 MG (50000 UT) capsule, Take 1 capsule by mouth once a week., Disp: , Rfl:    escitalopram (LEXAPRO) 10 MG tablet, Take by mouth., Disp: , Rfl:     fluconazole (DIFLUCAN) 200 MG tablet, TAKE 1 TABLET BY MOUTH EVERY 3 DAYS, Disp: , Rfl:    HYDROcodone-acetaminophen (NORCO/VICODIN) 5-325 MG tablet, Take 1-2 tablets by mouth every 6 (six) hours as needed for severe pain., Disp: 8 tablet, Rfl: 0   liraglutide (VICTOZA) 18 MG/3ML SOPN, Inject into the skin., Disp: , Rfl:    lisinopril (ZESTRIL) 5 MG tablet, Take 1 tablet by mouth daily., Disp: , Rfl:    megestrol (MEGACE) 40 MG tablet, Take 1 tablet (40 mg total) by mouth 2 (two) times daily., Disp: 60 tablet, Rfl: 1   metFORMIN (GLUCOPHAGE) 1000 MG tablet, Take 1 tablet by mouth 2 (two) times daily., Disp: , Rfl:    mupirocin ointment (BACTROBAN) 2 %, Apply 1 application topically 2 (two) times daily., Disp: 30 g, Rfl: 2   naproxen (NAPROSYN) 500 MG tablet, Take 1 tablet (500 mg total) by mouth 2 (two) times daily., Disp: 30 tablet, Rfl: 0   ondansetron (ZOFRAN) 8 MG tablet, SMARTSIG:1 Tablet(s) By Mouth Every 12 Hours PRN, Disp: , Rfl:    pregabalin (LYRICA) 150 MG capsule, Take 150 mg by mouth 2 (two) times daily., Disp: , Rfl:    sitaGLIPtin-metformin (JANUMET) 50-1000 MG tablet, Take 1 tablet by mouth 2 (two) times daily with a meal.,  Disp: , Rfl:    traZODone (DESYREL) 100 MG tablet, Take by mouth., Disp: , Rfl:   Social History   Tobacco Use  Smoking Status Never  Smokeless Tobacco Never    Allergies  Allergen Reactions   Penicillins     Reported from childhood   Objective:  There were no vitals filed for this visit. There is no height or weight on file to calculate BMI. Constitutional Well developed. Well nourished.  Vascular Dorsalis pedis pulses palpable bilaterally. Posterior tibial pulses palpable bilaterally. Capillary refill normal to all digits.  No cyanosis or clubbing noted. Pedal hair growth normal.  Neurologic Normal speech. Oriented to person, place, and time. Epicritic sensation to light touch grossly present bilaterally.  Dermatologic Nails well groomed  and normal in appearance. No open wounds. No skin lesions.  Orthopedic: Right second digit mild paronychia noted to the proximal nail fold.  No signs of trauma noted.  No loosening of the nail noted.  Nail is well adhered to the underlying nailbed.  No concern for abscess noted   Radiographs: None Assessment:   1. Diabetic peripheral neuropathy associated with type 2 diabetes mellitus (HCC)   2. Paronychia of second toe of right foot    Plan:  Patient was evaluated and treated and all questions answered.  Right second digit mild paronychia -I explained to the patient the etiology of paronychia and various treatment options were discussed given that this is very mild in nature I believe patient would benefit from round of antibiotics to help decrease your the redness and possible infection.  Patient agrees with the plan. -Doxycycline was sent to the pharmacy for skin and soft tissue prophylaxis -If there is no improvement we will discuss nail avulsion at her next clinic however the nail at this time is clinically well adhered to the underlying nailbed and no signs of abscess or infection noted.  I discussed with her if the redness gets worse go to the emergency room right away.  She states understanding  No follow-ups on file.

## 2021-08-01 ENCOUNTER — Emergency Department (HOSPITAL_BASED_OUTPATIENT_CLINIC_OR_DEPARTMENT_OTHER)
Admission: EM | Admit: 2021-08-01 | Discharge: 2021-08-01 | Disposition: A | Discharge status: Home / Self Care | Payer: BC Managed Care – PPO | Attending: Emergency Medicine | Admitting: Emergency Medicine

## 2021-08-01 ENCOUNTER — Emergency Department (HOSPITAL_BASED_OUTPATIENT_CLINIC_OR_DEPARTMENT_OTHER): Payer: BC Managed Care – PPO

## 2021-08-01 ENCOUNTER — Other Ambulatory Visit: Payer: Self-pay

## 2021-08-01 ENCOUNTER — Encounter (HOSPITAL_BASED_OUTPATIENT_CLINIC_OR_DEPARTMENT_OTHER): Payer: Self-pay | Admitting: Emergency Medicine

## 2021-08-01 DIAGNOSIS — Z7984 Long term (current) use of oral hypoglycemic drugs: Secondary | ICD-10-CM | POA: Insufficient documentation

## 2021-08-01 DIAGNOSIS — Z794 Long term (current) use of insulin: Secondary | ICD-10-CM | POA: Diagnosis not present

## 2021-08-01 DIAGNOSIS — Z20822 Contact with and (suspected) exposure to covid-19: Secondary | ICD-10-CM | POA: Diagnosis not present

## 2021-08-01 DIAGNOSIS — Z7982 Long term (current) use of aspirin: Secondary | ICD-10-CM | POA: Diagnosis not present

## 2021-08-01 DIAGNOSIS — R739 Hyperglycemia, unspecified: Secondary | ICD-10-CM

## 2021-08-01 DIAGNOSIS — R5383 Other fatigue: Secondary | ICD-10-CM | POA: Diagnosis present

## 2021-08-01 DIAGNOSIS — E1165 Type 2 diabetes mellitus with hyperglycemia: Secondary | ICD-10-CM | POA: Diagnosis not present

## 2021-08-01 LAB — URINALYSIS, ROUTINE W REFLEX MICROSCOPIC
Bilirubin Urine: NEGATIVE
Glucose, UA: >1000 mg/dL — AB
Ketones, ur: 15 mg/dL — AB
Leukocytes,Ua: NEGATIVE
Nitrite: NEGATIVE
Specific Gravity, Urine: 1.027 (ref 1.005–1.030)
pH: 5.5 (ref 5.0–8.0)

## 2021-08-01 LAB — BASIC METABOLIC PANEL
Anion gap: 14 (ref 5–15)
BUN: 9 mg/dL (ref 6–20)
CO2: 23 mmol/L (ref 22–32)
Calcium: 9.3 mg/dL (ref 8.9–10.3)
Chloride: 100 mmol/L (ref 98–111)
Creatinine, Ser: 0.73 mg/dL (ref 0.44–1.00)
GFR, Estimated: >60 mL/min (ref >60)
Glucose, Bld: 228 mg/dL — ABNORMAL HIGH (ref 70–99)
Potassium: 4 mmol/L (ref 3.5–5.1)
Sodium: 137 mmol/L (ref 135–145)

## 2021-08-01 LAB — RESP PANEL BY RT-PCR (FLU A&B, COVID) ARPGX2
Influenza A by PCR: NEGATIVE
Influenza B by PCR: NEGATIVE
SARS Coronavirus 2 by RT PCR: NEGATIVE

## 2021-08-01 LAB — CBC
HCT: 38.7 % (ref 36.0–46.0)
Hemoglobin: 12.4 g/dL (ref 12.0–15.0)
MCH: 27.9 pg (ref 26.0–34.0)
MCHC: 32 g/dL (ref 30.0–36.0)
MCV: 87.2 fL (ref 80.0–100.0)
Platelets: 357 10*3/uL (ref 150–400)
RBC: 4.44 MIL/uL (ref 3.87–5.11)
RDW: 12.9 % (ref 11.5–15.5)
WBC: 6.5 10*3/uL (ref 4.0–10.5)
nRBC: 0 % (ref 0.0–0.2)

## 2021-08-01 LAB — CBG MONITORING, ED: Glucose-Capillary: 206 mg/dL — ABNORMAL HIGH (ref 70–99)

## 2021-08-01 MED ORDER — BLOOD GLUCOSE MONITOR KIT
PACK | 0 refills | Status: DC
Start: 2021-08-01 — End: 2023-05-31

## 2021-08-01 MED ORDER — SODIUM CHLORIDE 0.9 % IV BOLUS
1000.0000 mL | Freq: Once | INTRAVENOUS | Status: AC
  Administered 2021-08-01: 1000 mL via INTRAVENOUS

## 2021-08-01 NOTE — ED Triage Notes (Signed)
Patient arrives ambulatory reports feeling tired and down this week. Reports lots of stress with family and friends. C/o feeling nauseous. States recent AIC of 11 and does not check her CBGs at home. Reports taking home meds as prescribed.

## 2021-08-01 NOTE — Discharge Instructions (Addendum)
Take your meds as directed.  Check your bs at least once a day.

## 2021-08-01 NOTE — ED Provider Notes (Signed)
[facility] Provider Note   CSN: [id number] Arrival date & time: 08/01/21  1224     History Chief Complaint  Patient presents with   Fatigue    Erica [name] is a 50 y.o. female.  Pt presents to the ED today with fatigue.  She has a hx of poorly controlled DM with her last Alc at 11.  She has not been compliant with her meds.  She has not been taking her blood sugar at home.  She did take her meds today.  No f/c.  Just weak all over.      Past Medical History:  Diagnosis Date   Arthritis    Depression    Diabetes mellitus    Fatty liver    Hernia, abdominal    Hypercholesteremia    IBS (irritable bowel syndrome)     Patient Active Problem List   Diagnosis Date Noted   Puncture wound of right foot 02/08/2020   Sinusitis 10/31/2019   Urine finding 10/31/2019   Microalbuminuria 05/27/2018   Hyperlipidemia 05/24/2017   Inflammatory disorder of digestive tract 05/24/2017   Low back pain 05/24/2017   Type 2 diabetes mellitus with neurologic complication, without long-term current use of insulin (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   Obesity 03/21/2015    Past Surgical History:  Procedure Laterality Date   CESAREAN SECTION     CHOLECYSTECTOMY       OB History     Gravida  1   Para      Term      Preterm      AB      Living  1      SAB      IAB      Ectopic      Multiple      Live Births  1           Family History  Problem Relation Age of Onset   Hypertension Mother    Heart failure Mother    Fibroids Mother    Fibroids Sister    Fibroids Maternal Grandmother    Diabetes type II Paternal Grandmother     Social History   Tobacco Use   Smoking status: Never   Smokeless tobacco: Never  Vaping Use   Vaping Use: Never used  Substance Use Topics   Alcohol  use: No   Drug use: No    Home Medications Prior to Admission medications   Medication Sig Start Date End Date Taking? Authorizing Provider  blood glucose meter kit and supplies KIT Dispense based on patient and insurance preference. Use up to four times daily as directed. 08/01/21  Yes Isla Pence, MD  aspirin 81 MG tablet Take 81 mg by mouth at bedtime.     [provider]  atorvastatin (LIPITOR) 20 MG tablet Take by mouth. 03/03/21   [provider]  busPIRone (BUSPAR) 7.5 MG tablet buspirone 7.5 mg tablet 10/23/19   [provider]  doxycycline (VIBRA-TABS) 100 MG tablet Take 1 tablet (100 mg total) by mouth 2 (two) times daily. 06/18/21   Bronson Ing, DPM  doxycycline (VIBRA-TABS) 100 MG tablet Take 1 tablet (100 mg total) by mouth 2 (two) times daily. 07/25/21   Felipa Furnace, DPM  ergocalciferol (VITAMIN D2) 1.25 MG (50000 UT) capsule Take 1 capsule by mouth once a  week. 04/18/21   [provider]  escitalopram (LEXAPRO) 10 MG tablet Take by mouth. 03/03/21   [provider]  fluconazole (DIFLUCAN) 200 MG tablet TAKE 1 TABLET BY MOUTH EVERY 3 DAYS 11/26/20   [provider]  HYDROcodone-acetaminophen (NORCO/VICODIN) 5-325 MG tablet Take 1-2 tablets by mouth every 6 (six) hours as needed for severe pain. 11/10/20   Wieters, Hallie C, PA-C  liraglutide (VICTOZA) 18 MG/3ML SOPN Inject into the skin. 09/05/19   [provider]  lisinopril (ZESTRIL) 5 MG tablet Take 1 tablet by mouth daily. 03/03/21   [provider]  megestrol (MEGACE) 40 MG tablet Take 1 tablet (40 mg total) by mouth 2 (two) times daily. 02/06/21   Shelly Bombard, MD  metFORMIN (GLUCOPHAGE) 1000 MG tablet Take 1 tablet by mouth 2 (two) times daily. 05/02/21   [provider]  mupirocin ointment (BACTROBAN) 2 % Apply 1 application topically 2 (two) times daily. 06/18/21   Bronson Ing, DPM  naproxen (NAPROSYN) 500 MG tablet Take 1 tablet  (500 mg total) by mouth 2 (two) times daily. 11/10/20   Wieters, Hallie C, PA-C  ondansetron (ZOFRAN) 8 MG tablet SMARTSIG:1 Tablet(s) By Mouth Every 12 Hours PRN 09/12/19   [provider]  pregabalin (LYRICA) 150 MG capsule Take 150 mg by mouth 2 (two) times daily. 04/17/21   [provider]  sitaGLIPtin-metformin (JANUMET) 50-1000 MG tablet Take 1 tablet by mouth 2 (two) times daily with a meal.    [provider]  traZODone (DESYREL) 100 MG tablet Take by mouth. 03/03/21   [provider]  amitriptyline (ELAVIL) 50 MG tablet amitriptyline 50 mg tablet  Take 1 tablet(s) every day by oral route as directed for 30 days.  11/10/20  [provider]  buPROPion (WELLBUTRIN XL) 150 MG 24 hr tablet bupropion HCl XL 150 mg 24 hr tablet, extended release  Take one tablet (150 mg total) by mouth every morning.  11/10/20  [provider]  gabapentin (NEURONTIN) 300 MG capsule Take 300 mg by mouth.  11/10/20  [provider]    Allergies    Penicillins  Review of Systems   Review of Systems  Constitutional:  Positive for fatigue.  Gastrointestinal:  Positive for nausea.  All other systems reviewed and are negative.  Physical Exam Updated Vital Signs BP 136/83   Pulse (!) 105   Temp 98.3 F (36.8 C) (Oral)   Resp 20   Ht 6' (1.829 m)   Wt 127.9 kg   LMP  (LMP Unknown)   SpO2 98%   BMI 38.25 kg/m   Physical Exam Vitals and nursing note reviewed.  Constitutional:      Appearance: Normal appearance. She is obese.  HENT:     Head: Normocephalic and atraumatic.     Right Ear: External ear normal.     Left Ear: External ear normal.     Nose: Nose normal.     Mouth/Throat:     Mouth: Mucous membranes are dry.  Eyes:     Extraocular Movements: Extraocular movements intact.     Conjunctiva/sclera: Conjunctivae normal.     Pupils: Pupils are equal, round, and reactive to light.  Cardiovascular:     Rate and Rhythm: Normal rate  and regular rhythm.     Pulses: Normal pulses.     Heart sounds: Normal heart sounds.  Pulmonary:     Effort: Pulmonary effort is normal.     Breath sounds: Normal breath sounds.  Abdominal:     General: Abdomen is flat. Bowel sounds are normal.     Palpations: Abdomen is soft.  Musculoskeletal:        General: Normal range of motion.     Cervical back: Normal range of motion and neck supple.  Skin:    General: Skin is warm.     Capillary Refill: Capillary refill takes less than 2 seconds.  Neurological:     General: No focal deficit present.     Mental Status: She is alert and oriented to person, place, and time.  Psychiatric:        Mood and Affect: Mood normal.        Behavior: Behavior normal.        Thought Content: Thought content normal.        Judgment: Judgment normal.    ED Results / Procedures / Treatments   Labs (all labs ordered are listed, but only abnormal results are displayed) Labs Reviewed  BASIC METABOLIC PANEL - Abnormal; Notable for the following components:      Result Value   Glucose, Bld 228 (*)    All other components within normal limits  URINALYSIS, ROUTINE W REFLEX MICROSCOPIC - Abnormal; Notable for the following components:   Glucose, UA >1,000 (*)    Hgb urine dipstick TRACE (*)    Ketones, ur 15 (*)    Protein, ur TRACE (*)    Bacteria, UA RARE (*)    All other components within normal limits  CBG MONITORING, ED - Abnormal; Notable for the following components:   Glucose-Capillary 206 (*)    All other components within normal limits  RESP PANEL BY RT-PCR (FLU A&B, COVID) ARPGX2  CBC  CBG MONITORING, ED    EKG EKG Interpretation  Date/Time:  Friday August 01 2021 14:12:31 EDT Ventricular Rate:  102 PR Interval:  159 QRS Duration: 109 QT Interval:  343 QTC Calculation: 447 R Axis:   6 Text Interpretation: Sinus tachycardia Incomplete left bundle branch block Low voltage, precordial leads Since last tracing rate faster Confirmed  by Isla Pence [phone]) on 08/01/2021 2:46:30 PM  Radiology DG Chest Portable 1 View  Result Date: 08/01/2021 CLINICAL DATA:  Altered mental status EXAM: PORTABLE CHEST 1 VIEW COMPARISON:  Chest radiograph 05/23/2012 FINDINGS: The heart is borderline enlarged. The mediastinal contours are otherwise within normal limits. There is no focal consolidation or pulmonary edema. There is no pleural effusion or pneumothorax. An accessory azygos fissure is again noted, a normal variant. There is no acute osseous abnormality. IMPRESSION: Borderline cardiomegaly. Otherwise, no radiographic evidence of acute cardiopulmonary process. Electronically Signed   By: Valetta Mole M.D.   On: 08/01/2021 14:56    Procedures Procedures   Medications Ordered in ED Medications  sodium chloride 0.9 % bolus 1,000 mL (0 mLs Intravenous Stopped 08/01/21 1459)    ED Course  I have reviewed the triage vital signs and the nursing notes.  Pertinent labs & imaging results that were available during my care of the patient were reviewed by me and considered in my medical decision making (see chart for details).    MDM Rules/Calculators/A&P                           Pt is feeling better after fluids.  She is encouraged to take her meds as directed and to check her blood sugar.  Return if worse.  Final Clinical Impression(s) / ED Diagnoses Final diagnoses:  Poorly controlled diabetes mellitus ([place])  Hyperglycemia    Rx / DC Orders ED Discharge Orders          Ordered    blood glucose meter kit and supplies KIT        08/01/21 1508             Isla Pence, MD 08/01/21 1510

## 2021-08-06 ENCOUNTER — Ambulatory Visit: Payer: BC Managed Care – PPO | Admitting: Podiatry

## 2021-08-13 ENCOUNTER — Ambulatory Visit: Payer: BC Managed Care – PPO | Admitting: Podiatry

## 2021-08-22 ENCOUNTER — Other Ambulatory Visit: Payer: Self-pay | Admitting: Podiatry

## 2021-08-22 ENCOUNTER — Other Ambulatory Visit: Payer: Self-pay | Admitting: Obstetrics

## 2021-08-22 DIAGNOSIS — R9389 Abnormal findings on diagnostic imaging of other specified body structures: Secondary | ICD-10-CM

## 2021-08-22 NOTE — Telephone Encounter (Signed)
Please Advise

## 2021-08-29 ENCOUNTER — Other Ambulatory Visit: Payer: Self-pay

## 2021-08-29 ENCOUNTER — Encounter: Payer: Self-pay | Admitting: Podiatry

## 2021-08-29 ENCOUNTER — Ambulatory Visit: Payer: BC Managed Care – PPO | Admitting: Podiatry

## 2021-08-29 DIAGNOSIS — E1142 Type 2 diabetes mellitus with diabetic polyneuropathy: Secondary | ICD-10-CM

## 2021-08-29 DIAGNOSIS — L03031 Cellulitis of right toe: Secondary | ICD-10-CM | POA: Diagnosis not present

## 2021-08-29 DIAGNOSIS — B351 Tinea unguium: Secondary | ICD-10-CM

## 2021-08-29 DIAGNOSIS — M79675 Pain in left toe(s): Secondary | ICD-10-CM | POA: Diagnosis not present

## 2021-08-29 NOTE — Progress Notes (Signed)
Subjective:  Patient ID: Erica [name], female    DOB: 1971/06/18,  MRN: [id number]  Chief Complaint  Patient presents with   Nail Problem    50 y.o. female presents with the above complaint.  Patient presents with a complaint of right second digit mild erythema/redness.  She states she is doing a lot better no further pain.  She has completed course of antibiotics she denies any other acute complaint she would like to have her nails debrided down x10.  Pain on palpation pain with ambulation.   Review of Systems: Negative except as noted in the HPI. Denies N/V/F/Ch.  Past Medical History:  Diagnosis Date   Arthritis    Depression    Diabetes mellitus    Fatty liver    Hernia, abdominal    Hypercholesteremia    IBS (irritable bowel syndrome)     Current Outpatient Medications:    aspirin 81 MG tablet, Take 81 mg by mouth at bedtime. , Disp: , Rfl:    atorvastatin (LIPITOR) 20 MG tablet, Take by mouth., Disp: , Rfl:    blood glucose meter kit and supplies KIT, Dispense based on patient and insurance preference. Use up to four times daily as directed., Disp: 1 each, Rfl: 0   busPIRone (BUSPAR) 7.5 MG tablet, buspirone 7.5 mg tablet, Disp: , Rfl:    doxycycline (VIBRA-TABS) 100 MG tablet, Take 1 tablet (100 mg total) by mouth 2 (two) times daily., Disp: 20 tablet, Rfl: 0   doxycycline (VIBRA-TABS) 100 MG tablet, Take 1 tablet (100 mg total) by mouth 2 (two) times daily., Disp: 20 tablet, Rfl: 0   ergocalciferol (VITAMIN D2) 1.25 MG (50000 UT) capsule, Take 1 capsule by mouth once a week., Disp: , Rfl:    escitalopram (LEXAPRO) 10 MG tablet, Take by mouth., Disp: , Rfl:    fluconazole (DIFLUCAN) 200 MG tablet, TAKE 1 TABLET BY MOUTH EVERY 3 DAYS, Disp: , Rfl:    HYDROcodone-acetaminophen (NORCO/VICODIN) 5-325 MG tablet, Take 1-2 tablets by mouth every 6 (six) hours as needed for severe pain., Disp: 8 tablet, Rfl: 0   liraglutide (VICTOZA) 18 MG/3ML SOPN, Inject into the skin.,  Disp: , Rfl:    lisinopril (ZESTRIL) 5 MG tablet, Take 1 tablet by mouth daily., Disp: , Rfl:    megestrol (MEGACE) 40 MG tablet, Take 1 tablet (40 mg total) by mouth 2 (two) times daily., Disp: 60 tablet, Rfl: 1   metFORMIN (GLUCOPHAGE) 1000 MG tablet, Take 1 tablet by mouth 2 (two) times daily., Disp: , Rfl:    mupirocin ointment (BACTROBAN) 2 %, Apply 1 application topically 2 (two) times daily., Disp: 30 g, Rfl: 2   naproxen (NAPROSYN) 500 MG tablet, Take 1 tablet (500 mg total) by mouth 2 (two) times daily., Disp: 30 tablet, Rfl: 0   ondansetron (ZOFRAN) 8 MG tablet, SMARTSIG:1 Tablet(s) By Mouth Every 12 Hours PRN, Disp: , Rfl:    pregabalin (LYRICA) 150 MG capsule, Take 150 mg by mouth 2 (two) times daily., Disp: , Rfl:    sitaGLIPtin-metformin (JANUMET) 50-1000 MG tablet, Take 1 tablet by mouth 2 (two) times daily with a meal., Disp: , Rfl:    traZODone (DESYREL) 100 MG tablet, Take by mouth., Disp: , Rfl:   Social History   Tobacco Use  Smoking Status Never  Smokeless Tobacco Never    Allergies  Allergen Reactions   Penicillins     Reported from childhood   Objective:  There were no vitals filed for this visit. There  is no height or weight on file to calculate BMI. Constitutional Well developed. Well nourished.  Vascular Dorsalis pedis pulses palpable bilaterally. Posterior tibial pulses palpable bilaterally. Capillary refill normal to all digits.  No cyanosis or clubbing noted. Pedal hair growth normal.  Neurologic Normal speech. Oriented to person, place, and time. Epicritic sensation to light touch grossly present bilaterally.  Dermatologic Nail Exam: Pt has thick disfigured discolored nails with subungual debris noted bilateral entire nail hallux through fifth toenails.  Pain on palpation to the nails. No open wounds. No skin lesions.  Orthopedic: Right second digit mild paronychia noted to the proximal nail fold.  No signs of trauma noted.  No loosening of the  nail noted.  Nail is well adhered to the underlying nailbed.  No concern for abscess noted   Radiographs: None Assessment:   1. Diabetic peripheral neuropathy associated with type 2 diabetes mellitus (HCC)   2. Paronychia of second toe of right foot   3. Pain due to onychomycosis of toenails of both feet     Plan:  Patient was evaluated and treated and all questions answered.  Right second digit mild paronychia -Clinically the paronychia has completely resolved with completion of antibiotic course.  At this time and I discussed shoe gear modification take the pressure away from the distal end of the toe.  If any foot and ankle issues arise in future of asked her to come see me.  She states understanding.  Onychomycosis with pain  -Nails palliatively debrided as below. -Educated on self-care  Procedure: Nail Debridement Rationale: pain  Type of Debridement: manual, sharp debridement. Instrumentation: Nail nipper, rotary burr. Number of Nails: 10  Procedures and Treatment: Consent by patient was obtained for treatment procedures. The patient understood the discussion of treatment and procedures well. All questions were answered thoroughly reviewed. Debridement of mycotic and hypertrophic toenails, 1 through 5 bilateral and clearing of subungual debris. No ulceration, no infection noted.  Return Visit-Office Procedure: Patient instructed to return to the office for a follow up visit 3 months for continued evaluation and treatment.  Boneta Lucks, DPM    No follow-ups on file.   No follow-ups on file.

## 2021-10-15 ENCOUNTER — Other Ambulatory Visit: Payer: Self-pay | Admitting: Obstetrics

## 2021-10-15 ENCOUNTER — Other Ambulatory Visit: Payer: Self-pay | Admitting: Family Medicine

## 2021-10-15 DIAGNOSIS — Z1231 Encounter for screening mammogram for malignant neoplasm of breast: Secondary | ICD-10-CM

## 2021-11-19 ENCOUNTER — Ambulatory Visit
Admission: RE | Admit: 2021-11-19 | Discharge: 2021-11-19 | Disposition: A | Discharge status: Home / Self Care | Payer: BC Managed Care – PPO | Source: Ambulatory Visit | Attending: Family Medicine | Admitting: Family Medicine

## 2021-11-19 DIAGNOSIS — Z1231 Encounter for screening mammogram for malignant neoplasm of breast: Secondary | ICD-10-CM

## 2021-11-28 ENCOUNTER — Other Ambulatory Visit: Payer: Self-pay

## 2021-11-28 ENCOUNTER — Ambulatory Visit (INDEPENDENT_AMBULATORY_CARE_PROVIDER_SITE_OTHER): Payer: BC Managed Care – PPO | Admitting: Podiatry

## 2021-11-28 DIAGNOSIS — B351 Tinea unguium: Secondary | ICD-10-CM

## 2021-11-28 DIAGNOSIS — Z79899 Other long term (current) drug therapy: Secondary | ICD-10-CM

## 2021-12-01 ENCOUNTER — Other Ambulatory Visit: Payer: Self-pay | Admitting: Podiatry

## 2021-12-02 LAB — HEPATIC FUNCTION PANEL
ALT: 17 IU/L (ref 0–32)
AST: 19 IU/L (ref 0–40)
Albumin: 4.1 g/dL (ref 3.8–4.8)
Alkaline Phosphatase: 96 IU/L (ref 44–121)
Bilirubin Total: 0.4 mg/dL (ref 0.0–1.2)
Bilirubin, Direct: 0.13 mg/dL (ref 0.00–0.40)
Total Protein: 6.7 g/dL (ref 6.0–8.5)

## 2021-12-02 MED ORDER — TERBINAFINE HCL 250 MG PO TABS: 250.0000 mg | ORAL_TABLET | Freq: Every day | ORAL | 0 refills | Status: DC

## 2021-12-02 NOTE — Addendum Note (Signed)
Addended by: Nicholes Rough on: 12/02/2021 08:23 AM   Modules accepted: Orders

## 2021-12-02 NOTE — Progress Notes (Signed)
Subjective:  Patient ID: Erica [name], female    DOB: 1971-04-02,  MRN: [id number]  Chief Complaint  Patient presents with   Diabetes    50 y.o. female presents with the above complaint.  Patient presents with concern for bilateral second digit onychomycosis.  Patient states that he has gotten thicker discolored.  She wanted to get it evaluated.  She is a diabetic with last A1cThat is unknown.  She would like to discuss treatment options fungus.  She has not seen anyone else prior to seeing me.  For this.  She does not have any history of liver issues.   Review of Systems: Negative except as noted in the HPI. Denies N/V/F/Ch.  Past Medical History:  Diagnosis Date   Arthritis    Depression    Diabetes mellitus    Fatty liver    Hernia, abdominal    Hypercholesteremia    IBS (irritable bowel syndrome)     Current Outpatient Medications:    aspirin 81 MG tablet, Take 81 mg by mouth at bedtime. , Disp: , Rfl:    atorvastatin (LIPITOR) 20 MG tablet, Take by mouth., Disp: , Rfl:    blood glucose meter kit and supplies KIT, Dispense based on patient and insurance preference. Use up to four times daily as directed., Disp: 1 each, Rfl: 0   busPIRone (BUSPAR) 7.5 MG tablet, buspirone 7.5 mg tablet, Disp: , Rfl:    doxycycline (VIBRA-TABS) 100 MG tablet, Take 1 tablet (100 mg total) by mouth 2 (two) times daily., Disp: 20 tablet, Rfl: 0   doxycycline (VIBRA-TABS) 100 MG tablet, Take 1 tablet (100 mg total) by mouth 2 (two) times daily., Disp: 20 tablet, Rfl: 0   ergocalciferol (VITAMIN D2) 1.25 MG (50000 UT) capsule, Take 1 capsule by mouth once a week., Disp: , Rfl:    escitalopram (LEXAPRO) 10 MG tablet, Take by mouth., Disp: , Rfl:    fluconazole (DIFLUCAN) 200 MG tablet, TAKE 1 TABLET BY MOUTH EVERY 3 DAYS, Disp: , Rfl:    HYDROcodone-acetaminophen (NORCO/VICODIN) 5-325 MG tablet, Take 1-2 tablets by mouth every 6 (six) hours as needed for severe pain., Disp: 8 tablet, Rfl: 0    liraglutide (VICTOZA) 18 MG/3ML SOPN, Inject into the skin., Disp: , Rfl:    lisinopril (ZESTRIL) 5 MG tablet, Take 1 tablet by mouth daily., Disp: , Rfl:    megestrol (MEGACE) 40 MG tablet, Take 1 tablet (40 mg total) by mouth 2 (two) times daily., Disp: 60 tablet, Rfl: 1   metFORMIN (GLUCOPHAGE) 1000 MG tablet, Take 1 tablet by mouth 2 (two) times daily., Disp: , Rfl:    mupirocin ointment (BACTROBAN) 2 %, Apply 1 application topically 2 (two) times daily., Disp: 30 g, Rfl: 2   naproxen (NAPROSYN) 500 MG tablet, Take 1 tablet (500 mg total) by mouth 2 (two) times daily., Disp: 30 tablet, Rfl: 0   ondansetron (ZOFRAN) 8 MG tablet, SMARTSIG:1 Tablet(s) By Mouth Every 12 Hours PRN, Disp: , Rfl:    pregabalin (LYRICA) 150 MG capsule, Take 150 mg by mouth 2 (two) times daily., Disp: , Rfl:    sitaGLIPtin-metformin (JANUMET) 50-1000 MG tablet, Take 1 tablet by mouth 2 (two) times daily with a meal., Disp: , Rfl:    terbinafine (LAMISIL) 250 MG tablet, Take 1 tablet (250 mg total) by mouth daily., Disp: 90 tablet, Rfl: 0   traZODone (DESYREL) 100 MG tablet, Take by mouth., Disp: , Rfl:   Social History   Tobacco Use  Smoking Status  Never  Smokeless Tobacco Never    Allergies  Allergen Reactions   Penicillins     Reported from childhood   Objective:  There were no vitals filed for this visit. There is no height or weight on file to calculate BMI. Constitutional Well developed. Well nourished.  Vascular Dorsalis pedis pulses palpable bilaterally. Posterior tibial pulses palpable bilaterally. Capillary refill normal to all digits.  No cyanosis or clubbing noted. Pedal hair growth normal.  Neurologic Normal speech. Oriented to person, place, and time. Epicritic sensation to light touch grossly present bilaterally.  Dermatologic Nails thickened elongated dystrophic mycotic discolored toenails x2 bilateral second.  Mild pain on palpation Skin within normal limits  Orthopedic: Normal  joint ROM without pain or crepitus bilaterally. No visible deformities. No bony tenderness.   Radiographs: None Assessment:   1. Onychomycosis due to dermatophyte   2. Long-term use of high-risk medication   3. Nail fungus    Plan:  Patient was evaluated and treated and all questions answered.  Bilateral second digit onychomycosis -Educated the patient on the etiology of onychomycosis and various treatment options associated with improving the fungal load.  I explained to the patient that there is 3 treatment options available to treat the onychomycosis including topical, p.o., laser treatment.  Patient elected to undergo p.o. options with Lamisil/terbinafine therapy.  In order for me to start the medication therapy, I explained to the patient the importance of evaluating the liver and obtaining the liver function test.  Once the liver function test comes back normal I will start him on 26-monthcourse of Lamisil therapy.  Patient understood all risk and would like to proceed with Lamisil therapy.  I have asked the patient to immediately stop the Lamisil therapy if she has any reactions to it and call the office or go to the emergency room right away.  Patient states understanding   No follow-ups on file.

## 2021-12-03 IMAGING — US US PELVIS COMPLETE WITH TRANSVAGINAL
1 series · 13 of 25 positions shown · non-contrast
Comparison: 12/30/2006

CLINICAL DATA: Irregular menstrual cycles.  Assess IUD placement.

EXAM:
TRANSABDOMINAL AND TRANSVAGINAL ULTRASOUND OF PELVIS
TECHNIQUE: Both transabdominal and transvaginal ultrasound examinations of the
pelvis were performed. Transabdominal technique was performed for
global imaging of the pelvis including uterus, ovaries, adnexal
regions, and pelvic cul-de-sac. It was necessary to proceed with
endovaginal exam following the transabdominal exam to visualize the
endometrium and ovaries.

[Series 1: us pelvis complete with transvaginal · 0.17mm/px · 39 acquisitions, 13 frames shown]
[im 1/39]
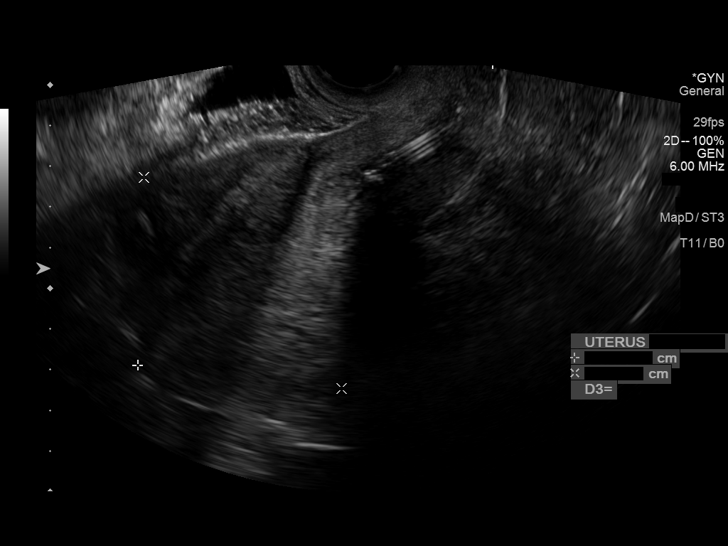
[im 4/39]
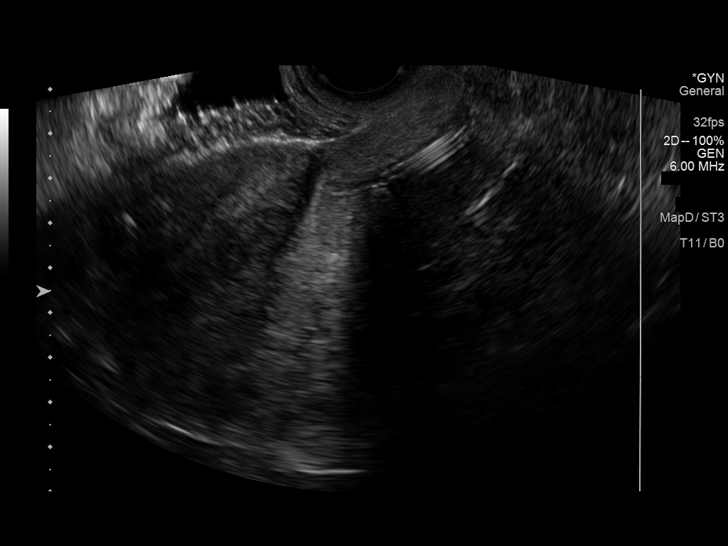
[im 7/39]
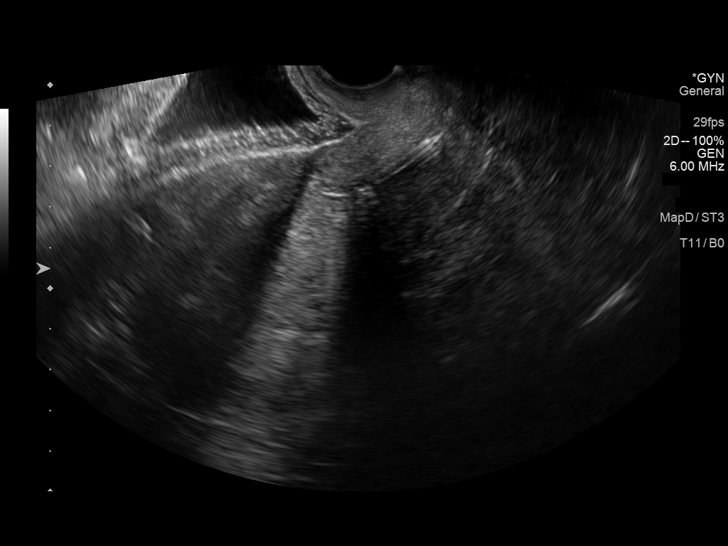
[im 10/39]
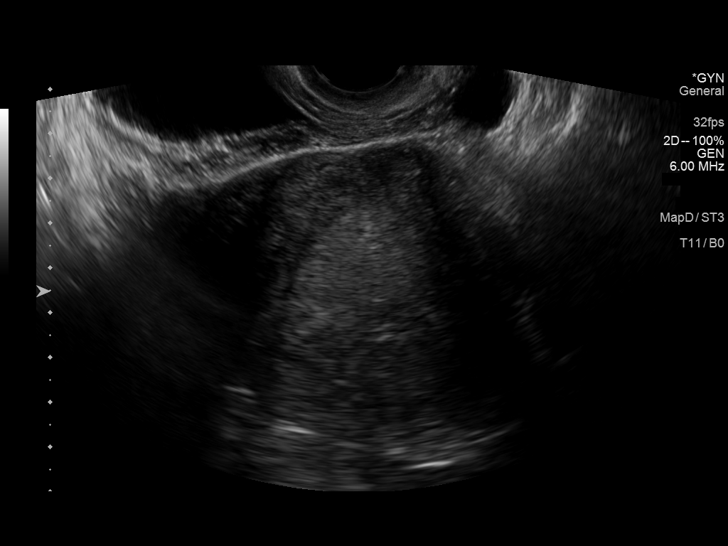
[im 13/39]
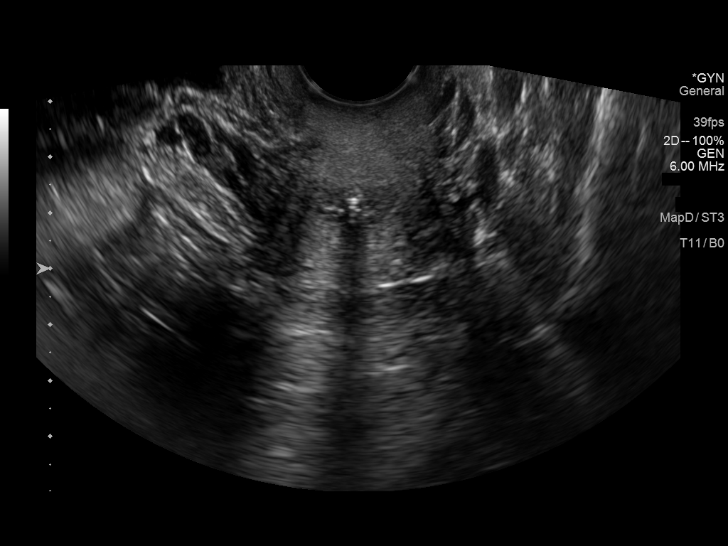
[im 16/39]
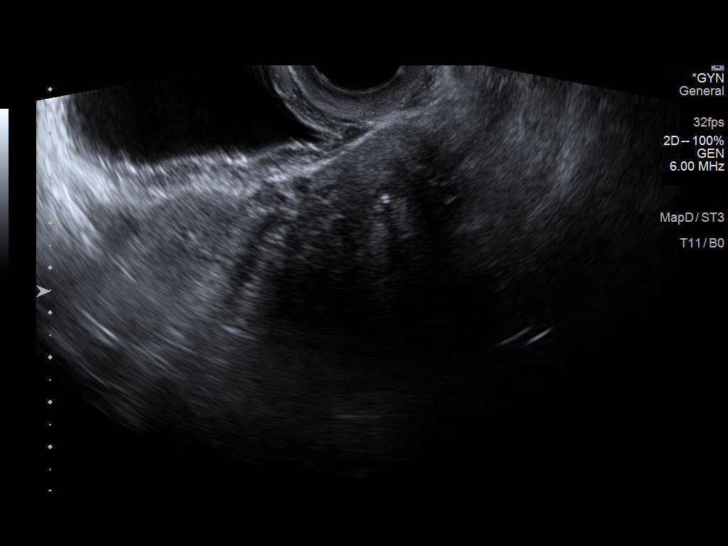
[im 20/39]
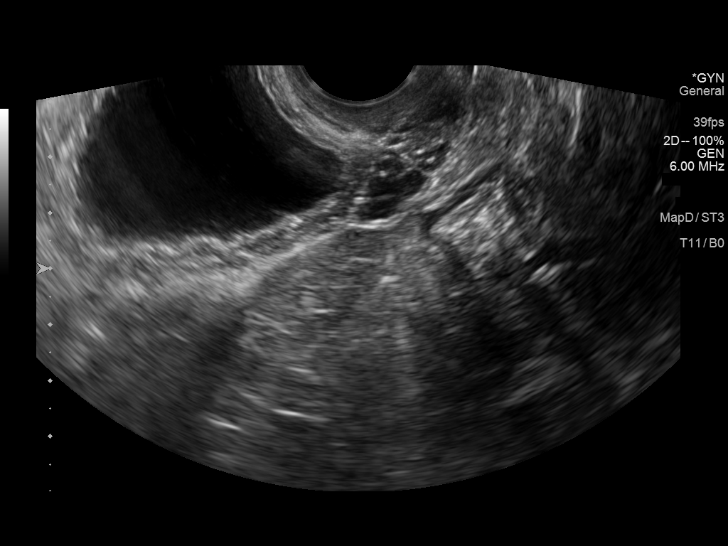
[im 23/39]
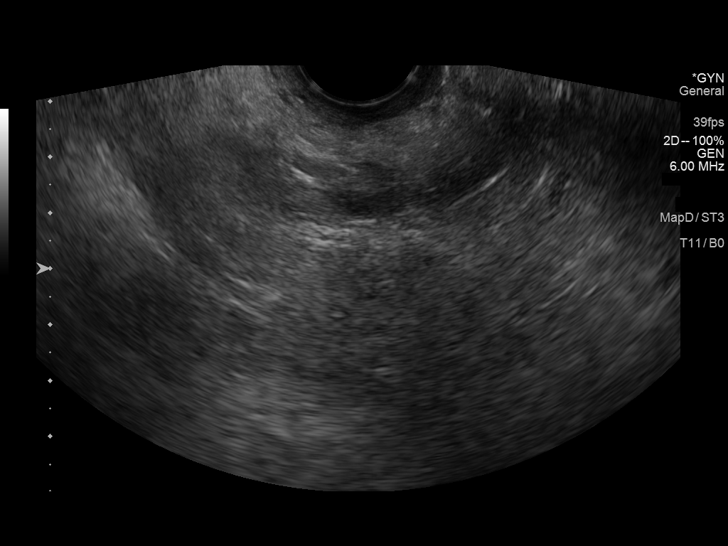
[im 26/39]
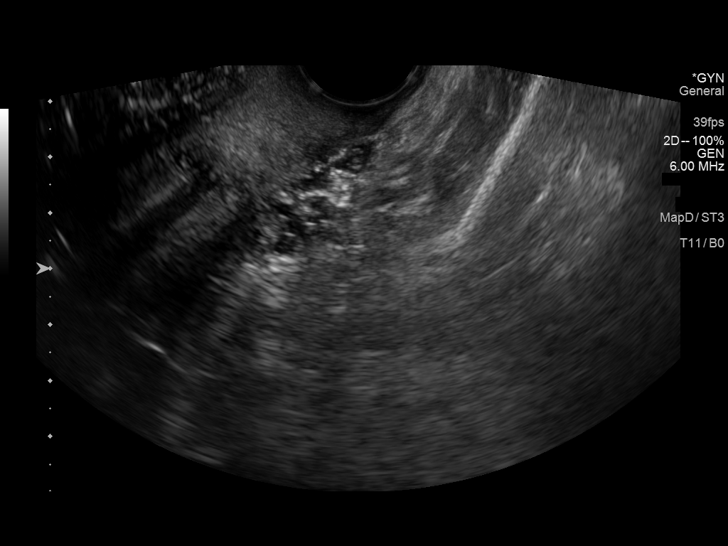
[im 29/39]
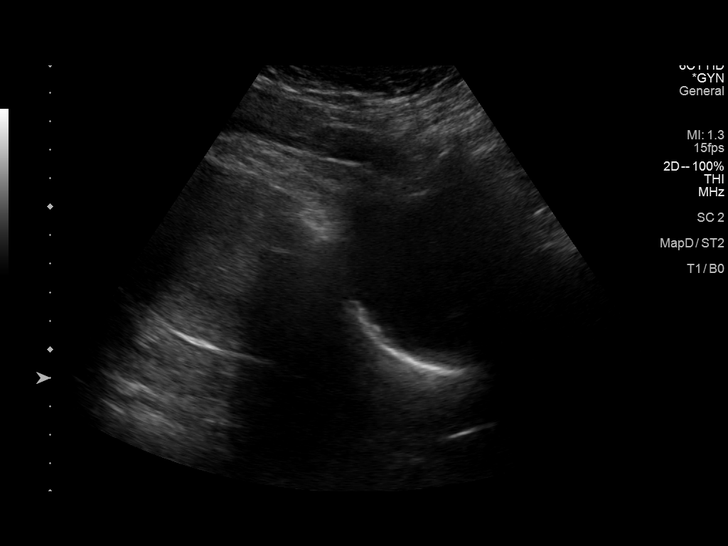
[im 32/39]
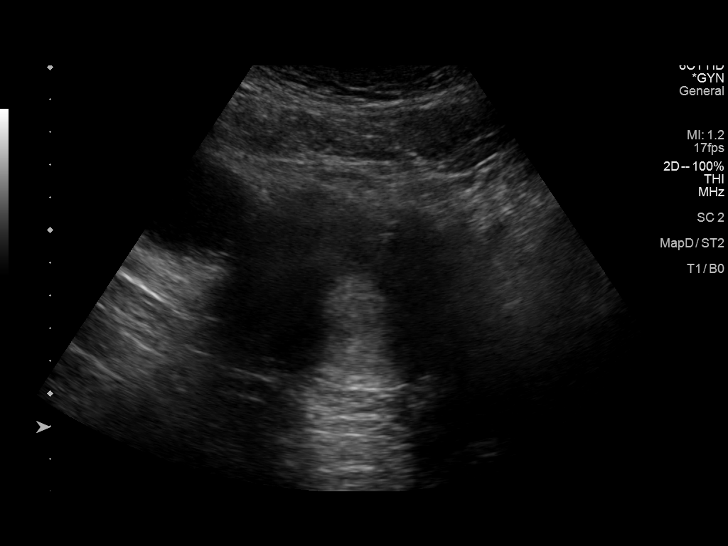
[im 35/39]
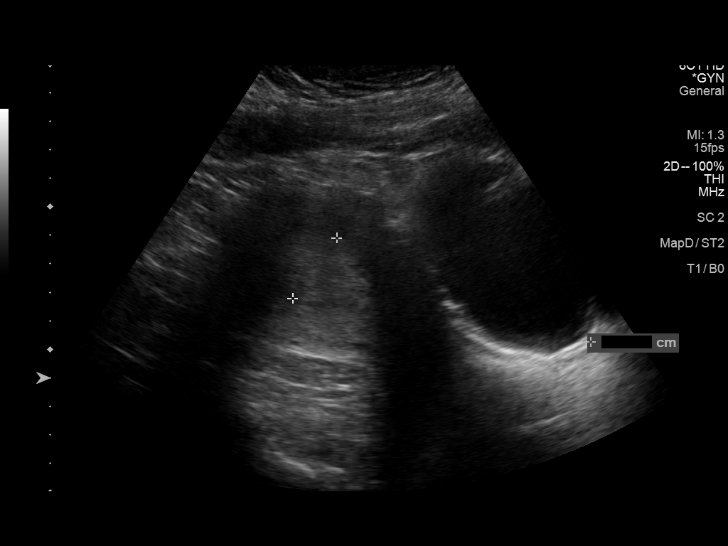
[im 39/39]
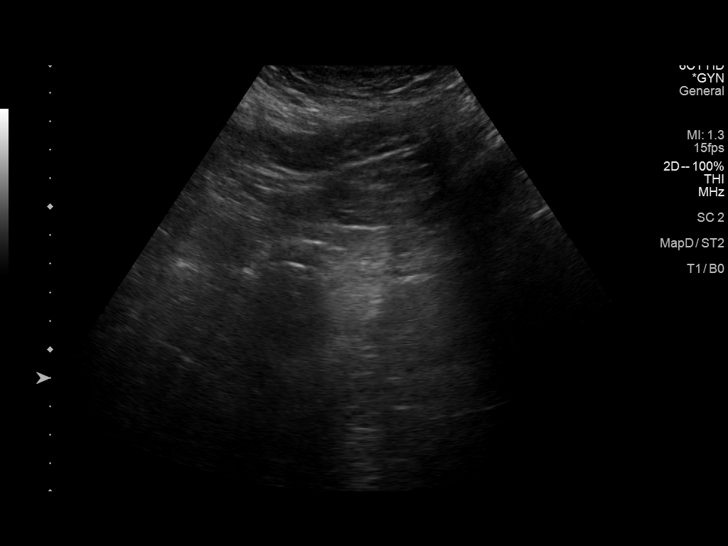

[13 of 25 positions shown; findings below may reference images not displayed]

FINDINGS: Uterus

Measurements: 7.1 x 7.2 x 11.4 cm = volume: 305 mL. No fibroids or
other mass visualized.

Endometrium

Thickness: 20 mm. No significant heterogeneity or focal mass
visualized. IUD low in position located over the endometrial canal
of the lower uterine segment.

Right ovary

Not visualized.

Left ovary

Not visualized.

Other findings

No abnormal free fluid.
IMPRESSION: 1. Thickened endometrium measuring 20 mm. No focal endometrial mass
visualized. Findings may be seen with hyperplasia, submucosal
fibroid or malignancy. Recommend gyn consultation.
2. IUD low in position located over the endometrial canal of the
lower uterine segment.
3. Nonvisualization of the ovaries.

## 2021-12-23 ENCOUNTER — Ambulatory Visit: Payer: BC Managed Care – PPO | Admitting: Podiatry

## 2022-03-09 ENCOUNTER — Other Ambulatory Visit: Payer: Self-pay

## 2022-03-09 ENCOUNTER — Ambulatory Visit (INDEPENDENT_AMBULATORY_CARE_PROVIDER_SITE_OTHER): Payer: BC Managed Care – PPO | Admitting: Podiatry

## 2022-03-09 ENCOUNTER — Encounter: Payer: Self-pay | Admitting: Podiatry

## 2022-03-09 DIAGNOSIS — M2141 Flat foot [pes planus] (acquired), right foot: Secondary | ICD-10-CM

## 2022-03-09 DIAGNOSIS — E119 Type 2 diabetes mellitus without complications: Secondary | ICD-10-CM

## 2022-03-09 DIAGNOSIS — M2142 Flat foot [pes planus] (acquired), left foot: Secondary | ICD-10-CM

## 2022-03-09 DIAGNOSIS — E1142 Type 2 diabetes mellitus with diabetic polyneuropathy: Secondary | ICD-10-CM

## 2022-03-09 DIAGNOSIS — M2042 Other hammer toe(s) (acquired), left foot: Secondary | ICD-10-CM

## 2022-03-09 DIAGNOSIS — M2041 Other hammer toe(s) (acquired), right foot: Secondary | ICD-10-CM | POA: Diagnosis not present

## 2022-03-09 DIAGNOSIS — S90221A Contusion of right lesser toe(s) with damage to nail, initial encounter: Secondary | ICD-10-CM | POA: Diagnosis not present

## 2022-03-15 NOTE — Progress Notes (Signed)
ANNUAL DIABETIC FOOT EXAM ? ?Subjective: ?Erica [name] presents today for annual diabetic foot examination. ? ?Patient relates 51 year h/o diabetes. ? ?Patient has h/o foot ulcer of right foot, which healed via help of local wound care. ? ?Patient did not check blood glucose this morning. ? ?Risk factors: diabetes, history of foot/leg ulcer, HTN, hyperlipidemia. ? ?[name], NP is patient's PCP. Last visit was March 03, 2021. ? ?Past Medical History:  ?Diagnosis Date  ? Arthritis   ? Depression   ? Diabetes mellitus   ? Fatty liver   ? Hernia, abdominal   ? Hypercholesteremia   ? IBS (irritable bowel syndrome)   ? ?Patient Active Problem List  ? Diagnosis Date Noted  ? Puncture wound of right foot 02/08/2020  ? Sinusitis 10/31/2019  ? Urine finding 10/31/2019  ? Microalbuminuria 05/27/2018  ? Hyperlipidemia 05/24/2017  ? Inflammatory disorder of digestive tract 05/24/2017  ? Low back pain 05/24/2017  ? Type 2 diabetes mellitus with neurologic complication, without long-term current use of insulin ([place]) 05/24/2017  ? Vitamin D deficiency 05/24/2017  ? Otalgia of both ears 06/09/2016  ? Temporomandibular joint (TMJ) pain 06/09/2016  ? Peripheral nerve disease 06/03/2016  ? Obesity 03/21/2015  ? ?Past Surgical History:  ?Procedure Laterality Date  ? CESAREAN SECTION    ? CHOLECYSTECTOMY    ? ?Current Outpatient Medications on File Prior to Visit  ?Medication Sig Dispense Refill  ? atorvastatin (LIPITOR) 20 MG tablet Take 1 tablet by mouth daily.    ? Insulin Pen Needle (GNP ULTICARE PEN NEEDLES) 32G X 4 MM MISC USE ONCE DAILY AS DIRECTED - NEED APPOINTMENT FOR MORE REFILLS    ? pregabalin (LYRICA) 150 MG capsule Take 1 capsule by mouth 2 (two) times daily.    ? ACCU-CHEK GUIDE test strip USE AS DIRECTED TO CHECK BLOOD SUGAR 4 TIMES DAILY    ? Accu-Chek Softclix Lancets lancets 4 (four) times daily.    ? aspirin 81 MG tablet Take 81 mg by mouth at bedtime.     ? atorvastatin (LIPITOR) 20 MG tablet Take  by mouth.    ? blood glucose meter kit and supplies KIT Dispense based on patient and insurance preference. Use up to four times daily as directed. 1 each 0  ? busPIRone (BUSPAR) 7.5 MG tablet buspirone 7.5 mg tablet    ? doxycycline (VIBRA-TABS) 100 MG tablet Take 1 tablet (100 mg total) by mouth 2 (two) times daily. 20 tablet 0  ? doxycycline (VIBRA-TABS) 100 MG tablet Take 1 tablet (100 mg total) by mouth 2 (two) times daily. 20 tablet 0  ? ergocalciferol (VITAMIN D2) 1.25 MG (50000 UT) capsule Take 1 capsule by mouth once a week.    ? escitalopram (LEXAPRO) 10 MG tablet Take by mouth.    ? fluconazole (DIFLUCAN) 200 MG tablet TAKE 1 TABLET BY MOUTH EVERY 3 DAYS    ? GNP ULTICARE PEN NEEDLES 32G X 4 MM MISC USE ONCE DAILY AS DIRECTED - NEED APPOINTMENT FOR MORE REFILLS    ? HYDROcodone-acetaminophen (NORCO/VICODIN) 5-325 MG tablet Take 1-2 tablets by mouth every 6 (six) hours as needed for severe pain. 8 tablet 0  ? liraglutide (VICTOZA) 18 MG/3ML SOPN Inject into the skin.    ? lisinopril (ZESTRIL) 5 MG tablet Take 1 tablet by mouth daily.    ? megestrol (MEGACE) 40 MG tablet Take 1 tablet (40 mg total) by mouth 2 (two) times daily. 60 tablet 1  ? metFORMIN (GLUCOPHAGE) 1000 MG  tablet Take 1 tablet by mouth 2 (two) times daily.    ? mupirocin ointment (BACTROBAN) 2 % Apply 1 application topically 2 (two) times daily. 30 g 2  ? naproxen (NAPROSYN) 500 MG tablet Take 1 tablet (500 mg total) by mouth 2 (two) times daily. 30 tablet 0  ? ondansetron (ZOFRAN) 8 MG tablet SMARTSIG:1 Tablet(s) By Mouth Every 12 Hours PRN    ? pregabalin (LYRICA) 150 MG capsule Take 150 mg by mouth 2 (two) times daily.    ? sitaGLIPtin-metformin (JANUMET) 50-1000 MG tablet Take 1 tablet by mouth 2 (two) times daily with a meal.    ? terbinafine (LAMISIL) 250 MG tablet Take 1 tablet (250 mg total) by mouth daily. 90 tablet 0  ? traZODone (DESYREL) 100 MG tablet Take by mouth.    ? [DISCONTINUED] amitriptyline (ELAVIL) 50 MG tablet  amitriptyline 50 mg tablet ? Take 1 tablet(s) every day by oral route as directed for 30 days.    ? [DISCONTINUED] buPROPion (WELLBUTRIN XL) 150 MG 24 hr tablet bupropion HCl XL 150 mg 24 hr tablet, extended release ? Take one tablet (150 mg total) by mouth every morning.    ? [DISCONTINUED] gabapentin (NEURONTIN) 300 MG capsule Take 300 mg by mouth.    ? ?No current facility-administered medications on file prior to visit.  ?  ?Allergies  ?Allergen Reactions  ? Penicillins   ?  Reported from childhood  ? ?Social History  ? ?Occupational History  ? Not on file  ?Tobacco Use  ? Smoking status: Never  ? Smokeless tobacco: Never  ?Vaping Use  ? Vaping Use: Never used  ?Substance and Sexual Activity  ? Alcohol use: No  ? Drug use: No  ? Sexual activity: Not Currently  ?  Birth control/protection: None  ? ?Family History  ?Problem Relation Age of Onset  ? Hypertension Mother   ? Heart failure Mother   ? Fibroids Mother   ? Fibroids Sister   ? Fibroids Maternal Grandmother   ? Diabetes type II Paternal Grandmother   ? ? ?There is no immunization history on file for this patient.  ? ?Review of Systems: Negative except as noted in the HPI.  ? ?Objective: ?There were no vitals filed for this visit. ? ?Erica [name] is a pleasant 51 y.o. female in NAD. AAO X 3. ? ?Vascular Examination: ?CFT <3 seconds b/l LE. Palpable DP/PT pulses b/l LE. Digital hair present b/l. Skin temperature gradient WNL b/l. No pain with calf compression b/l. No edema noted b/l. No cyanosis or clubbing noted b/l LE. ? ?Dermatological Examination: ?Pedal skin is warm and supple b/l LE. No open wounds b/l LE. No interdigital macerations noted b/l LE. Toenails 1-5 right foot, 3-5 left foot, and L hallux well maintained with adequate length. No erythema, no edema, no drainage, no fluctuance. There is evidence of subacute subungual hematoma of the L 2nd toe. There is onycholysis of nailplate. There is no  tenderness to palpation. No hyperkeratotic  nor porokeratotic lesions present on today's visit. ? ?Neurological Examination: ?Pt has subjective symptoms of neuropathy. Protective sensation intact 5/5 intact bilaterally with 10g monofilament b/l. Vibratory sensation intact b/l. Proprioception intact bilaterally. Clonus negative b/l. ? ?Musculoskeletal Examination: ?Muscle strength 5/5 to all lower extremity muscle groups bilaterally. Hammertoe deformity noted 2-5 b/l. Pes planus deformity noted bilateral LE. ? ?Footwear Assessment: ?Does the patient wear appropriate shoes? Yes. ?Does the patient need inserts/orthotics? No. ? ?Assessment: ?1. Subungual hematoma of toe of right foot, initial   encounter   ?2. Acquired hammertoes of both feet   ?3. Pes planus of both feet   ?4. Diabetic peripheral neuropathy associated with type 2 diabetes mellitus ([place])   ?5. Encounter for diabetic foot exam ([place])   ?  ?ADA Risk Categorization: ?High Risk  ?Patient has one or more of the following: ?Loss of protective sensation ?Absent pedal pulses ?Severe Foot deformity ?History of foot ulcer ? ?Plan: ?-Patient was evaluated and treated. All patient's and/or POA's questions/concerns answered on today's visit. ?-Diabetic foot examination performed today. ?-Continue foot and shoe inspections daily. Monitor blood glucose per PCP/Endocrinologist's recommendations. ?-Loose nailplate L 2nd toe gently debrided to level of adherence. Digit cleansed with alcohol. triple antibiotic ointment applied to nailbed followed by light dressing. No further treatment required by patient. Patient informed toenail will grow back in 3-4 months. ?-Patient/POA to call should there be question/concern in the interim. ?Return in about 3 months (around 06/09/2022). ? ?Marzetta Board, DPM ?

## 2022-04-20 ENCOUNTER — Ambulatory Visit (INDEPENDENT_AMBULATORY_CARE_PROVIDER_SITE_OTHER): Payer: BC Managed Care – PPO | Admitting: Advanced Practice Midwife

## 2022-04-20 ENCOUNTER — Encounter: Payer: Self-pay | Admitting: Advanced Practice Midwife

## 2022-04-20 ENCOUNTER — Encounter: Payer: Self-pay | Admitting: Obstetrics

## 2022-04-20 VITALS — BP 145/86 | HR 74 | Wt 299.0 lb

## 2022-04-20 DIAGNOSIS — N939 Abnormal uterine and vaginal bleeding, unspecified: Secondary | ICD-10-CM

## 2022-04-20 DIAGNOSIS — L819 Disorder of pigmentation, unspecified: Secondary | ICD-10-CM | POA: Diagnosis not present

## 2022-04-20 DIAGNOSIS — Z01419 Encounter for gynecological examination (general) (routine) without abnormal findings: Secondary | ICD-10-CM | POA: Diagnosis not present

## 2022-04-20 MED ORDER — CYCLOBENZAPRINE HCL 10 MG PO TABS: ORAL_TABLET | ORAL | 0 refills | Status: DC

## 2022-04-20 MED ORDER — MISOPROSTOL 200 MCG PO TABS: ORAL_TABLET | ORAL | 0 refills | Status: DC

## 2022-04-20 NOTE — Progress Notes (Signed)
Pt presents for AEX- Last  PAP-03/06/20 ?Last Mammo 11/19/21 ?Wants to discuss vaginal bleeding, no longer taking megace. ?Declines STD testing and blood work. ? ?

## 2022-04-20 NOTE — Progress Notes (Signed)
? ?Subjective:  ?  ? Erica [name] is a 51 y.o. female here at [facility] for a routine exam.  Current complaints: menses lighter recently, pt skipped 1 months, which is unusual for her.  Personal health questionnaire reviewed: yes. ? ?Do you have a primary care provider? yes ?Do you feel safe at home? yes ? ?Flowsheet Row Nutrition from 04/06/2017 in [facility]  ?PHQ-2 Total Score 0  ? ?  ? ? ?Health Maintenance Due  ?Topic Date Due  ? HEMOGLOBIN A1C  Never done  ? COVID-19 Vaccine (1) Never done  ? OPHTHALMOLOGY EXAM  Never done  ? HIV Screening  Never done  ? Hepatitis C Screening  Never done  ? COLONOSCOPY (Pts 45-72yrs Insurance coverage will need to be confirmed)  Never done  ?  ? ?Risk factors for chronic health problems: ?Smoking: ?Alchohol/how much: ?Pt BMI: Body mass index is 40.55 kg/m?. ?  ?Gynecologic History ?No LMP recorded (lmp unknown). Patient is postmenopausal. ?Contraception: abstinence ?Last Pap: 03/06/20. Results were: normal ?Last mammogram: 11/19/21. Results were: normal ? ?Obstetric History ?OB History  ?Gravida Para Term Preterm AB Living  ?1         1  ?SAB IAB Ectopic Multiple Live Births  ?        1  ?  ?# Outcome Date GA Lbr Len/2nd Weight Sex Delivery Anes PTL Lv  ?1 Gravida 05/16/96    F CS-LTranv   LIV  ? ? ? ?The following portions of the patient's history were reviewed and updated as appropriate: allergies, current medications, past family history, past medical history, past social history, past surgical history, and problem list. ? ?Review of Systems ?Pertinent items noted in HPI and remainder of comprehensive ROS otherwise negative.  ?  ?Objective:  ? ?BP (!) 145/86   Pulse 74   Wt 299 lb (135.6 kg)   LMP  (LMP Unknown)   BMI 40.55 kg/m?  ?VS reviewed, nursing note reviewed,  ?Constitutional: well developed, well nourished, no distress ?HEENT: normocephalic ?CV: normal rate ?Pulm/chest wall: normal effort ?Breast Exam:  Deferred with low risks and  shared decision making, reviewed mammogram results ?Abdomen: soft ?Neuro: alert and oriented x 3 ?Skin: warm, dry ?Psych: affect normal ?Pelvic exam: Performed:  Bimanual exam: Cervix 0/long/high, firm, anterior, neg CMT, uterus nontender, slightly enlarged, adnexa without tenderness, enlargement, or mass  ? ? ?   ?Assessment/Plan:  ? ?1. Encounter for well woman exam with routine gynecological exam ?--Initially scheduled for Pap. Reviewed current guidelines, hx of normal Paps.   ?--Pap Q 5 years recommended, pt may prefer Q 3 but agrees to postpone and not perform annually ? ?2. Abnormal uterine bleeding (AUB) ?--Pt with hx heavy regular bleeding.  Normal endometrial biopsy in 2022.  Had IUD in 2021 that was malpositioned and removed.   ?--Discussed options, including expectant management as perimenopause may bring lighter, less frequent menses but heavy menses may return. Also discussed continuing Megace, IUD reinsertion, and surgical options. ?--Pt would like to try IUD again, with premedications.  If not successful, will continue Megace until menopause. ? ?--Appt for IUD insertion, premedications sent to pharmacy, pt to take 2 hours before office appt. ? ?- cyclobenzaprine (FLEXERIL) 10 MG tablet; Take 1 tablet 2 hours before your office visit  Dispense: 1 tablet; Refill: 0 ?- misoprostol (CYTOTEC) 200 MCG tablet; Place two tablets in your cheek and let them soften, then swallow, around 2 hours before your office appointment.  Dispense:  2 tablet; Refill: 0 ? ?3. Hyperpigmentation of skin ?--Darkening skin of inner thighs ?--Try collagen, both topical and oral options ?--Sent teaching about home remedies via MyChart, and professional treatments, offered dermatology referral if pt desires, pt to notify office if she desires referral.  ? ? ? ?Return in about 1 month (around 05/21/2022) for IUD insertion with me.  ? ?Sharen Counter, CNM ?5:36 PM   ?

## 2022-05-25 ENCOUNTER — Ambulatory Visit: Payer: BC Managed Care – PPO | Admitting: Advanced Practice Midwife

## 2022-06-10 ENCOUNTER — Ambulatory Visit: Payer: BC Managed Care – PPO | Admitting: Podiatry

## 2022-09-11 ENCOUNTER — Other Ambulatory Visit: Payer: Self-pay | Admitting: Family Medicine

## 2022-09-11 DIAGNOSIS — Z1231 Encounter for screening mammogram for malignant neoplasm of breast: Secondary | ICD-10-CM

## 2022-09-16 ENCOUNTER — Ambulatory Visit (INDEPENDENT_AMBULATORY_CARE_PROVIDER_SITE_OTHER): Payer: BC Managed Care – PPO | Admitting: Podiatry

## 2022-09-16 DIAGNOSIS — Z91199 Patient's noncompliance with other medical treatment and regimen due to unspecified reason: Secondary | ICD-10-CM

## 2022-09-16 NOTE — Progress Notes (Signed)
1. No-show for appointment     

## 2022-09-29 ENCOUNTER — Ambulatory Visit (INDEPENDENT_AMBULATORY_CARE_PROVIDER_SITE_OTHER): Payer: BC Managed Care – PPO | Admitting: Obstetrics and Gynecology

## 2022-09-29 ENCOUNTER — Encounter: Payer: Self-pay | Admitting: Obstetrics and Gynecology

## 2022-09-29 VITALS — BP 158/94 | HR 93 | Ht 72.0 in | Wt 279.0 lb

## 2022-09-29 DIAGNOSIS — Z3042 Encounter for surveillance of injectable contraceptive: Secondary | ICD-10-CM | POA: Diagnosis not present

## 2022-09-29 LAB — POCT URINE PREGNANCY: Preg Test, Ur: NEGATIVE

## 2022-09-29 MED ORDER — MEDROXYPROGESTERONE ACETATE 150 MG/ML IM SUSP
150.0000 mg | Freq: Once | INTRAMUSCULAR | Status: AC
  Administered 2022-09-29: 150 mg via INTRAMUSCULAR

## 2022-09-29 MED ORDER — MEDROXYPROGESTERONE ACETATE 150 MG/ML IM SUSY
150.0000 mg | PREFILLED_SYRINGE | INTRAMUSCULAR | 5 refills | Status: DC
  Filled 2023-03-18: qty 1, 90d supply, fill #0
  Filled 2023-09-02: qty 1, 90d supply, fill #1

## 2022-09-29 NOTE — Progress Notes (Signed)
51 yo with menorrhagia with regular cycle returning for hormonal management. Patient is no longer interested in Levonorgestrel IUD as previously discussed. She desires to use depo-provera which she has used in the past. Patient is without any other complaints. She denies pelvic pain or abnormal discharge  Past Medical History:  Diagnosis Date   Arthritis    Depression    Diabetes mellitus    Fatty liver    Hernia, abdominal    Hypercholesteremia    IBS (irritable bowel syndrome)    Past Surgical History:  Procedure Laterality Date   CESAREAN SECTION     CHOLECYSTECTOMY     Family History  Problem Relation Age of Onset   Hypertension Mother    Heart failure Mother    Fibroids Mother    Fibroids Sister    Fibroids Maternal Grandmother    Diabetes type II Paternal Grandmother    Social History   Tobacco Use   Smoking status: Never   Smokeless tobacco: Never  Vaping Use   Vaping Use: Never used  Substance Use Topics   Alcohol use: No   Drug use: No   ROS See pertinent in HPI. All other systems reviewed and non contributory Blood pressure (!) 158/94, pulse 93, height 6' (1.829 m), weight 279 lb (126.6 kg), last menstrual period 09/18/2022.  GENERAL: Well-developed, well-nourished female in no acute distress.  NEURO: alert and oriented x 3  01/2021 Endometrial biopsy - Negative for malignancy  FINDINGS: Uterus   Measurements: 10.9 x 6.5 x 7.0 cm = volume: 256.1 mL. Uterus is anteverted and somewhat enlarged with globular morphology. No discrete fibroid or other mass.   Endometrium   Thickness: 8.7 mm. Ill definition and poorly visualized endometrial-myometrial interface, which could reflect underlying adenomyosis. No other focal abnormality.   Right ovary   Not visualized.  No adnexal mass.   Left ovary   Measurements: 3.1 x 1.7 x 1.3 cm = volume: 3.6 mL. Normal appearance/no adnexal mass.   Other findings   No abnormal free fluid.   IMPRESSION: 1.  Endometrial stripe measures within normal limits at 8.7 mm in thickness on today's exam. 2. Enlarged globular uterus with poor endometrial-myometrial interface, suggesting possible adenomyosis. No discrete fibroid or other uterine mass. 3. Normal sonographic appearance of the left ovary, with nonvisualization of the right ovary. No adnexal mass or free fluid within the pelvis.     Electronically Signed   By: Jeannine Boga M.D.   On: 01/21/2021 17:10    A/P 51 yo with menorrhagia with regular cycles - Reviewed different hormonal options - Patient opted for depo-provera - Patient is current on pap smear and mammogram - Follow up with PCP as scheduled for HTN management

## 2022-09-29 NOTE — Progress Notes (Signed)
Reports periods different for the last 2 years. Wants depo. Has tried IUD with difficult insertion.

## 2022-11-20 ENCOUNTER — Ambulatory Visit: Payer: BC Managed Care – PPO

## 2022-12-25 ENCOUNTER — Ambulatory Visit
Admission: RE | Admit: 2022-12-25 | Discharge: 2022-12-25 | Disposition: A | Discharge status: Home / Self Care | Payer: BC Managed Care – PPO | Source: Ambulatory Visit | Attending: Family Medicine | Admitting: Family Medicine

## 2022-12-25 DIAGNOSIS — Z1231 Encounter for screening mammogram for malignant neoplasm of breast: Secondary | ICD-10-CM

## 2022-12-28 ENCOUNTER — Ambulatory Visit (INDEPENDENT_AMBULATORY_CARE_PROVIDER_SITE_OTHER): Payer: BC Managed Care – PPO | Admitting: Emergency Medicine

## 2022-12-28 VITALS — BP 137/87 | HR 99 | Wt 267.1 lb

## 2022-12-28 DIAGNOSIS — Z3042 Encounter for surveillance of injectable contraceptive: Secondary | ICD-10-CM | POA: Diagnosis not present

## 2022-12-28 MED ORDER — MEDROXYPROGESTERONE ACETATE 150 MG/ML IM SUSP
150.0000 mg | Freq: Once | INTRAMUSCULAR | Status: AC
  Administered 2022-12-28: 150 mg via INTRAMUSCULAR

## 2022-12-28 NOTE — Progress Notes (Signed)
Date last pap: 03/06/2020. Last Depo-Provera: 09/29/2022. Side Effects if any: None. Serum HCG indicated? NA. Depo-Provera 150 mg IM given by: Corinna Lines, RN into RIGHT deltoid. Next appointment due April 2-April 16.

## 2023-01-18 ENCOUNTER — Ambulatory Visit: Payer: BC Managed Care – PPO

## 2023-02-08 ENCOUNTER — Other Ambulatory Visit: Payer: Self-pay

## 2023-02-08 ENCOUNTER — Ambulatory Visit (LOCAL_COMMUNITY_HEALTH_CENTER): Payer: Self-pay

## 2023-02-08 DIAGNOSIS — Z111 Encounter for screening for respiratory tuberculosis: Secondary | ICD-10-CM

## 2023-02-11 ENCOUNTER — Ambulatory Visit (LOCAL_COMMUNITY_HEALTH_CENTER): Payer: Self-pay

## 2023-02-11 ENCOUNTER — Other Ambulatory Visit: Payer: Self-pay

## 2023-02-11 DIAGNOSIS — Z111 Encounter for screening for respiratory tuberculosis: Secondary | ICD-10-CM

## 2023-02-11 LAB — TB SKIN TEST
Induration: 0 mm
TB Skin Test: NEGATIVE

## 2023-02-12 ENCOUNTER — Telehealth: Payer: Self-pay | Admitting: Physician Assistant

## 2023-02-12 ENCOUNTER — Ambulatory Visit: Payer: Self-pay

## 2023-02-12 ENCOUNTER — Other Ambulatory Visit: Payer: Self-pay

## 2023-02-12 DIAGNOSIS — E1165 Type 2 diabetes mellitus with hyperglycemia: Secondary | ICD-10-CM

## 2023-02-12 MED ORDER — FLUOCINOLONE ACETONIDE BODY 0.01 % EX OIL: TOPICAL_OIL | CUTANEOUS | 1 refills | Status: DC

## 2023-02-12 MED ORDER — OZEMPIC (1 MG/DOSE) 4 MG/3ML ~~LOC~~ SOPN
1.0000 mg | PEN_INJECTOR | SUBCUTANEOUS | 1 refills | Status: DC
  Filled 2023-03-23: qty 9, 84d supply, fill #0

## 2023-02-12 MED ORDER — ATORVASTATIN CALCIUM 40 MG PO TABS
40.0000 mg | ORAL_TABLET | Freq: Every day | ORAL | 3 refills | Status: DC
  Filled 2023-03-18: qty 90, 90d supply, fill #0
  Filled 2023-09-02: qty 90, 90d supply, fill #1

## 2023-02-12 MED ORDER — METFORMIN HCL 1000 MG PO TABS: 1000.0000 mg | ORAL_TABLET | Freq: Two times a day (BID) | ORAL | 5 refills | Status: DC

## 2023-02-12 MED ORDER — LISINOPRIL 5 MG PO TABS
5.0000 mg | ORAL_TABLET | Freq: Every day | ORAL | 5 refills | Status: DC
  Filled 2023-03-18: qty 30, 30d supply, fill #0

## 2023-02-12 MED ORDER — ESCITALOPRAM OXALATE 20 MG PO TABS
20.0000 mg | ORAL_TABLET | Freq: Every day | ORAL | 3 refills | Status: DC
  Filled 2023-02-12 – 2023-03-18 (×2): qty 90, 90d supply, fill #0

## 2023-02-12 MED ORDER — LOPERAMIDE HCL 2 MG PO CAPS: 2.0000 mg | ORAL_CAPSULE | Freq: Four times a day (QID) | ORAL | 0 refills | Status: DC | PRN

## 2023-02-12 MED ORDER — PREDNISOLONE ACETATE 1 % OP SUSP: OPHTHALMIC | 0 refills | Status: DC

## 2023-02-12 MED ORDER — FLUOCINOLONE ACETONIDE BODY 0.01 % EX OIL
TOPICAL_OIL | CUTANEOUS | 1 refills | Status: DC
  Filled 2023-03-18: qty 236.56, 90d supply, fill #0

## 2023-02-12 MED ORDER — CHOLECALCIFEROL 1.25 MG (50000 UT) PO CAPS: 50000.0000 [IU] | ORAL_CAPSULE | ORAL | 0 refills | Status: DC

## 2023-02-12 MED ORDER — TRAZODONE HCL 100 MG PO TABS
100.0000 mg | ORAL_TABLET | Freq: Every day | ORAL | 11 refills | Status: DC
  Filled 2023-02-25: qty 30, 30d supply, fill #0

## 2023-02-12 MED ORDER — HYDROXYZINE HCL 25 MG PO TABS: 25.0000 mg | ORAL_TABLET | Freq: Three times a day (TID) | ORAL | 0 refills | Status: DC | PRN

## 2023-02-12 MED ORDER — OZEMPIC (1 MG/DOSE) 4 MG/3ML ~~LOC~~ SOPN
1.0000 mg | PEN_INJECTOR | SUBCUTANEOUS | 1 refills | Status: DC
  Filled 2023-02-12: qty 3, 28d supply, fill #0

## 2023-02-12 MED ORDER — BUPROPION HCL ER (XL) 150 MG PO TB24: 150.0000 mg | ORAL_TABLET | Freq: Every morning | ORAL | 0 refills | Status: DC

## 2023-02-12 MED ORDER — ONDANSETRON 4 MG PO TBDP: ORAL_TABLET | ORAL | 0 refills | Status: DC

## 2023-02-12 NOTE — Patient Instructions (Signed)
Curt Jews, thank you for joining Mar Daring, PA-C for today's virtual visit.  While this provider is not your primary care provider (PCP), if your PCP is located in our provider database this encounter information will be shared with them immediately following your visit.   [facility] account gives you access to today's visit and all your visits, tests, and labs performed at [facility] " click here if you don't have a [facility] account or go to [email or web address]  Consent: (Patient) Erica [name] provided verbal consent for this virtual visit at the beginning of the encounter.  Current Medications:  Current Outpatient Medications:    ACCU-CHEK GUIDE test strip, USE AS DIRECTED TO CHECK BLOOD SUGAR 4 TIMES DAILY, Disp: , Rfl:    Accu-Chek Softclix Lancets lancets, 4 (four) times daily., Disp: , Rfl:    aspirin 81 MG tablet, Take 81 mg by mouth at bedtime. , Disp: , Rfl:    atorvastatin (LIPITOR) 20 MG tablet, Take 1 tablet by mouth daily., Disp: , Rfl:    atorvastatin (LIPITOR) 40 MG tablet, Take 1 tablet (40 mg total) by mouth daily., Disp: 90 tablet, Rfl: 3   blood glucose meter kit and supplies KIT, Dispense based on patient and insurance preference. Use up to four times daily as directed., Disp: 1 each, Rfl: 0   buPROPion (WELLBUTRIN XL) 150 MG 24 hr tablet, Take 1 tablet (150 mg total) by mouth every morning., Disp: 30 tablet, Rfl: 0   busPIRone (BUSPAR) 7.5 MG tablet, buspirone 7.5 mg tablet (Patient not taking: Reported on 09/29/2022), Disp: , Rfl:    Cholecalciferol 1.25 MG (50000 UT) capsule, Take 1 capsule (50,000 Units total) by mouth once a week., Disp: 12 capsule, Rfl: 0   cyclobenzaprine (FLEXERIL) 10 MG tablet, Take 1 tablet 2 hours before your office visit (Patient not taking: Reported on 09/29/2022), Disp: 1 tablet, Rfl: 0   doxycycline (VIBRA-TABS) 100 MG tablet, Take 1 tablet (100 mg total) by mouth 2 (two) times daily.  (Patient not taking: Reported on 02/08/2023), Disp: 20 tablet, Rfl: 0   doxycycline (VIBRA-TABS) 100 MG tablet, Take 1 tablet (100 mg total) by mouth 2 (two) times daily. (Patient not taking: Reported on 02/08/2023), Disp: 20 tablet, Rfl: 0   ergocalciferol (VITAMIN D2) 1.25 MG (50000 UT) capsule, Take 1 capsule by mouth once a week., Disp: , Rfl:    escitalopram (LEXAPRO) 10 MG tablet, Take by mouth., Disp: , Rfl:    escitalopram (LEXAPRO) 20 MG tablet, Take 1 tablet (20 mg total) by mouth daily., Disp: 90 tablet, Rfl: 3   fluconazole (DIFLUCAN) 200 MG tablet, , Disp: , Rfl:    Fluocinolone Acetonide Body 0.01 % OIL, Aply to the scalp 2 times daily for 2 weeks, then once daily for 2 weeks, then as needed thereafter., Disp: 118.28 mL, Rfl: 1   Fluocinolone Acetonide Body 0.01 % OIL, Apply to scalp 2 times daily for 2 weeks, then once daily for 2 weeks, then as needed thereafter., Disp: 240 mL, Rfl: 1   GNP ULTICARE PEN NEEDLES 32G X 4 MM MISC, , Disp: , Rfl:    HYDROcodone-acetaminophen (NORCO/VICODIN) 5-325 MG tablet, Take 1-2 tablets by mouth every 6 (six) hours as needed for severe pain., Disp: 8 tablet, Rfl: 0   hydrOXYzine (ATARAX) 25 MG tablet, Take 1 tablet (25 mg total) by mouth 3 (three) times daily as needed for itching for up to 10 days., Disp: 30 tablet, Rfl: 0  Insulin Pen Needle (GNP ULTICARE PEN NEEDLES) 32G X 4 MM MISC, , Disp: , Rfl:    lisinopril (ZESTRIL) 5 MG tablet, Take 1 tablet by mouth daily., Disp: , Rfl:    lisinopril (ZESTRIL) 5 MG tablet, Take 1 tablet (5 mg total) by mouth daily., Disp: 30 tablet, Rfl: 5   loperamide (IMODIUM) 2 MG capsule, Take 1 capsule (2 mg total) by mouth 4 (four) times daily as needed for diarrhea for up to 10 days., Disp: 30 capsule, Rfl: 0   medroxyPROGESTERone Acetate 150 MG/ML SUSY, Inject 1 mL (150 mg total) into the muscle every 3 (three) months., Disp: 1 mL, Rfl: 5   megestrol (MEGACE) 40 MG tablet, Take 1 tablet (40 mg total) by mouth 2 (two)  times daily. (Patient not taking: Reported on 02/08/2023), Disp: 60 tablet, Rfl: 1   metFORMIN (GLUCOPHAGE) 1000 MG tablet, Take by mouth. (Patient not taking: Reported on 12/10/2020), Disp: , Rfl:    metFORMIN (GLUCOPHAGE) 1000 MG tablet, Take 1 tablet by mouth 2 (two) times daily., Disp: , Rfl:    metFORMIN (GLUCOPHAGE) 1000 MG tablet, Take 1 tablet (1,000 mg total) by mouth 2 (two) times daily with meals., Disp: 60 tablet, Rfl: 5   misoprostol (CYTOTEC) 200 MCG tablet, Place two tablets in your cheek and let them soften, then swallow, around 2 hours before your office appointment. (Patient not taking: Reported on 09/29/2022), Disp: 2 tablet, Rfl: 0   mupirocin ointment (BACTROBAN) 2 %, Apply 1 application topically 2 (two) times daily. (Patient not taking: Reported on 04/20/2022), Disp: 30 g, Rfl: 2   naproxen (NAPROSYN) 500 MG tablet, Take 1 tablet (500 mg total) by mouth 2 (two) times daily. (Patient not taking: Reported on 04/20/2022), Disp: 30 tablet, Rfl: 0   ondansetron (ZOFRAN) 8 MG tablet, SMARTSIG:1 Tablet(s) By Mouth Every 12 Hours PRN (Patient not taking: Reported on 04/20/2022), Disp: , Rfl:    ondansetron (ZOFRAN-ODT) 4 MG disintegrating tablet, Dissolve 1 tablet by mouth every 8 hours as needed for nausea for up to 7 days., Disp: 20 tablet, Rfl: 0   prednisoLONE acetate (PRED FORTE) 1 % ophthalmic suspension, Instill 1 drop into the right eye as directed: 1 drop 4 times daily for 1 week, then 1 drop 2 times daily for 1 week, then 1 drop daily for 1 week., Disp: 10 mL, Rfl: 0   pregabalin (LYRICA) 150 MG capsule, Take 1 capsule by mouth 2 (two) times daily., Disp: , Rfl:    Semaglutide, 1 MG/DOSE, (OZEMPIC, 1 MG/DOSE,) 4 MG/3ML SOPN, Inject 1 mg into the skin once a week., Disp: 9 mL, Rfl: 1   terbinafine (LAMISIL) 250 MG tablet, Take 1 tablet (250 mg total) by mouth daily. (Patient not taking: Reported on 04/20/2022), Disp: 90 tablet, Rfl: 0   traZODone (DESYREL) 100 MG tablet, Take by mouth.,  Disp: , Rfl:    traZODone (DESYREL) 100 MG tablet, Take 1 tablet (100 mg total) by mouth at bedtime for sleep., Disp: 30 tablet, Rfl: 11   Medications ordered in this encounter:  Meds ordered this encounter  Medications   Semaglutide, 1 MG/DOSE, (OZEMPIC, 1 MG/DOSE,) 4 MG/3ML SOPN    Sig: Inject 1 mg into the skin once a week.    Dispense:  9 mL    Refill:  1    Order Specific Question:   Supervising Provider    Answer:   Chase Picket [id number]     *If you need refills on other medications prior  to your next appointment, please contact your pharmacy*  Follow-Up: Call back or seek an in-person evaluation if the symptoms worsen or if the condition fails to improve as anticipated.  [place] [phone]  Other Instructions  Semaglutide Injection What is this medication? SEMAGLUTIDE (SEM a GLOO tide) treats type 2 diabetes. It works by increasing insulin levels in your body, which decreases your blood sugar (glucose). It also reduces the amount of sugar released into the blood and slows down your digestion. It can also be used to lower the risk of heart attack and stroke in people with type 2 diabetes. Changes to diet and exercise are often combined with this medication. This medicine may be used for other purposes; ask your health care provider or pharmacist if you have questions. COMMON BRAND NAME(S): OZEMPIC What should I tell my care team before I take this medication? They need to know if you have any of these conditions: Endocrine tumors (MEN 2) or if someone in your family had these tumors Eye disease, vision problems History of pancreatitis Kidney disease Stomach problems Thyroid cancer or if someone in your family had thyroid cancer An unusual or allergic reaction to semaglutide, other medications, foods, dyes, or preservatives Pregnant or trying to get pregnant Breast-feeding How should I use this medication? This medication is for injection under  the skin of your upper leg (thigh), stomach area, or upper arm. It is given once every week (every 7 days). You will be taught how to prepare and give this medication. Use exactly as directed. Take your medication at regular intervals. Do not take it more often than directed. If you use this medication with insulin, you should inject this medication and the insulin separately. Do not mix them together. Do not give the injections right next to each other. Change (rotate) injection sites with each injection. It is important that you put your used needles and syringes in a special sharps container. Do not put them in a trash can. If you do not have a sharps container, call your pharmacist or care team to get one. A special MedGuide will be given to you by the pharmacist with each prescription and refill. Be sure to read this information carefully each time. This medication comes with INSTRUCTIONS FOR USE. Ask your pharmacist for directions on how to use this medication. Read the information carefully. Talk to your pharmacist or care team if you have questions. Talk to your care team about the use of this medication in children. Special care may be needed. Overdosage: If you think you have taken too much of this medicine contact a poison control center or emergency room at once. NOTE: This medicine is only for you. Do not share this medicine with others. What if I miss a dose? If you miss a dose, take it as soon as you can within 5 days after the missed dose. Then take your next dose at your regular weekly time. If it has been longer than 5 days after the missed dose, do not take the missed dose. Take the next dose at your regular time. Do not take double or extra doses. If you have questions about a missed dose, contact your care team for advice. What may interact with this medication? Other medications for diabetes Many medications may cause changes in blood sugar, these include: Alcohol containing  beverages Antiviral medications for HIV or AIDS Aspirin and aspirin-like medications Certain medications for blood pressure, heart disease, irregular heart beat Chromium  Diuretics Female hormones, such as estrogens or progestins, birth control pills Fenofibrate Gemfibrozil Isoniazid Lanreotide Female hormones or anabolic steroids MAOIs like Carbex, Eldepryl, Marplan, Nardil, and Parnate Medications for weight loss Medications for allergies, asthma, cold, or cough Medications for depression, anxiety, or psychotic disturbances Niacin Nicotine NSAIDs, medications for pain and inflammation, like ibuprofen or naproxen Octreotide Pasireotide Pentamidine Phenytoin Probenecid Quinolone antibiotics such as ciprofloxacin, levofloxacin, ofloxacin Some herbal dietary supplements Steroid medications such as prednisone or cortisone Sulfamethoxazole; trimethoprim Thyroid hormones Some medications can hide the warning symptoms of low blood sugar (hypoglycemia). You may need to monitor your blood sugar more closely if you are taking one of these medications. These include: Beta-blockers, often used for high blood pressure or heart problems (examples include atenolol, metoprolol, propranolol) Clonidine Guanethidine Reserpine This list may not describe all possible interactions. Give your health care provider a list of all the medicines, herbs, non-prescription drugs, or dietary supplements you use. Also tell them if you smoke, drink alcohol, or use illegal drugs. Some items may interact with your medicine. What should I watch for while using this medication? Visit your care team for regular checks on your progress. Drink plenty of fluids while taking this medication. Check with your care team if you get an attack of severe diarrhea, nausea, and vomiting. The loss of too much body fluid can make it dangerous for you to take this medication. A test called the HbA1C (A1C) will be monitored. This is a  simple blood test. It measures your blood sugar control over the last 2 to 3 months. You will receive this test every 3 to 6 months. Learn how to check your blood sugar. Learn the symptoms of low and high blood sugar and how to manage them. Always carry a quick-source of sugar with you in case you have symptoms of low blood sugar. Examples include hard sugar candy or glucose tablets. Make sure others know that you can choke if you eat or drink when you develop serious symptoms of low blood sugar, such as seizures or unconsciousness. They must get medical help at once. Tell your care team if you have high blood sugar. You might need to change the dose of your medication. If you are sick or exercising more than usual, you might need to change the dose of your medication. Do not skip meals. Ask your care team if you should avoid alcohol. Many nonprescription cough and cold products contain sugar or alcohol. These can affect blood sugar. Pens should never be shared. Even if the needle is changed, sharing may result in passing of viruses like hepatitis or HIV. Wear a medical ID bracelet or chain, and carry a card that describes your disease and details of your medication and dosage times. Do not become pregnant while taking this medication. Women should inform their care team if they wish to become pregnant or think they might be pregnant. There is a potential for serious side effects to an unborn child. Talk to your care team for more information. What side effects may I notice from receiving this medication? Side effects that you should report to your care team as soon as possible: Allergic reactions--skin rash, itching, hives, swelling of the face, lips, tongue, or throat Change in vision Dehydration--increased thirst, dry mouth, feeling faint or lightheaded, headache, dark yellow or brown urine Gallbladder problems--severe stomach pain, nausea, vomiting, fever Heart palpitations--rapid, pounding, or  irregular heartbeat Kidney injury--decrease in the amount of urine, swelling of the ankles, hands, or feet  Pancreatitis--severe stomach pain that spreads to your back or gets worse after eating or when touched, fever, nausea, vomiting Thyroid cancer--new mass or lump in the neck, pain or trouble swallowing, trouble breathing, hoarseness Side effects that usually do not require medical attention (report to your care team if they continue or are bothersome): Diarrhea Loss of appetite Nausea Stomach pain Vomiting This list may not describe all possible side effects. Call your doctor for medical advice about side effects. You may report side effects to FDA at 1-800-FDA-1088. Where should I keep my medication? Keep out of the reach of children. Store unopened pens in a refrigerator between 2 and 8 degrees C (36 and 46 degrees F). Do not freeze. Protect from light and heat. After you first use the pen, it can be stored for 56 days at room temperature between 15 and 30 degrees C (59 and 86 degrees F) or in a refrigerator. Throw away your used pen after 56 days or after the expiration date, whichever comes first. Do not store your pen with the needle attached. If the needle is left on, medication may leak from the pen. NOTE: This sheet is a summary. It may not cover all possible information. If you have questions about this medicine, talk to your doctor, pharmacist, or health care provider.  2023 Elsevier/Gold Standard (2021-03-06 00:00:00)    If you have been instructed to have an in-person evaluation today at a local Urgent Care facility, please use the link below. It will take you to a list of all of our available [facility], including address, phone number and hours of operation. Please do not delay care.  [facility]  If you or a family member do not have a primary care provider, use the link below to schedule a visit and establish care. When you choose a [facility]  primary care physician or advanced practice provider, you gain a long-term partner in health. Find a Primary Care Provider  Learn more about [facility]'s in-office and virtual care options: [facility]

## 2023-02-12 NOTE — Progress Notes (Signed)
Virtual Visit Consent   Erica [name], you are scheduled for a virtual visit with a [facility] provider today. Just as with appointments in the office, your consent must be obtained to participate. Your consent will be active for this visit and any virtual visit you may have with one of our providers in the next 365 days. If you have a MyChart account, a copy of this consent can be sent to you electronically.  As this is a virtual visit, video technology does not allow for your provider to perform a traditional examination. This may limit your provider's ability to fully assess your condition. If your provider identifies any concerns that need to be evaluated in person or the need to arrange testing (such as labs, EKG, etc.), we will make arrangements to do so. Although advances in technology are sophisticated, we cannot ensure that it will always work on either your end or our end. If the connection with a video visit is poor, the visit may have to be switched to a telephone visit. With either a video or telephone visit, we are not always able to ensure that we have a secure connection.  By engaging in this virtual visit, you consent to the provision of healthcare and authorize for your insurance to be billed (if applicable) for the services provided during this visit. Depending on your insurance coverage, you may receive a charge related to this service.  I need to obtain your verbal consent now. Are you willing to proceed with your visit today? Erica [name] has provided verbal consent on 02/12/2023 for a virtual visit (video or telephone). Mar Daring, PA-C  Date: 02/12/2023 1:38 PM  Virtual Visit via Video Note   I, Mar Daring, connected with  Erica [name]  ([id number], 1971/06/13) on 02/12/23 at  1:30 PM EST by a video-enabled telemedicine application and verified that I am speaking with the correct person using two identifiers.  Location: Patient: Virtual Visit Location Patient:  Home Provider: Virtual Visit Location Provider: Home Office   I discussed the limitations of evaluation and management by telemedicine and the availability of in person appointments. The patient expressed understanding and agreed to proceed.    History of Present Illness: Erica [name] is a 52 y.o. who identifies as a female who was assigned female at birth, and is being seen today for medication refill. Recently lost her job and insurance. Unable to see her PCP at [facility] any longer. Has all other medications at this time, but has been without Ozempic for her T2DM over the last 3 weeks. Has appt with mobile clinic next week to follow up everything else in person. Lost her husband 2.5 years ago. Just has been having a hard time since he passed.    Problems:  Patient Active Problem List   Diagnosis Date Noted   Abnormal uterine bleeding (AUB) 04/20/2022   Hyperpigmentation of skin 04/20/2022   Puncture wound of right foot 02/08/2020   Sinusitis 10/31/2019   Urine finding 10/31/2019   Microalbuminuria 05/27/2018   Hyperlipidemia 05/24/2017   Inflammatory disorder of digestive tract 05/24/2017   Low back pain 05/24/2017   Type 2 diabetes mellitus with neurologic complication, without long-term current use of insulin ([place]) 05/24/2017   Vitamin D deficiency 05/24/2017   Otalgia of both ears 06/09/2016   Temporomandibular joint (TMJ) pain 06/09/2016   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 , Disp: , Rfl:    atorvastatin (LIPITOR) 20 MG tablet, Take 1 tablet by mouth daily., Disp: , Rfl:    atorvastatin  (LIPITOR) 40 MG tablet, Take 1 tablet (40 mg total) by mouth daily., Disp: 90 tablet, Rfl: 3   blood glucose meter kit and supplies KIT, Dispense based on patient and insurance preference. Use up to four times daily as directed., Disp: 1 each, Rfl: 0   buPROPion (WELLBUTRIN XL) 150 MG 24 hr tablet, Take 1 tablet (150 mg total) by mouth every morning., Disp: 30 tablet, Rfl: 0   busPIRone (BUSPAR) 7.5 MG tablet, buspirone 7.5 mg tablet (Patient not taking: Reported on 09/29/2022), Disp: , Rfl:    Cholecalciferol 1.25 MG (50000 UT) capsule, Take 1 capsule (50,000 Units total) by mouth once a week., Disp: 12 capsule, Rfl: 0   cyclobenzaprine (FLEXERIL) 10 MG tablet, Take 1 tablet 2 hours before your office visit (Patient not taking: Reported on 09/29/2022), Disp: 1 tablet, Rfl: 0   doxycycline (VIBRA-TABS) 100 MG tablet, Take 1 tablet (100 mg total) by mouth 2 (two) times daily. (Patient not taking: Reported on 02/08/2023), Disp: 20 tablet, Rfl: 0   doxycycline (VIBRA-TABS) 100 MG tablet, Take 1 tablet (100 mg total) by mouth 2 (two) times daily. (Patient not taking: Reported on 02/08/2023), Disp: 20 tablet, Rfl: 0   ergocalciferol (VITAMIN D2) 1.25 MG (50000 UT) capsule, Take 1 capsule by mouth once a week., Disp: , Rfl:    escitalopram (LEXAPRO) 10 MG tablet, Take by mouth., Disp: , Rfl:    escitalopram (LEXAPRO) 20 MG tablet, Take 1 tablet (20 mg total) by mouth daily., Disp: 90 tablet, Rfl: 3   fluconazole (DIFLUCAN) 200 MG tablet, , Disp: , Rfl:    Fluocinolone Acetonide Body 0.01 % OIL, Aply to the scalp 2 times daily for 2 weeks, then once daily for 2 weeks, then as needed thereafter., Disp: 118.28 mL, Rfl: 1   Fluocinolone Acetonide Body 0.01 % OIL, Apply to scalp 2 times daily for 2 weeks, then once daily for 2 weeks, then as needed thereafter., Disp: 240 mL, Rfl: 1   GNP ULTICARE PEN NEEDLES 32G X 4 MM MISC, , Disp: , Rfl:    HYDROcodone-acetaminophen (NORCO/VICODIN) 5-325 MG tablet, Take 1-2  tablets by mouth every 6 (six) hours as needed for severe pain., Disp: 8 tablet, Rfl: 0   hydrOXYzine (ATARAX) 25 MG tablet, Take 1 tablet (25 mg total) by mouth 3 (three) times daily as needed for itching for up to 10 days., Disp: 30 tablet, Rfl: 0   Insulin Pen Needle (GNP ULTICARE PEN NEEDLES) 32G X 4 MM MISC, , Disp: , Rfl:    lisinopril (ZESTRIL) 5 MG tablet, Take 1 tablet by mouth daily., Disp: , Rfl:    lisinopril (ZESTRIL) 5 MG tablet, Take 1 tablet (5 mg total) by mouth daily., Disp: 30 tablet, Rfl: 5   loperamide (IMODIUM) 2 MG capsule, Take 1 capsule (2 mg total) by mouth 4 (four) times daily as needed for diarrhea for up to 10 days., Disp: 30 capsule, Rfl: 0   medroxyPROGESTERone Acetate 150 MG/ML SUSY, Inject 1 mL (150 mg  total) into the muscle every 3 (three) months., Disp: 1 mL, Rfl: 5   megestrol (MEGACE) 40 MG tablet, Take 1 tablet (40 mg total) by mouth 2 (two) times daily. (Patient not taking: Reported on 02/08/2023), Disp: 60 tablet, Rfl: 1   metFORMIN (GLUCOPHAGE) 1000 MG tablet, Take by mouth. (Patient not taking: Reported on 12/10/2020), Disp: , Rfl:    metFORMIN (GLUCOPHAGE) 1000 MG tablet, Take 1 tablet by mouth 2 (two) times daily., Disp: , Rfl:    metFORMIN (GLUCOPHAGE) 1000 MG tablet, Take 1 tablet (1,000 mg total) by mouth 2 (two) times daily with meals., Disp: 60 tablet, Rfl: 5   misoprostol (CYTOTEC) 200 MCG tablet, Place two tablets in your cheek and let them soften, then swallow, around 2 hours before your office appointment. (Patient not taking: Reported on 09/29/2022), Disp: 2 tablet, Rfl: 0   mupirocin ointment (BACTROBAN) 2 %, Apply 1 application topically 2 (two) times daily. (Patient not taking: Reported on 04/20/2022), Disp: 30 g, Rfl: 2   naproxen (NAPROSYN) 500 MG tablet, Take 1 tablet (500 mg total) by mouth 2 (two) times daily. (Patient not taking: Reported on 04/20/2022), Disp: 30 tablet, Rfl: 0   ondansetron (ZOFRAN) 8 MG tablet, SMARTSIG:1 Tablet(s) By Mouth  Every 12 Hours PRN (Patient not taking: Reported on 04/20/2022), Disp: , Rfl:    ondansetron (ZOFRAN-ODT) 4 MG disintegrating tablet, Dissolve 1 tablet by mouth every 8 hours as needed for nausea for up to 7 days., Disp: 20 tablet, Rfl: 0   prednisoLONE acetate (PRED FORTE) 1 % ophthalmic suspension, Instill 1 drop into the right eye as directed: 1 drop 4 times daily for 1 week, then 1 drop 2 times daily for 1 week, then 1 drop daily for 1 week., Disp: 10 mL, Rfl: 0   pregabalin (LYRICA) 150 MG capsule, Take 1 capsule by mouth 2 (two) times daily., Disp: , Rfl:    Semaglutide, 1 MG/DOSE, (OZEMPIC, 1 MG/DOSE,) 4 MG/3ML SOPN, Inject 1 mg into the skin once a week., Disp: 9 mL, Rfl: 1   terbinafine (LAMISIL) 250 MG tablet, Take 1 tablet (250 mg total) by mouth daily. (Patient not taking: Reported on 04/20/2022), Disp: 90 tablet, Rfl: 0   traZODone (DESYREL) 100 MG tablet, Take by mouth., Disp: , Rfl:    traZODone (DESYREL) 100 MG tablet, Take 1 tablet (100 mg total) by mouth at bedtime for sleep., Disp: 30 tablet, Rfl: 11  Observations/Objective: Patient is well-developed, well-nourished in no acute distress.  Resting comfortably Head is normocephalic, atraumatic.  No labored breathing.  Speech is clear and coherent with logical content.  Patient is alert and oriented at baseline.    Assessment and Plan: 1. Uncontrolled type 2 diabetes mellitus with hyperglycemia (HCC) - Semaglutide, 1 MG/DOSE, (OZEMPIC, 1 MG/DOSE,) 4 MG/3ML SOPN; Inject 1 mg into the skin once a week.  Dispense: 9 mL; Refill: 1  - refilled medication to community pharmacy - follow up at mobile unit clinic next week  Follow Up Instructions: I discussed the assessment and treatment plan with the patient. The patient was provided an opportunity to ask questions and all were answered. The patient agreed with the plan and demonstrated an understanding of the instructions.  A copy of instructions were sent to the patient via MyChart  unless otherwise noted below.    The patient was advised to call back or seek an in-person evaluation if the symptoms worsen or if the condition fails to improve as anticipated.  Time:  I spent  10 minutes with the patient via telehealth technology discussing the above problems/concerns.    Mar Daring, PA-C

## 2023-02-12 NOTE — Telephone Encounter (Signed)
  Chief Complaint: medication refill  Symptoms: BS > 200 Frequency: 3-4 weeks  Pertinent Negatives: NA Disposition: '[]'$ ED /'[]'$ Urgent Care (no appt availability in office) / '[]'$ Appointment(In office/virtual)/ '[x]'$  [facility]/ '[]'$ Home Care/ '[]'$ Refused Recommended Disposition /'[x]'$ [facility]/ '[]'$  Follow-up with PCP Additional Notes: pt states she has lost her job as of 01/13/23 and has no insurance and is needing Ozempic d/t being out for 3 weeks and BS elevated. Pt unable to go back to [facility] PCP so advised pt she can do virtual UC visit via Mychart for Ozempic refill and then [facility] to see about getting other medications refilled prior to [place] [facility]. Pt verbalized understanding. Scheduled virtual UC for 1330 today and provided pt with location details for [facility] on 02/16/23.   Summary: Missing Insulin for 3 weeks   Pt called reporting that she has been out of her insulin for 3 weeks. Says she lost her job and her insurance and cannot return to her PCP in [place], she just scheduled a new patient appt with [place].  Best contact: [phone]         Reason for Disposition  [1] Caller has URGENT medicine question about med that PCP or specialist prescribed AND [2] triager unable to answer question  Answer Assessment - Initial Assessment Questions 1. NAME of MEDICINE: "What medicine(s) are you calling about?"     Ozempic  2. QUESTION: "What is your question?" (e.g., double dose of medicine, side effect)     Needing filled and help with financial assistance  3. PRESCRIBER: "Who prescribed the medicine?" Reason: if prescribed by specialist, call should be referred to that group.     Previous PCP with [facility]  4. SYMPTOMS: "Do you have any symptoms?" If Yes, ask: "What symptoms are you having?"  "How bad are the symptoms (e.g., mild, moderate, severe)     BS have been > 200  Protocols used: Medication Question Call-A-AH

## 2023-02-15 ENCOUNTER — Other Ambulatory Visit: Payer: Self-pay

## 2023-02-16 ENCOUNTER — Ambulatory Visit: Payer: Self-pay | Admitting: Physician Assistant

## 2023-02-16 ENCOUNTER — Other Ambulatory Visit: Payer: Self-pay

## 2023-02-16 ENCOUNTER — Encounter: Payer: Self-pay | Admitting: Physician Assistant

## 2023-02-16 VITALS — BP 144/75 | HR 103 | Ht 72.0 in | Wt 265.0 lb

## 2023-02-16 DIAGNOSIS — E1165 Type 2 diabetes mellitus with hyperglycemia: Secondary | ICD-10-CM

## 2023-02-16 DIAGNOSIS — F332 Major depressive disorder, recurrent severe without psychotic features: Secondary | ICD-10-CM | POA: Insufficient documentation

## 2023-02-16 DIAGNOSIS — E785 Hyperlipidemia, unspecified: Secondary | ICD-10-CM

## 2023-02-16 DIAGNOSIS — Z6835 Body mass index (BMI) 35.0-35.9, adult: Secondary | ICD-10-CM

## 2023-02-16 DIAGNOSIS — F5104 Psychophysiologic insomnia: Secondary | ICD-10-CM | POA: Insufficient documentation

## 2023-02-16 DIAGNOSIS — R030 Elevated blood-pressure reading, without diagnosis of hypertension: Secondary | ICD-10-CM

## 2023-02-16 DIAGNOSIS — E559 Vitamin D deficiency, unspecified: Secondary | ICD-10-CM

## 2023-02-16 MED ORDER — PREGABALIN 150 MG PO CAPS
150.0000 mg | ORAL_CAPSULE | Freq: Two times a day (BID) | ORAL | 1 refills | Status: DC
  Filled 2023-02-16 – 2023-03-18 (×2): qty 60, 30d supply, fill #0
  Filled 2023-04-22 – 2023-06-15 (×3): qty 60, 30d supply, fill #1

## 2023-02-16 MED ORDER — TRUEPLUS LANCETS 28G MISC
1.0000 | Freq: Two times a day (BID) | 11 refills | Status: DC
  Filled 2023-02-16 – 2023-03-18 (×2): qty 100, 50d supply, fill #0

## 2023-02-16 MED ORDER — TRUE METRIX METER W/DEVICE KIT
1.0000 [IU] | PACK | Freq: Every day | 0 refills | Status: AC
  Filled 2023-02-16 – 2023-03-18 (×2): qty 1, 30d supply, fill #0

## 2023-02-16 MED ORDER — BUPROPION HCL ER (XL) 150 MG PO TB24
150.0000 mg | ORAL_TABLET | Freq: Every morning | ORAL | 1 refills | Status: DC
  Filled 2023-02-16 – 2023-03-18 (×2): qty 30, 30d supply, fill #0
  Filled 2023-04-22 – 2023-09-02 (×2): qty 30, 30d supply, fill #1

## 2023-02-16 MED ORDER — TRUE METRIX BLOOD GLUCOSE TEST VI STRP
1.0000 | ORAL_STRIP | Freq: Two times a day (BID) | 12 refills | Status: DC
Start: 2023-02-16 — End: 2023-08-03
  Filled 2023-02-16 – 2023-03-18 (×2): qty 100, 50d supply, fill #0

## 2023-02-16 NOTE — Progress Notes (Signed)
New Patient Office Visit  Subjective    Patient ID: Erica [name], female    DOB: 03/25/1971  Age: 52 y.o. MRN: [id number]  CC:  Chief Complaint  Patient presents with   Follow-up    Diabetes    HPI Erica [name] states that she recently lost her insurance when she lost her job at the end of January.  States that she is unable to return to her previous primary care provider and [facility] be [facility].  States that she was able to get her Ozempic refilled after having a virtual visit with [facility].  States that she was able to have the majority of her prescriptions transferred to [facility] as well.  States that she does not check her blood glucose or blood pressure at home, states that she does not currently have a working meter and does not have a blood pressure cuff.  States that she has not been out of medications other than the Ozempic which she was out of for a couple of weeks.  States that she has been taking vitamin D 50,000 units once weekly for the past year.  States that she does have feelings of depression, states that since losing her job it has been worse but continues to experience prolonged grief due to the loss of her spouse 2-1/2 years ago.  States that many days she only gets out of bed to use the bathroom does not feel like eating on those days.  States that she does have thoughts that she would be better off dead but adamantly denies any plans for self-harm.  States that she previously was going to start counseling but was unable to afford the co-pay.     02/16/2023    8:26 AM 04/06/2017    4:13 PM 04/09/2015    8:17 PM  Depression screen PHQ 2/9  Decreased Interest 3 0 0  Down, Depressed, Hopeless 3 0 0  PHQ - 2 Score 6 0 0  Altered sleeping 3    Tired, decreased energy 3    Change in appetite 2    Feeling bad or failure about yourself  3    Trouble  concentrating 3    Moving slowly or fidgety/restless 2    Suicidal thoughts 1    PHQ-9 Score 23        02/16/2023    8:26 AM  GAD 7 : Generalized Anxiety Score  Nervous, Anxious, on Edge 3  Control/stop worrying 3  Worry too much - different things 3  Trouble relaxing 3  Restless 2  Easily annoyed or irritable 1  Afraid - awful might happen 2  Total GAD 7 Score 17       Outpatient Encounter Medications as of 02/16/2023  Medication Sig   Accu-Chek Softclix Lancets lancets 4 (four) times daily.   aspirin 81 MG tablet Take 81 mg by mouth at bedtime.    atorvastatin (LIPITOR) 40 MG tablet Take 1 tablet (40 mg total) by mouth daily.   blood glucose meter kit and supplies KIT Dispense based on patient and insurance preference. Use up to four times daily as directed.   Blood Glucose Monitoring Suppl (TRUE METRIX METER) w/Device KIT use to check blood sugar daily   ergocalciferol (VITAMIN D2) 1.25 MG (50000 UT) capsule Take 1 capsule by mouth once a week.   escitalopram (LEXAPRO) 20 MG tablet Take 1 tablet (  20 mg total) by mouth daily.   Fluocinolone Acetonide Body 0.01 % OIL Apply to scalp 2 times daily for 2 weeks, then once daily for 2 weeks, then as needed thereafter.   glucose blood (TRUE METRIX BLOOD GLUCOSE TEST) test strip Use as instructed 2 (two) times daily.   lisinopril (ZESTRIL) 5 MG tablet Take 1 tablet (5 mg total) by mouth daily.   medroxyPROGESTERone Acetate 150 MG/ML SUSY Inject 1 mL (150 mg total) into the muscle every 3 (three) months.   metFORMIN (GLUCOPHAGE) 1000 MG tablet Take 1 tablet (1,000 mg total) by mouth 2 (two) times daily with meals.   Semaglutide, 1 MG/DOSE, (OZEMPIC, 1 MG/DOSE,) 4 MG/3ML SOPN Inject 1 mg into the skin once a week.   traZODone (DESYREL) 100 MG tablet Take 1 tablet (100 mg total) by mouth at bedtime for sleep.   TRUEplus Lancets 28G MISC Use as directed 2 (two) times daily at 8 am and 10 pm.   [DISCONTINUED] ACCU-CHEK GUIDE test strip USE AS  DIRECTED TO CHECK BLOOD SUGAR 4 TIMES DAILY   [DISCONTINUED] buPROPion (WELLBUTRIN XL) 150 MG 24 hr tablet Take 1 tablet (150 mg total) by mouth every morning.   [DISCONTINUED] busPIRone (BUSPAR) 7.5 MG tablet    [DISCONTINUED] Cholecalciferol 1.25 MG (50000 UT) capsule Take 1 capsule (50,000 Units total) by mouth once a week.   [DISCONTINUED] GNP ULTICARE PEN NEEDLES 32G X 4 MM MISC    [DISCONTINUED] hydrOXYzine (ATARAX) 25 MG tablet Take 1 tablet (25 mg total) by mouth 3 (three) times daily as needed for itching for up to 10 days.   [DISCONTINUED] pregabalin (LYRICA) 150 MG capsule Take 1 capsule by mouth 2 (two) times daily.   buPROPion (WELLBUTRIN XL) 150 MG 24 hr tablet Take 1 tablet (150 mg total) by mouth every morning.   pregabalin (LYRICA) 150 MG capsule Take 1 capsule (150 mg total) by mouth 2 (two) times daily.   [DISCONTINUED] amitriptyline (ELAVIL) 50 MG tablet amitriptyline 50 mg tablet  Take 1 tablet(s) every day by oral route as directed for 30 days.   [DISCONTINUED] atorvastatin (LIPITOR) 20 MG tablet Take 1 tablet by mouth daily. (Patient not taking: Reported on 02/16/2023)   [DISCONTINUED] cyclobenzaprine (FLEXERIL) 10 MG tablet Take 1 tablet 2 hours before your office visit (Patient not taking: Reported on 09/29/2022)   [DISCONTINUED] doxycycline (VIBRA-TABS) 100 MG tablet Take 1 tablet (100 mg total) by mouth 2 (two) times daily.   [DISCONTINUED] doxycycline (VIBRA-TABS) 100 MG tablet Take 1 tablet (100 mg total) by mouth 2 (two) times daily. (Patient not taking: Reported on 02/08/2023)   [DISCONTINUED] escitalopram (LEXAPRO) 10 MG tablet Take by mouth.   [DISCONTINUED] fluconazole (DIFLUCAN) 200 MG tablet  (Patient not taking: Reported on 02/08/2023)   [DISCONTINUED] Fluocinolone Acetonide Body 0.01 % OIL Aply to the scalp 2 times daily for 2 weeks, then once daily for 2 weeks, then as needed thereafter.   [DISCONTINUED] gabapentin (NEURONTIN) 300 MG capsule Take 300 mg by mouth.    [DISCONTINUED] HYDROcodone-acetaminophen (NORCO/VICODIN) 5-325 MG tablet Take 1-2 tablets by mouth every 6 (six) hours as needed for severe pain. (Patient not taking: Reported on 02/16/2023)   [DISCONTINUED] Insulin Pen Needle (GNP ULTICARE PEN NEEDLES) 32G X 4 MM MISC    [DISCONTINUED] lisinopril (ZESTRIL) 5 MG tablet Take 1 tablet by mouth daily.   [DISCONTINUED] loperamide (IMODIUM) 2 MG capsule Take 1 capsule (2 mg total) by mouth 4 (four) times daily as needed for diarrhea for up  to 10 days. (Patient not taking: Reported on 02/16/2023)   [DISCONTINUED] megestrol (MEGACE) 40 MG tablet Take 1 tablet (40 mg total) by mouth 2 (two) times daily. (Patient not taking: Reported on 02/16/2023)   [DISCONTINUED] metFORMIN (GLUCOPHAGE) 1000 MG tablet Take by mouth. (Patient not taking: Reported on 12/10/2020)   [DISCONTINUED] metFORMIN (GLUCOPHAGE) 1000 MG tablet Take 1 tablet by mouth 2 (two) times daily.   [DISCONTINUED] misoprostol (CYTOTEC) 200 MCG tablet Place two tablets in your cheek and let them soften, then swallow, around 2 hours before your office appointment. (Patient not taking: Reported on 09/29/2022)   [DISCONTINUED] mupirocin ointment (BACTROBAN) 2 % Apply 1 application topically 2 (two) times daily. (Patient not taking: Reported on 04/20/2022)   [DISCONTINUED] naproxen (NAPROSYN) 500 MG tablet Take 1 tablet (500 mg total) by mouth 2 (two) times daily. (Patient not taking: Reported on 04/20/2022)   [DISCONTINUED] ondansetron (ZOFRAN) 8 MG tablet SMARTSIG:1 Tablet(s) By Mouth Every 12 Hours PRN (Patient not taking: Reported on 04/20/2022)   [DISCONTINUED] ondansetron (ZOFRAN-ODT) 4 MG disintegrating tablet Dissolve 1 tablet by mouth every 8 hours as needed for nausea for up to 7 days. (Patient not taking: Reported on 02/16/2023)   [DISCONTINUED] prednisoLONE acetate (PRED FORTE) 1 % ophthalmic suspension Instill 1 drop into the right eye as directed: 1 drop 4 times daily for 1 week, then 1 drop 2 times  daily for 1 week, then 1 drop daily for 1 week. (Patient not taking: Reported on 02/16/2023)   [DISCONTINUED] terbinafine (LAMISIL) 250 MG tablet Take 1 tablet (250 mg total) by mouth daily. (Patient not taking: Reported on 04/20/2022)   [DISCONTINUED] traZODone (DESYREL) 100 MG tablet Take by mouth.   No facility-administered encounter medications on file as of 02/16/2023.    Past Medical History:  Diagnosis Date   Arthritis    Depression    Diabetes mellitus    Fatty liver    Hernia, abdominal    Hypercholesteremia    IBS (irritable bowel syndrome)     Past Surgical History:  Procedure Laterality Date   CESAREAN SECTION     CHOLECYSTECTOMY      Family History  Problem Relation Age of Onset   Hypertension Mother    Heart failure Mother    Fibroids Mother    Breast cancer Sister    Fibroids Sister    Fibroids Maternal Grandmother    Diabetes type II Paternal Grandmother     Social History   Socioeconomic History   Marital status: Married    Spouse name: Not on file   Number of children: Not on file   Years of education: Not on file   Highest education level: Not on file  Occupational History   Not on file  Tobacco Use   Smoking status: Never   Smokeless tobacco: Never  Vaping Use   Vaping Use: Never used  Substance and Sexual Activity   Alcohol use: No   Drug use: No   Sexual activity: Not Currently    Birth control/protection: None  Other Topics Concern   Not on file  Social History Narrative   Not on file   Social Determinants of Health   Financial Resource Strain: Not on file  Food Insecurity: Not on file  Transportation Needs: Not on file  Physical Activity: Not on file  Stress: Not on file  Social Connections: Not on file  Intimate Partner Violence: Not on file    Review of Systems  HENT: Negative.    Eyes:  Negative.   Respiratory:  Negative for shortness of breath.   Cardiovascular:  Negative for chest pain.  Gastrointestinal: Negative.    Genitourinary: Negative.   Musculoskeletal: Negative.   Skin: Negative.   Neurological: Negative.   Endo/Heme/Allergies: Negative.   Psychiatric/Behavioral:  Positive for depression. The patient is nervous/anxious. The patient does not have insomnia.         Objective    BP (!) 144/75 (BP Location: Left Arm, Patient Position: Sitting, Cuff Size: Large)   Pulse (!) 103   Ht 6' (1.829 m)   Wt 265 lb (120.2 kg)   SpO2 97%   BMI 35.94 kg/m   Physical Exam Vitals and nursing note reviewed.  Constitutional:      Appearance: Normal appearance.  HENT:     Head: Normocephalic and atraumatic.     Right Ear: External ear normal.     Left Ear: External ear normal.     Nose: Nose normal.     Mouth/Throat:     Mouth: Mucous membranes are moist.     Pharynx: Oropharynx is clear.  Eyes:     Extraocular Movements: Extraocular movements intact.     Conjunctiva/sclera: Conjunctivae normal.     Pupils: Pupils are equal, round, and reactive to light.  Cardiovascular:     Rate and Rhythm: Normal rate and regular rhythm.     Pulses: Normal pulses.     Heart sounds: Normal heart sounds.  Pulmonary:     Effort: Pulmonary effort is normal.     Breath sounds: Normal breath sounds.  Musculoskeletal:        General: Normal range of motion.     Cervical back: Normal range of motion and neck supple.  Skin:    General: Skin is warm and dry.  Neurological:     General: No focal deficit present.     Mental Status: She is alert and oriented to person, place, and time.  Psychiatric:        Mood and Affect: Mood normal.        Behavior: Behavior normal.        Thought Content: Thought content normal.        Judgment: Judgment normal.         Assessment & Plan:   Problem List Items Addressed This Visit       Endocrine   Type 2 diabetes mellitus with hyperglycemia, without long-term current use of insulin (HCC) - Primary   Relevant Medications   pregabalin (LYRICA) 150 MG capsule    Blood Glucose Monitoring Suppl (TRUE METRIX METER) w/Device KIT   glucose blood (TRUE METRIX BLOOD GLUCOSE TEST) test strip   TRUEplus Lancets 28G MISC   Other Relevant Orders   Comp. Metabolic Panel (12)   TSH   Microalbumin / creatinine urine ratio     Other   Obesity   Hyperlipidemia   Relevant Orders   Lipid panel   Vitamin D deficiency   Relevant Orders   Vitamin D, 25-hydroxy   Psychophysiological insomnia   Severe episode of recurrent major depressive disorder, without psychotic features (HCC)   Relevant Medications   buPROPion (WELLBUTRIN XL) 150 MG 24 hr tablet   Other Relevant Orders   Ambulatory referral to Social Work   Other Visit Diagnoses     Elevated blood pressure reading in office without diagnosis of hypertension         1. Type 2 diabetes mellitus with hyperglycemia, without long-term current use of insulin (HCC)  Continue current regimen.  Patient given appointment [facility].  Patient to follow-up with mobile in 1 month.  Patient given application for [place] financial assistance.  Patient encouraged to check blood glucose levels on a daily basis, keeping a written log and have available for all office visits.  Red flags given for prompt reevaluation - pregabalin (LYRICA) 150 MG capsule; Take 1 capsule (150 mg total) by mouth 2 (two) times daily.  Dispense: 60 capsule; Refill: 1 - Blood Glucose Monitoring Suppl (TRUE METRIX METER) w/Device KIT; use to check blood sugar daily  Dispense: 1 kit; Refill: 0 - glucose blood (TRUE METRIX BLOOD GLUCOSE TEST) test strip; Use as instructed 2 (two) times daily.  Dispense: 100 each; Refill: 12 - TRUEplus Lancets 28G MISC; Use as directed 2 (two) times daily at 8 am and 10 pm.  Dispense: 100 each; Refill: 11 - Comp. Metabolic Panel (12); Future - TSH; Future - Microalbumin / creatinine urine ratio; Future  2. Elevated blood pressure  reading in office without diagnosis of hypertension Patient encouraged to check blood pressure at home, keep a written log and have available for all office visits.  3. Psychophysiological insomnia Continue current regimen, no refill needed today  4. Vitamin D deficiency  - Vitamin D, 25-hydroxy; Future  5. Hyperlipidemia, unspecified hyperlipidemia type Continue current regimen, no refill needed today - Lipid panel; Future  6. Severe episode of recurrent major depressive disorder, without psychotic features ([place]) Continue current regimen, patient agreeable to referral for CBT, patient education given on coping skills - buPROPion (WELLBUTRIN XL) 150 MG 24 hr tablet; Take 1 tablet (150 mg total) by mouth every morning.  Dispense: 30 tablet; Refill: 1 - Ambulatory referral to Social Work  7. Class 2 severe obesity due to excess calories with serious comorbidity and body mass index (BMI) of 35.0 to 35.9 in adult [facility])    I have reviewed the patient's medical history (PMH, PSH, Social History, Family History, Medications, and allergies) , and have been updated if relevant. I spent 30 minutes reviewing chart and  face to face time with patient.     Return in about 4 weeks (around 03/16/2023) for with [facility].   Loraine Grip Mayers, PA-C

## 2023-02-16 NOTE — Patient Instructions (Addendum)
I sent a refill of your prescriptions to the pharmacy for you.  I also sent a prescription for a blood glucose meter and supplies.  I do encourage you to check your blood glucose levels on a daily basis, keep a written log and have available for all office visits.  Your blood pressure is elevated today, I do encourage you to check your blood pressure at home, keep a written log and have that available for all office visits.  [facility] to have your fasting labs completed.  We will call you with those results when they are available.  I started a referral for you to be seen by the clinic counselor, they will reach out to you to set an appointment.  You will return to the mobile unit in 4 weeks for follow-up.  We will call you at the beginning of that week to let you know our schedule.  Please let us know if there is any else we can do for you  Kennieth Rad, PA-C Physician Assistant [place] [email or web address]   Managing Depression, Adult Depression is a mental health condition that affects your thoughts, feelings, and actions. Being diagnosed with depression can bring you relief if you did not know why you have felt or behaved a certain way. It could also leave you feeling overwhelmed. Finding ways to manage your symptoms can help you feel more positive about your future. How to manage lifestyle changes Being depressed is difficult. Depression can increase the level of everyday stress. Stress can make depression symptoms worse. You may believe your symptoms cannot be managed or will never improve. However, there are many things you can try to help manage your symptoms. There is hope. Managing stress  Stress is your body's reaction to life changes and events, both good and bad. Stress can add to your feelings of depression. Learning to manage your stress can help lessen  your feelings of depression. Try some of the following approaches to reducing your stress (stress reduction techniques): Listen to music that you enjoy and that inspires you. Try using a meditation app or take a meditation class. Develop a practice that helps you connect with your spiritual self. Walk in nature, pray, or go to a place of worship. Practice deep breathing. To do this, inhale slowly through your nose. Pause at the top of your inhale for a few seconds and then exhale slowly, letting yourself relax. Repeat this three or four times. Practice yoga to help relax and work your muscles. Choose a stress reduction technique that works for you. These techniques take time and practice to develop. Set aside 5-15 minutes a day to do them. Therapists can offer training in these techniques. Do these things to help manage stress: Keep a journal. Know your limits. Set healthy boundaries for yourself and others, such as saying "no" when you think something is too much. Pay attention to how you react to certain situations. You may not be able to control everything, but you can change your reaction. Add humor to your life by watching funny movies or shows. Make time for activities that you enjoy and that relax you. Spend less time using electronics, especially at night before bed. The light from screens can make your brain think it is time to get up rather than go to bed.  Medicines Medicines, such as antidepressants, are often a part of treatment for depression. Talk with your pharmacist or  health care provider about all the medicines, supplements, and herbal products that you take, their possible side effects, and what medicines and other products are safe to take together. Make sure to report any side effects you may have to your health care provider. Relationships Your health care provider may suggest family therapy, couples therapy, or individual therapy as part of your treatment. How to  recognize changes Everyone responds differently to treatment for depression. As you recover from depression, you may start to: Have more interest in doing activities. Feel more hopeful. Have more energy. Eat a more regular amount of food. Have better mental focus. It is important to recognize if your depression is not getting better or is getting worse. The symptoms you had in the beginning may return, such as: Feeling tired. Eating too much or too little. Sleeping too much or too little. Feeling restless, agitated, or hopeless. Trouble focusing or making decisions. Having unexplained aches and pains. Feeling irritable, angry, or aggressive. If you or your family members notice these symptoms coming back, let your health care provider know right away. Follow these instructions at home: Activity Try to get some form of exercise each day, such as walking. Try yoga, mindfulness, or other stress reduction techniques. Participate in group activities if you are able. Lifestyle Get enough sleep. Cut down on or stop using caffeine, tobacco, alcohol, and any other harmful substances. Eat a healthy diet that includes plenty of vegetables, fruits, whole grains, low-fat dairy products, and lean protein. Limit foods that are high in solid fats, added sugar, or salt (sodium). General instructions Take over-the-counter and prescription medicines only as told by your health care provider. Keep all follow-up visits. It is important for your health care provider to check on your mood, behavior, and medicines. Your health care provider may need to make changes to your treatment. Where to find support Talking to others  Friends and family members can be sources of support and guidance. Talk to trusted friends or family members about your condition. Explain your symptoms and let them know that you are working with a health care provider to treat your depression. Tell friends and family how they can  help. Finances Find mental health providers that fit with your financial situation. Talk with your health care provider if you are worried about access to food, housing, or medicine. Call your insurance company to learn about your co-pays and prescription plan. Where to find more information You can find support in your area from: Anxiety and Depression Association of America (ADAA): adaa.org Mental Health America: mentalhealthamerica.net [employer] on Mental Illness: nami.org Contact a health care provider if: You stop taking your antidepressant medicines, and you have any of these symptoms: Nausea. Headache. Light-headedness. Chills and body aches. Not being able to sleep (insomnia). You or your friends and family think your depression is getting worse. Get help right away if: You have thoughts of hurting yourself or others. Get help right away if you feel like you may hurt yourself or others, or have thoughts about taking your own life. Go to your nearest emergency room or: Call 911. Call the [employer] [facility] at [phone] or 988. This is open 24 hours a day. Text the Crisis Text Line at 670 046 0540. This information is not intended to replace advice given to you by your health care provider. Make sure you discuss any questions you have with your health care provider. Document Revised: 04/07/2022 Document Reviewed: 04/07/2022 Elsevier Patient Education  [place].

## 2023-02-17 ENCOUNTER — Ambulatory Visit: Payer: Self-pay

## 2023-02-17 DIAGNOSIS — E1165 Type 2 diabetes mellitus with hyperglycemia: Secondary | ICD-10-CM

## 2023-02-17 DIAGNOSIS — E785 Hyperlipidemia, unspecified: Secondary | ICD-10-CM

## 2023-02-17 DIAGNOSIS — E559 Vitamin D deficiency, unspecified: Secondary | ICD-10-CM

## 2023-02-18 ENCOUNTER — Other Ambulatory Visit: Payer: Self-pay

## 2023-02-18 MED ORDER — ERGOCALCIFEROL 1.25 MG (50000 UT) PO CAPS
1.0000 | ORAL_CAPSULE | ORAL | 2 refills | Status: DC
  Filled 2023-02-18 – 2023-02-25 (×2): qty 4, 28d supply, fill #0

## 2023-02-18 NOTE — Addendum Note (Signed)
Addended by: Kennieth Rad on: 02/18/2023 09:37 AM   Modules accepted: Orders

## 2023-02-19 LAB — MICROALBUMIN / CREATININE URINE RATIO
Creatinine, Urine: 141.5 mg/dL
Microalb/Creat Ratio: 18 mg/g creat (ref 0–29)
Microalbumin, Urine: 25.7 ug/mL

## 2023-02-19 LAB — TSH: TSH: 1.69 u[IU]/mL (ref 0.450–4.500)

## 2023-02-19 LAB — COMP. METABOLIC PANEL (12)
AST: 12 IU/L (ref 0–40)
Albumin/Globulin Ratio: 1.8 (ref 1.2–2.2)
Albumin: 4.3 g/dL (ref 3.9–4.9)
Alkaline Phosphatase: 91 IU/L (ref 44–121)
BUN/Creatinine Ratio: 11 (ref 9–23)
BUN: 7 mg/dL (ref 6–24)
Bilirubin Total: 0.4 mg/dL (ref 0.0–1.2)
Calcium: 9.6 mg/dL (ref 8.7–10.2)
Chloride: 99 mmol/L (ref 96–106)
Creatinine, Ser: 0.62 mg/dL (ref 0.57–1.00)
Globulin, Total: 2.4 g/dL (ref 1.5–4.5)
Glucose: 308 mg/dL — ABNORMAL HIGH (ref 70–99)
Potassium: 4.4 mmol/L (ref 3.5–5.2)
Sodium: 136 mmol/L (ref 134–144)
Total Protein: 6.7 g/dL (ref 6.0–8.5)
eGFR: 109 mL/min/{1.73_m2} (ref >59)

## 2023-02-19 LAB — LIPID PANEL
Chol/HDL Ratio: 4 ratio (ref 0.0–4.4)
Cholesterol, Total: 155 mg/dL (ref 100–199)
HDL: 39 mg/dL — ABNORMAL LOW (ref >39)
LDL Chol Calc (NIH): 85 mg/dL (ref 0–99)
Triglycerides: 178 mg/dL — ABNORMAL HIGH (ref 0–149)
VLDL Cholesterol Cal: 31 mg/dL (ref 5–40)

## 2023-02-19 LAB — VITAMIN D 25 HYDROXY (VIT D DEFICIENCY, FRACTURES): Vit D, 25-Hydroxy: 22.6 ng/mL — ABNORMAL LOW (ref 30.0–100.0)

## 2023-02-22 IMAGING — MG MM DIGITAL SCREENING BILAT W/ TOMO AND CAD
6 of 10 series · 6 of 30 positions shown · non-contrast
Comparison: Previous exam(s).

CLINICAL DATA: Screening.

EXAM:
DIGITAL SCREENING BILATERAL MAMMOGRAM WITH TOMOSYNTHESIS AND CAD
TECHNIQUE: Bilateral screening digital craniocaudal and mediolateral oblique
mammograms were obtained. Bilateral screening digital breast
tomosynthesis was performed. The images were evaluated with
computer-aided detection.

[R MLO synth-2D (1 of 2)]
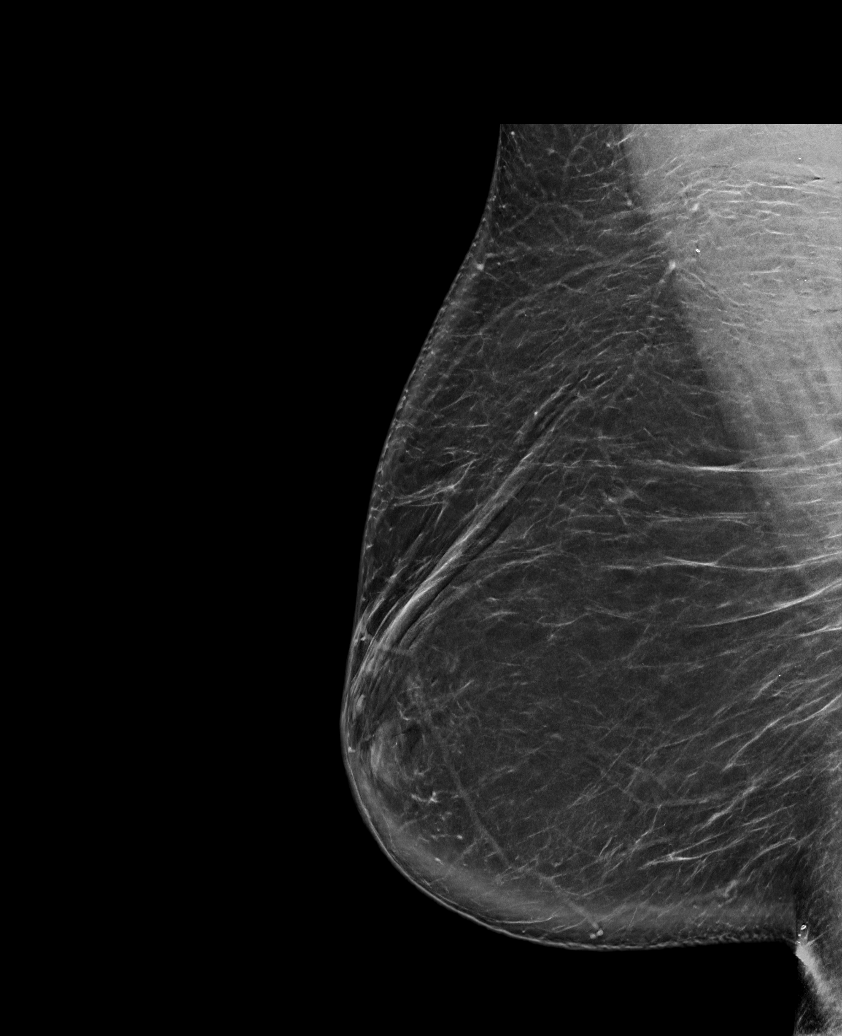

[L CC synth-2D]
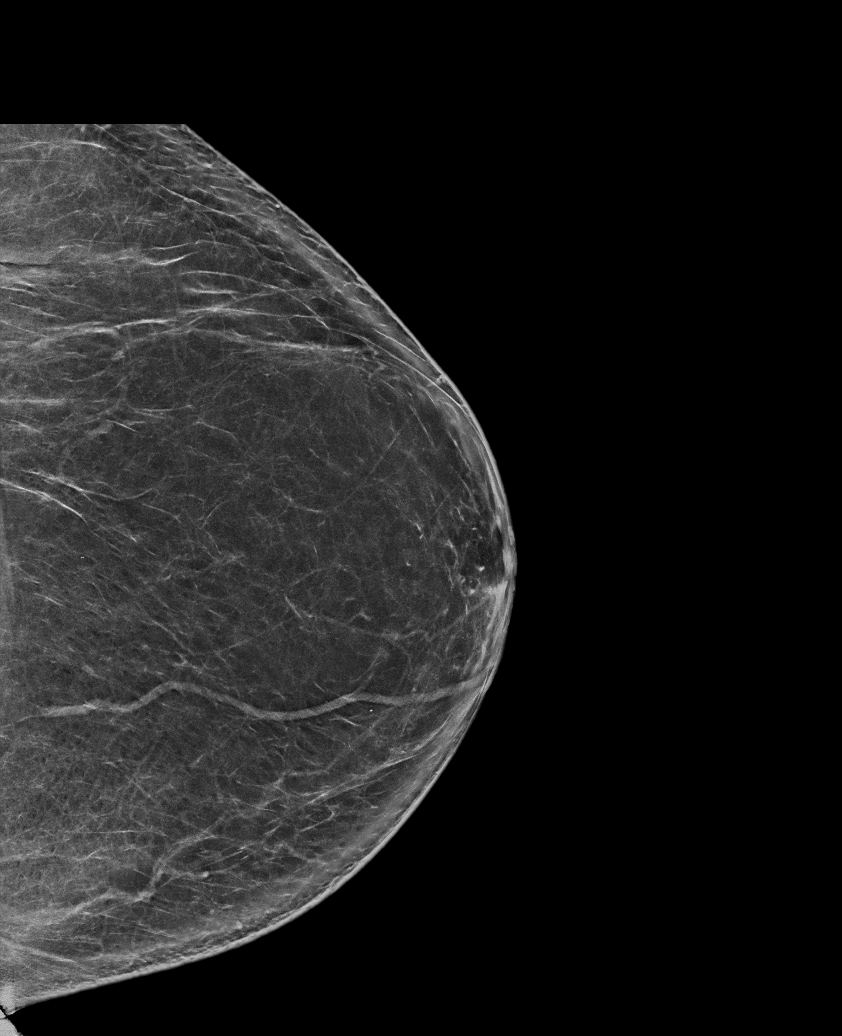

[L MLO synth-2D]
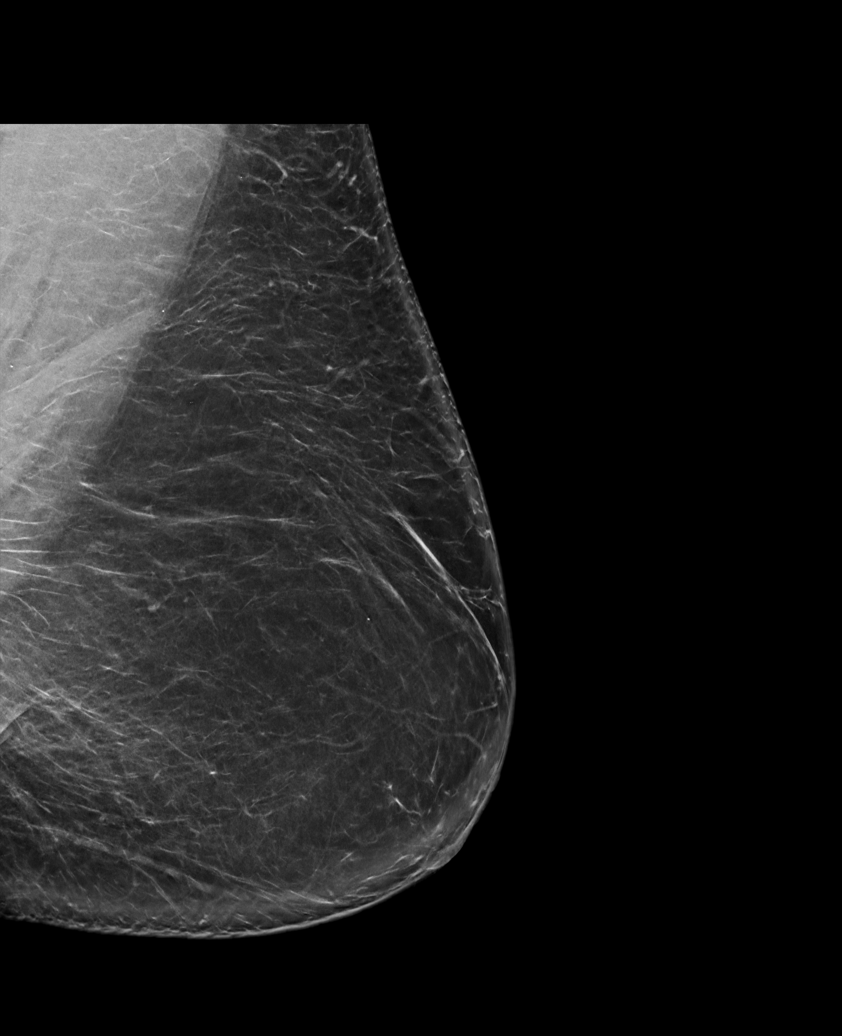

[R MLO synth-2D (2 of 2)]
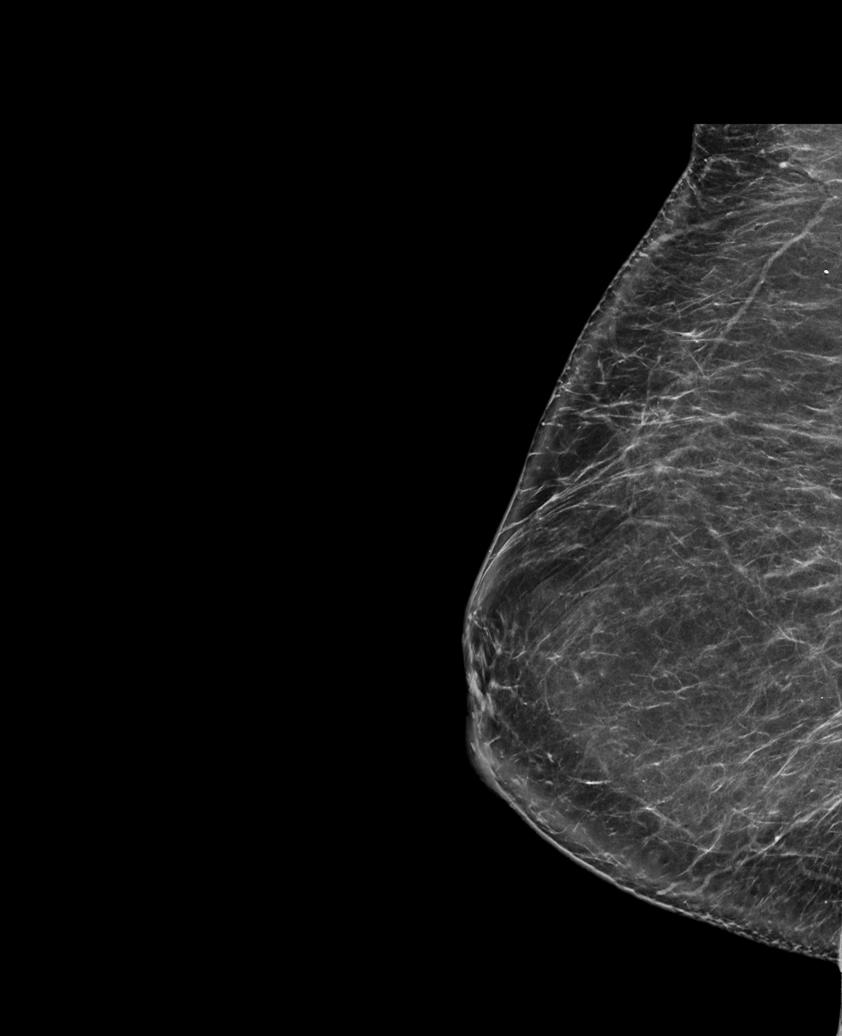

[R CC synth-2D]
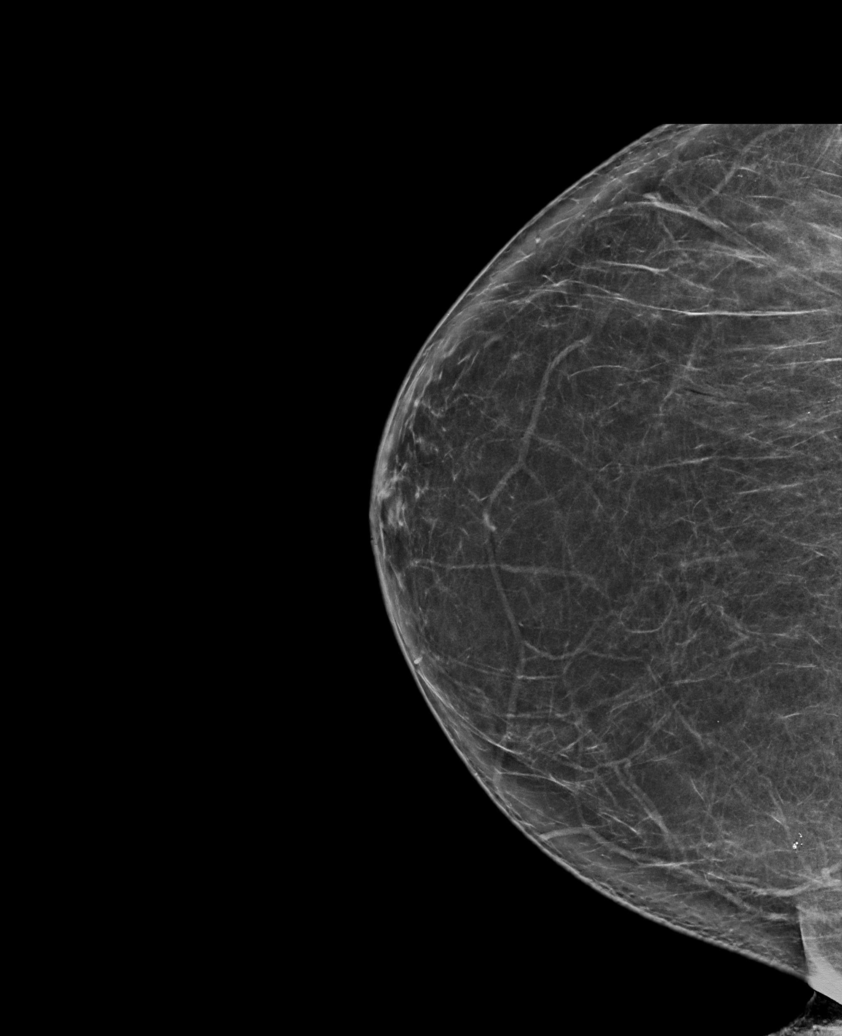

[R MLO tomo · tomo slice 49/97.0]
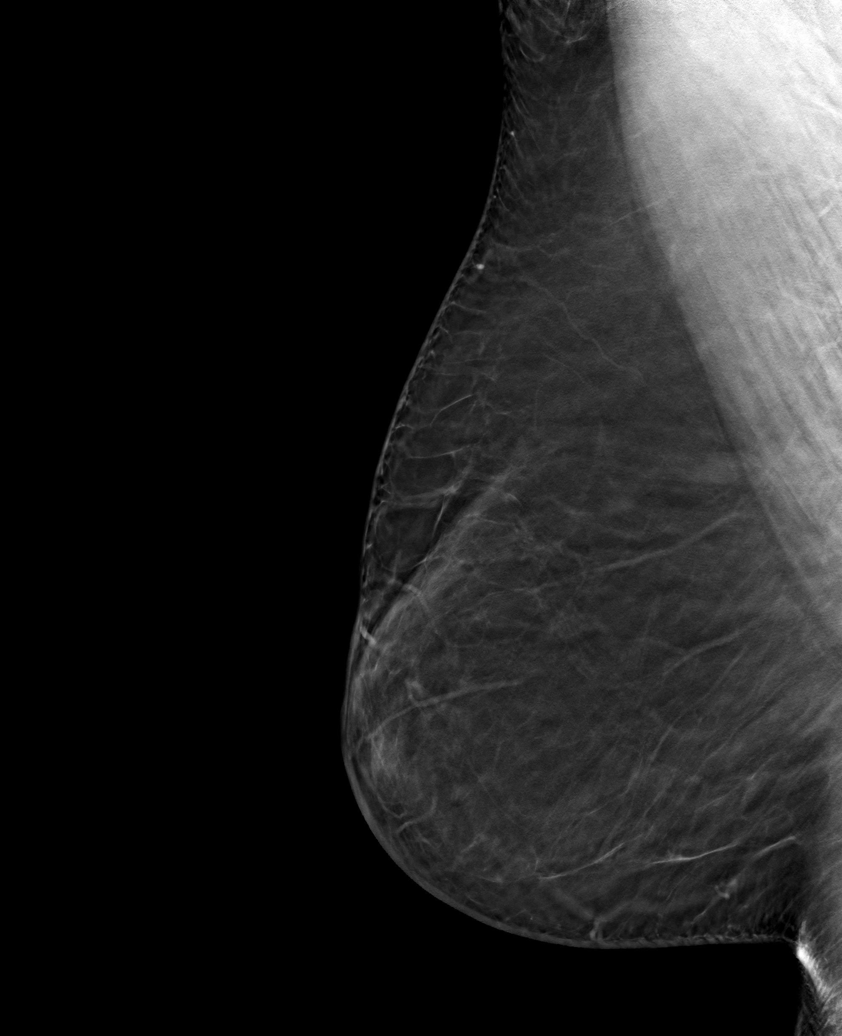

[6 of 30 positions shown; findings below may reference images not displayed]

ACR Breast Density Category b: There are scattered areas of
fibroglandular density.
FINDINGS: There are no findings suspicious for malignancy.
IMPRESSION: No mammographic evidence of malignancy. A result letter of this
screening mammogram will be mailed directly to the patient.

RECOMMENDATION:
Screening mammogram in one year. (Code:51-O-LD2)

BI-RADS CATEGORY  1: Negative.

## 2023-02-23 ENCOUNTER — Other Ambulatory Visit: Payer: Self-pay

## 2023-02-25 ENCOUNTER — Other Ambulatory Visit: Payer: Self-pay

## 2023-02-26 ENCOUNTER — Other Ambulatory Visit: Payer: Self-pay

## 2023-03-18 ENCOUNTER — Other Ambulatory Visit: Payer: Self-pay

## 2023-03-23 ENCOUNTER — Ambulatory Visit: Payer: Self-pay

## 2023-03-23 ENCOUNTER — Other Ambulatory Visit: Payer: Self-pay

## 2023-03-26 ENCOUNTER — Other Ambulatory Visit: Payer: Self-pay

## 2023-03-29 ENCOUNTER — Other Ambulatory Visit: Payer: Self-pay

## 2023-03-29 ENCOUNTER — Ambulatory Visit (INDEPENDENT_AMBULATORY_CARE_PROVIDER_SITE_OTHER): Payer: Self-pay

## 2023-03-29 DIAGNOSIS — Z3042 Encounter for surveillance of injectable contraceptive: Secondary | ICD-10-CM

## 2023-03-29 MED ORDER — MEDROXYPROGESTERONE ACETATE 150 MG/ML IM SUSY
150.0000 mg | PREFILLED_SYRINGE | Freq: Once | INTRAMUSCULAR | Status: AC
  Administered 2023-03-29: 150 mg via INTRAMUSCULAR

## 2023-03-29 MED ORDER — MEDROXYPROGESTERONE ACETATE 150 MG/ML IM SUSY
150.0000 mg | PREFILLED_SYRINGE | INTRAMUSCULAR | 0 refills | Status: DC
  Filled 2023-03-29 – 2023-06-18 (×2): qty 1, 90d supply, fill #0

## 2023-03-29 NOTE — Progress Notes (Signed)
Date last pap: 03/06/2020. Last Depo-Provera: 12/28/22. Side Effects if any: N/A. Serum HCG indicated? N/A. Depo-Provera 150 mg IM given by: Dreama Saa., RN. Given in LD. Patient tolerated well. Next appointment due July 1- July 15.

## 2023-04-02 ENCOUNTER — Other Ambulatory Visit: Payer: Self-pay

## 2023-04-02 MED ORDER — TRAZODONE HCL 100 MG PO TABS
ORAL_TABLET | ORAL | 5 refills | Status: DC
  Filled 2023-04-02: qty 30, 30d supply, fill #0
  Filled 2023-05-17: qty 30, 30d supply, fill #1

## 2023-04-05 ENCOUNTER — Other Ambulatory Visit (HOSPITAL_BASED_OUTPATIENT_CLINIC_OR_DEPARTMENT_OTHER): Payer: Self-pay

## 2023-04-06 ENCOUNTER — Other Ambulatory Visit: Payer: Self-pay

## 2023-04-22 ENCOUNTER — Other Ambulatory Visit: Payer: Self-pay

## 2023-04-22 MED ORDER — FLUOCINOLONE ACETONIDE BODY 0.01 % EX OIL
TOPICAL_OIL | CUTANEOUS | 1 refills | Status: DC
  Filled 2023-04-29: qty 236.56, 30d supply, fill #0

## 2023-04-29 ENCOUNTER — Other Ambulatory Visit: Payer: Self-pay

## 2023-04-30 ENCOUNTER — Other Ambulatory Visit: Payer: Self-pay

## 2023-05-06 ENCOUNTER — Other Ambulatory Visit: Payer: Self-pay

## 2023-05-17 ENCOUNTER — Other Ambulatory Visit: Payer: Self-pay

## 2023-05-24 ENCOUNTER — Other Ambulatory Visit: Payer: Self-pay

## 2023-05-28 ENCOUNTER — Other Ambulatory Visit: Payer: Self-pay

## 2023-05-31 ENCOUNTER — Other Ambulatory Visit: Payer: Self-pay

## 2023-05-31 ENCOUNTER — Ambulatory Visit (INDEPENDENT_AMBULATORY_CARE_PROVIDER_SITE_OTHER): Payer: Self-pay | Admitting: Nurse Practitioner

## 2023-05-31 ENCOUNTER — Encounter: Payer: Self-pay | Admitting: Nurse Practitioner

## 2023-05-31 ENCOUNTER — Other Ambulatory Visit (HOSPITAL_COMMUNITY): Payer: Self-pay

## 2023-05-31 VITALS — BP 146/89 | HR 98 | Wt 262.2 lb

## 2023-05-31 DIAGNOSIS — K635 Polyp of colon: Secondary | ICD-10-CM

## 2023-05-31 DIAGNOSIS — E1165 Type 2 diabetes mellitus with hyperglycemia: Secondary | ICD-10-CM

## 2023-05-31 DIAGNOSIS — E559 Vitamin D deficiency, unspecified: Secondary | ICD-10-CM

## 2023-05-31 DIAGNOSIS — I10 Essential (primary) hypertension: Secondary | ICD-10-CM

## 2023-05-31 LAB — POCT GLYCOSYLATED HEMOGLOBIN (HGB A1C): Hemoglobin A1C: 10.7 % — AB (ref 4.0–5.6)

## 2023-05-31 MED ORDER — SEMAGLUTIDE (2 MG/DOSE) 8 MG/3ML ~~LOC~~ SOPN
2.0000 mg | PEN_INJECTOR | SUBCUTANEOUS | 2 refills | Status: DC
Start: 2023-05-31 — End: 2023-09-22
  Filled 2023-05-31: qty 3, fill #0
  Filled 2023-06-24: qty 3, 28d supply, fill #0
  Filled 2023-07-08: qty 9, 84d supply, fill #0

## 2023-05-31 MED ORDER — ERGOCALCIFEROL 1.25 MG (50000 UT) PO CAPS
1.0000 | ORAL_CAPSULE | ORAL | 2 refills | Status: DC
Start: 2023-05-31 — End: 2023-09-22
  Filled 2023-05-31 – 2023-08-11 (×3): qty 4, 28d supply, fill #0

## 2023-05-31 MED ORDER — TRAZODONE HCL 100 MG PO TABS
100.0000 mg | ORAL_TABLET | Freq: Every evening | ORAL | 5 refills | Status: DC | PRN
  Filled 2023-05-31: qty 30, 30d supply, fill #0
  Filled 2023-08-23: qty 30, 30d supply, fill #1

## 2023-05-31 MED ORDER — FLUOCINOLONE ACETONIDE BODY 0.01 % EX OIL
TOPICAL_OIL | CUTANEOUS | 1 refills | Status: DC
  Filled 2023-05-31: qty 354.84, 30d supply, fill #0
  Filled 2023-06-01 – 2023-08-03 (×2): qty 236.56, 30d supply, fill #0

## 2023-05-31 MED ORDER — BLOOD GLUCOSE MONITOR KIT
PACK | 0 refills | Status: DC
  Filled 2023-05-31: qty 1, fill #0

## 2023-05-31 NOTE — Progress Notes (Signed)
@Patient  ID: Erica [name], female    DOB: 1971/05/28, 52 y.o.   MRN: [id number]  Chief Complaint  Patient presents with   Establish Care    Not fasting     Referring provider: Primus Bravo, NP   HPI  52 year old female with history of type 2 diabetes, peripheral nerve disease, TMJ, vitamin D deficiency, hyperlipidemia, obesity.  Patient presents today to establish care.  She does have a history of diabetes vitamin D deficiency and peripheral nerve disease.  We will check labs today.  Patient states that she does need a referral back to her GI doctor because she has had a history of polyps.  Will place referral today.  Patient does need a referral to podiatry.  A1c in office today was 10.7.  Patient is noncompliant with medications.  Will place referral for pharmacy for medication management for hypertension and diabetes.  We will increase Ozempic dosage today. Denies f/c/s, n/v/d, hemoptysis, PND, leg swelling Denies chest pain or edema    Allergies  Allergen Reactions   Penicillins     Reported from childhood    Immunization History  Administered Date(s) Administered   PNEUMOCOCCAL CONJUGATE-20 05/05/2022   PPD Test 02/08/2023   Tdap 02/01/2020    Past Medical History:  Diagnosis Date   Arthritis    Depression    Diabetes mellitus    Fatty liver    Hernia, abdominal    Hypercholesteremia    IBS (irritable bowel syndrome)     Tobacco History: Social History   Tobacco Use  Smoking Status Never  Smokeless Tobacco Never   Counseling given: Not Answered   Outpatient Encounter Medications as of 05/31/2023  Medication Sig   aspirin 81 MG tablet Take 81 mg by mouth at bedtime.    atorvastatin (LIPITOR) 40 MG tablet Take 1 tablet (40 mg total) by mouth daily.   buPROPion (WELLBUTRIN XL) 150 MG 24 hr tablet Take 1 tablet (150 mg total) by mouth every morning.   escitalopram (LEXAPRO) 20 MG tablet Take 1 tablet (20 mg total) by mouth daily.   lisinopril  (ZESTRIL) 5 MG tablet Take 1 tablet (5 mg total) by mouth daily.   medroxyPROGESTERone Acetate 150 MG/ML SUSY Inject 1 mL (150 mg total) into the muscle every 3 (three) months.   metFORMIN (GLUCOPHAGE) 1000 MG tablet Take 1 tablet (1,000 mg total) by mouth 2 (two) times daily with meals.   pregabalin (LYRICA) 150 MG capsule Take 1 capsule (150 mg total) by mouth 2 (two) times daily.   Semaglutide, 2 MG/DOSE, 8 MG/3ML SOPN Inject 2 mg as directed once a week.   [DISCONTINUED] Fluocinolone Acetonide Body 0.01 % OIL Apply to scalp 2 times daily for 2 weeks, then once daily for 2 weeks, then as needed thereafter.   [DISCONTINUED] Semaglutide, 1 MG/DOSE, (OZEMPIC, 1 MG/DOSE,) 4 MG/3ML SOPN Inject 1 mg into the skin once a week.   [DISCONTINUED] traZODone (DESYREL) 100 MG tablet Take one tablet (100 mg dose) by mouth at bedtime as needed for Sleep.   Accu-Chek Softclix Lancets lancets 4 (four) times daily. (Patient not taking: Reported on 05/31/2023)   blood glucose meter kit and supplies KIT Dispense based on patient and insurance preference. Use up to four times daily as directed.   ergocalciferol (VITAMIN D2) 1.25 MG (50000 UT) capsule Take 1 capsule (50,000 Units total) by mouth once a week.   Fluocinolone Acetonide Body 0.01 % OIL Apply to scalp 2 times daily for 2 weeks, then once  daily for 2 weeks, then as needed thereafter.   glucose blood (TRUE METRIX BLOOD GLUCOSE TEST) test strip Use as instructed 2 (two) times daily. (Patient not taking: Reported on 05/31/2023)   medroxyPROGESTERone Acetate 150 MG/ML SUSY Inject 1 mL (150 mg total) into the muscle every 3 (three) months.   traZODone (DESYREL) 100 MG tablet Take 1 tablet (100 mg total) by mouth at bedtime as needed for sleep   TRUEplus Lancets 28G MISC Use as directed 2 (two) times daily at 8 am and 10 pm. (Patient not taking: Reported on 05/31/2023)   [DISCONTINUED] amitriptyline (ELAVIL) 50 MG tablet amitriptyline 50 mg tablet  Take 1 tablet(s)  every day by oral route as directed for 30 days.   [DISCONTINUED] blood glucose meter kit and supplies KIT Dispense based on patient and insurance preference. Use up to four times daily as directed. (Patient not taking: Reported on 05/31/2023)   [DISCONTINUED] ergocalciferol (VITAMIN D2) 1.25 MG (50000 UT) capsule Take 1 capsule (50,000 Units total) by mouth once a week. (Patient not taking: Reported on 05/31/2023)   [DISCONTINUED] gabapentin (NEURONTIN) 300 MG capsule Take 300 mg by mouth.   No facility-administered encounter medications on file as of 05/31/2023.     Review of Systems  Review of Systems  Constitutional: Negative.   HENT: Negative.    Cardiovascular: Negative.   Gastrointestinal: Negative.   Allergic/Immunologic: Negative.   Neurological: Negative.   Psychiatric/Behavioral: Negative.         Physical Exam  BP (!) 146/89   Pulse 98   Wt 262 lb 3.2 oz (118.9 kg)   SpO2 99%   BMI 35.56 kg/m   Wt Readings from Last 5 Encounters:  05/31/23 262 lb 3.2 oz (118.9 kg)  02/16/23 265 lb (120.2 kg)  12/28/22 267 lb 1.6 oz (121.2 kg)  09/29/22 279 lb (126.6 kg)  04/20/22 299 lb (135.6 kg)     Physical Exam Vitals and nursing note reviewed.  Constitutional:      General: She is not in acute distress.    Appearance: She is well-developed.  Cardiovascular:     Rate and Rhythm: Normal rate and regular rhythm.  Pulmonary:     Effort: Pulmonary effort is normal.     Breath sounds: Normal breath sounds.  Neurological:     Mental Status: She is alert and oriented to person, place, and time.      Lab Results:  CBC    Component Value Date/Time   WBC 6.5 08/01/2021 1345   RBC 4.44 08/01/2021 1345   HGB 12.4 08/01/2021 1345   HCT 38.7 08/01/2021 1345   PLT 357 08/01/2021 1345   MCV 87.2 08/01/2021 1345   MCH 27.9 08/01/2021 1345   MCHC 32.0 08/01/2021 1345   RDW 12.9 08/01/2021 1345   LYMPHSABS 1.9 12/12/2014 0600   MONOABS 0.5 12/12/2014 0600   EOSABS  0.2 12/12/2014 0600   BASOSABS 0.0 12/12/2014 0600    BMET    Component Value Date/Time   NA 136 02/17/2023 0940   K 4.4 02/17/2023 0940   CL 99 02/17/2023 0940   CO2 23 08/01/2021 1345   GLUCOSE 308 (H) 02/17/2023 0940   GLUCOSE 228 (H) 08/01/2021 1345   BUN 7 02/17/2023 0940   CREATININE 0.62 02/17/2023 0940   CALCIUM 9.6 02/17/2023 0940   GFRNONAA >60 08/01/2021 1345   GFRAA >90 12/12/2014 0600      Assessment & Plan:   Type 2 diabetes mellitus with hyperglycemia, without long-term current  use of insulin (HCC) - blood glucose meter kit and supplies KIT; Dispense based on patient and insurance preference. Use up to four times daily as directed.  Dispense: 1 each; Refill: 0 - Ambulatory referral to Podiatry - POCT glycosylated hemoglobin (Hb A1C) - CBC - Comprehensive metabolic panel  - will increase dosage of ozempic to 2 mg weekly  2. Polyp of colon, unspecified part of colon, unspecified type  - Ambulatory referral to Gastroenterology  3. Vitamin D deficiency  - Vitamin D, 25-hydroxy - ergocalciferol (VITAMIN D2) 1.25 MG (50000 UT) capsule; Take 1 capsule (50,000 Units total) by mouth once a week.  Dispense: 4 capsule; Refill: 2   Follow up:  Follow up in 3 months     Ivonne Andrew, NP 05/31/2023

## 2023-05-31 NOTE — Assessment & Plan Note (Signed)
-   blood glucose meter kit and supplies KIT; Dispense based on patient and insurance preference. Use up to four times daily as directed.  Dispense: 1 each; Refill: 0 - Ambulatory referral to Podiatry - POCT glycosylated hemoglobin (Hb A1C) - CBC - Comprehensive metabolic panel  - will increase dosage of ozempic to 2 mg weekly  2. Polyp of colon, unspecified part of colon, unspecified type  - Ambulatory referral to Gastroenterology  3. Vitamin D deficiency  - Vitamin D, 25-hydroxy - ergocalciferol (VITAMIN D2) 1.25 MG (50000 UT) capsule; Take 1 capsule (50,000 Units total) by mouth once a week.  Dispense: 4 capsule; Refill: 2   Follow up:  Follow up in 3 months

## 2023-05-31 NOTE — Patient Instructions (Addendum)
1. Type 2 diabetes mellitus with hyperglycemia, without long-term current use of insulin (HCC)  - blood glucose meter kit and supplies KIT; Dispense based on patient and insurance preference. Use up to four times daily as directed.  Dispense: 1 each; Refill: 0 - Ambulatory referral to Podiatry - POCT glycosylated hemoglobin (Hb A1C) - CBC - Comprehensive metabolic panel  - will increase dosage of ozempic to 2 mg weekly  2. Polyp of colon, unspecified part of colon, unspecified type  - Ambulatory referral to Gastroenterology  3. Vitamin D deficiency  - Vitamin D, 25-hydroxy - ergocalciferol (VITAMIN D2) 1.25 MG (50000 UT) capsule; Take 1 capsule (50,000 Units total) by mouth once a week.  Dispense: 4 capsule; Refill: 2   Follow up:  Follow up in 3 months

## 2023-06-01 ENCOUNTER — Other Ambulatory Visit: Payer: Self-pay

## 2023-06-01 ENCOUNTER — Other Ambulatory Visit (HOSPITAL_COMMUNITY): Payer: Self-pay

## 2023-06-01 LAB — COMPREHENSIVE METABOLIC PANEL
ALT: 12 IU/L (ref 0–32)
AST: 13 IU/L (ref 0–40)
Albumin: 4.3 g/dL (ref 3.9–4.9)
Alkaline Phosphatase: 87 IU/L (ref 44–121)
BUN/Creatinine Ratio: 7 — ABNORMAL LOW (ref 9–23)
BUN: 5 mg/dL — ABNORMAL LOW (ref 6–24)
Bilirubin Total: 0.4 mg/dL (ref 0.0–1.2)
CO2: 23 mmol/L (ref 20–29)
Calcium: 10 mg/dL (ref 8.7–10.2)
Chloride: 100 mmol/L (ref 96–106)
Creatinine, Ser: 0.67 mg/dL (ref 0.57–1.00)
Globulin, Total: 2.7 g/dL (ref 1.5–4.5)
Glucose: 180 mg/dL — ABNORMAL HIGH (ref 70–99)
Potassium: 4.1 mmol/L (ref 3.5–5.2)
Sodium: 138 mmol/L (ref 134–144)
Total Protein: 7 g/dL (ref 6.0–8.5)
eGFR: 107 mL/min/{1.73_m2} (ref >59)

## 2023-06-01 LAB — CBC
Hematocrit: 40.8 % (ref 34.0–46.6)
Hemoglobin: 13.3 g/dL (ref 11.1–15.9)
MCH: 27.7 pg (ref 26.6–33.0)
MCHC: 32.6 g/dL (ref 31.5–35.7)
MCV: 85 fL (ref 79–97)
Platelets: 359 10*3/uL (ref 150–450)
RBC: 4.81 x10E6/uL (ref 3.77–5.28)
RDW: 12.1 % (ref 11.7–15.4)
WBC: 6.1 10*3/uL (ref 3.4–10.8)

## 2023-06-01 LAB — VITAMIN D 25 HYDROXY (VIT D DEFICIENCY, FRACTURES): Vit D, 25-Hydroxy: 17.2 ng/mL — ABNORMAL LOW (ref 30.0–100.0)

## 2023-06-02 ENCOUNTER — Other Ambulatory Visit (HOSPITAL_COMMUNITY): Payer: Self-pay

## 2023-06-07 ENCOUNTER — Other Ambulatory Visit: Payer: Self-pay

## 2023-06-08 ENCOUNTER — Ambulatory Visit: Payer: BC Managed Care – PPO | Admitting: Family Medicine

## 2023-06-10 ENCOUNTER — Other Ambulatory Visit: Payer: Self-pay

## 2023-06-11 ENCOUNTER — Other Ambulatory Visit: Payer: Self-pay

## 2023-06-15 ENCOUNTER — Other Ambulatory Visit (HOSPITAL_COMMUNITY): Payer: Self-pay

## 2023-06-15 ENCOUNTER — Other Ambulatory Visit: Payer: Self-pay | Admitting: Nurse Practitioner

## 2023-06-15 ENCOUNTER — Other Ambulatory Visit: Payer: Self-pay

## 2023-06-15 MED ORDER — METFORMIN HCL 1000 MG PO TABS
1000.0000 mg | ORAL_TABLET | Freq: Two times a day (BID) | ORAL | 5 refills | Status: DC
  Filled 2023-06-15: qty 60, 30d supply, fill #0

## 2023-06-16 ENCOUNTER — Other Ambulatory Visit: Payer: Self-pay

## 2023-06-18 ENCOUNTER — Other Ambulatory Visit: Payer: Self-pay

## 2023-06-18 ENCOUNTER — Other Ambulatory Visit (HOSPITAL_COMMUNITY): Payer: Self-pay

## 2023-06-21 ENCOUNTER — Ambulatory Visit (INDEPENDENT_AMBULATORY_CARE_PROVIDER_SITE_OTHER): Payer: Self-pay | Admitting: *Deleted

## 2023-06-21 ENCOUNTER — Other Ambulatory Visit: Payer: Self-pay

## 2023-06-21 DIAGNOSIS — Z3042 Encounter for surveillance of injectable contraceptive: Secondary | ICD-10-CM

## 2023-06-21 MED ORDER — MEDROXYPROGESTERONE ACETATE 150 MG/ML IM SUSP
150.0000 mg | Freq: Once | INTRAMUSCULAR | Status: AC
  Administered 2023-06-21: 150 mg via INTRAMUSCULAR

## 2023-06-21 NOTE — Progress Notes (Signed)
Date last pap: 03/06/20. Last Depo-Provera: 03/29/23. Side Effects if any: NA . Serum HCG indicated? Na. Depo-Provera 150 mg IM given by: Chriss Driver . Next appointment due 9/23-10/7.   Pt supplied

## 2023-06-22 NOTE — Progress Notes (Signed)
Patient was assessed and managed by nursing staff during this encounter. I have reviewed the chart and agree with the documentation and plan. I have also made any necessary editorial changes.  Warden Fillers, MD 06/22/2023 1:09 PM

## 2023-06-23 ENCOUNTER — Telehealth: Payer: Self-pay | Admitting: Pharmacist

## 2023-06-23 NOTE — Telephone Encounter (Signed)
Patient requesting a call back to reschedule her appt with a pharmacist on 06/24/23.  [phone]  Thanks!

## 2023-06-24 ENCOUNTER — Ambulatory Visit: Payer: Self-pay | Admitting: Podiatry

## 2023-06-24 ENCOUNTER — Ambulatory Visit: Payer: Self-pay

## 2023-06-24 ENCOUNTER — Other Ambulatory Visit: Payer: Self-pay

## 2023-06-24 ENCOUNTER — Other Ambulatory Visit: Payer: Self-pay | Admitting: Pharmacist

## 2023-06-24 MED ORDER — VALSARTAN 80 MG PO TABS
80.0000 mg | ORAL_TABLET | Freq: Every day | ORAL | 3 refills | Status: DC
  Filled 2023-06-24: qty 90, 90d supply, fill #0

## 2023-06-24 MED ORDER — METFORMIN HCL 1000 MG PO TABS
1000.0000 mg | ORAL_TABLET | Freq: Two times a day (BID) | ORAL | 3 refills | Status: DC
  Filled 2023-06-24 – 2023-08-23 (×2): qty 180, 90d supply, fill #0

## 2023-06-24 NOTE — Progress Notes (Signed)
06/24/2023 Name: Erica [name] MRN: [id number] DOB: November 21, 1971  Chief Complaint  Patient presents with   Medication Management   Diabetes   Hypertension    Erica [name] is a 52 y.o. year old female who presented for a telephone visit.   They were referred to the pharmacist by their PCP for assistance in managing diabetes, hypertension, hyperlipidemia, and complex medication management.   Subjective:  Care Team: Primary Care Provider: Ivonne Andrew, NP ; Next Scheduled Visit:   Medication Access/Adherence  Current Pharmacy:  [facility] [place] [place] - [address] [place] [address] Phone: [phone] Fax: [phone]  [facility] [address] [place] [address] Phone: [phone] Fax: [phone]  [facility] [address] 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   Current glucose readings: not checking right now; needs glucometer  Patient denies hypoglycemic s/sx including dizziness, shakiness, sweating. Patient denies hyperglycemic symptoms including polyuria, polydipsia, polyphagia, nocturia, neuropathy, blurred vision.  Current meal patterns: skips a lot of meals; drinks tea and regular soda; does not like diet sodas. Notes she likes water; reports she drinks soda/tea/pepsi when  stressed   Reports limited meals, and is not generally hungry. IBS, hx cholecystectomy.   Physical activity: reports she does not have the energy for it with work.   Medication Access - Ozempic through PAP  Hypertension:  Current medications: lisinopril 5 mg daily - needs a refill   Patient does not have a validated, automated, upper arm home BP cuff, but is interested in getting one  Hyperlipidemia/ASCVD Risk Reduction  Current lipid lowering medications: atorvastatin 40 mg daily QPM Medications tried in the past:   Antiplatelet regimen: aspirin 81 mg daily  Depression/Anxiety, Chronic Pain:  Current medications: escitalopram 20 mg daily, bupropion XL 150 mg daily, trazodone 100 mg QPM  Notes she does not feel like the pregabalin 150 mg twice daily gives significant benefit  Notes some burnout from working, caring for others all the time and not focusing on herself. Other family/loved one stressors as well.   Objective:  Lab Results  Component Value Date   HGBA1C 10.7 (A) 05/31/2023    Lab Results  Component Value Date   CREATININE 0.67 05/31/2023   BUN 5 (L) 05/31/2023   NA 138 05/31/2023   K 4.1 05/31/2023   CL 100 05/31/2023   CO2 23 05/31/2023    Lab Results  Component Value Date   CHOL 155 02/17/2023   HDL 39 (L) 02/17/2023   LDLCALC 85 02/17/2023   TRIG 178 (H) 02/17/2023   CHOLHDL 4.0 02/17/2023    Medications Reviewed Today     Reviewed by Alden Hipp, RPH-CPP (Pharmacist) on 06/24/23 at 1011  Med List Status: <None>   Medication Order Taking? Sig Documenting Provider Last Dose Status Informant  Accu-Chek Softclix Lancets lancets [id number]  4 (four) times daily.  Patient not taking:  Reported on 05/31/2023   [provider]  Active     Discontinued 11/10/20 1306            Med Note Rocco Pauls Nov 10, 2020 12:39 PM) Pt does not take  aspirin 81 MG tablet [id number] Yes Take 81 mg by mouth at bedtime.  [provider] Taking Active Self  atorvastatin (LIPITOR) 40 MG tablet [id number] Yes Take 1 tablet (40 mg total) by mouth daily.  Taking Active   blood glucose meter kit and supplies KIT [id number]  Dispense based on patient and insurance preference. Use up to four times daily as directed. Ivonne Andrew, NP  Active   buPROPion (WELLBUTRIN XL) 150 MG 24 hr tablet [id number] Yes Take 1 tablet (150 mg total) by mouth every morning. Mayers, Cari S, PA-C Taking Active   ergocalciferol (VITAMIN D2) 1.25 MG (50000 UT) capsule [id number] Yes Take 1 capsule by mouth once a week. Ivonne Andrew, NP Taking Active   escitalopram (LEXAPRO) 20 MG tablet [id number] Yes Take 1 tablet (20 mg total) by mouth daily.  Taking Active   Fluocinolone Acetonide Body 0.01 % OIL [id number]  Apply to scalp 2 times daily for 2 weeks, then once daily for 2 weeks, then as needed thereafter. Ivonne Andrew, NP  Active     Discontinued 11/10/20 1306            Med Note Oneta Rack, Doreene Burke Nov 10, 2020 12:38 PM) Pt reports she does not take  glucose blood (TRUE METRIX BLOOD GLUCOSE TEST) test strip [id number]  Use as instructed 2 (two) times daily.  Patient not taking: Reported on 05/31/2023   Mayers, Cari S, PA-C  Active   lisinopril (ZESTRIL) 5 MG tablet [id number] Yes Take 1 tablet (5 mg total) by mouth daily. Primus Bravo, NP Taking Active   medroxyPROGESTERone Acetate 150 MG/ML SUSY [id number] Yes Inject 1 mL (150 mg total) into the muscle every 3 (three) months. Constant, Peggy, MD Taking Active   medroxyPROGESTERone Acetate 150 MG/ML SUSY [id number] Yes Inject 1 mL (150 mg total) into the muscle every 3 (three) months. Warden Fillers, MD Taking Active   metFORMIN (GLUCOPHAGE) 1000 MG tablet [id number] Yes Take 1 tablet (1,000 mg total) by mouth 2 (two) times daily with meals. Ivonne Andrew, NP Taking Active   pregabalin (LYRICA) 150 MG capsule [id number] Yes Take 1 capsule (150 mg total) by mouth 2 (two) times daily.  Mayers, Kasandra Knudsen, PA-C Taking Active   Semaglutide, 2 MG/DOSE, 8 MG/3ML SOPN [id number] Yes Inject 2 mg as directed once a week. Ivonne Andrew, NP Taking Active            Med Note Merla Riches Jun 24, 2023 10:08 AM) Patient assistance  traZODone (DESYREL) 100 MG tablet [id number] Yes Take 1 tablet (100 mg total) by mouth at bedtime as needed for sleep Ivonne Andrew, NP Taking Active   TRUEplus Lancets 28G MISC [id number] Yes Use as directed 2 (two) times daily at 8 am and 10 pm. Mayers, Cari S, PA-C Taking Active               Assessment/Plan:   Diabetes: - Currently uncontrolled - Reviewed long term cardiovascular and renal outcomes of uncontrolled blood sugar - Reviewed goal A1c, goal fasting, and goal 2 hour post prandial glucose - Reviewed dietary modifications including: focus on minimizing sugary drinks; proteins, fruits and vegetables,  whole grains.  - Recommend to continue metformin 1000 mg twice daily and Ozempic 2 mg weekly. Focus on periodic home blood sugar checks. Will collaborate with pharmacy to transfer and fill glucometer and supplies.   Hypertension: - Currently uncontrolled - Reviewed long term cardiovascular and renal outcomes of uncontrolled blood pressure - Reviewed appropriate blood pressure monitoring technique and reviewed goal blood pressure. Recommended to check home blood pressure and heart rate periodically - Discussed purchasing OTC Omron Upper Arm BP machine - Recommend to not refill lisinopril, but switch to valsartan 80 mg daily due to lower risk of angioedema. Discussed with PCP, she is in agreement. BMP in 1 month with follow up.  Hyperlipidemia/ASCVD Risk Reduction: - Currently uncontrolled but likely related to adherence - Discussed that atorvastatin can be taken in the morning if easier for adherence. Recommend to continue current regimen at this time  Depression/Anxiety: - Currently uncontrolled but improved and tolerable per  patient report. Declined interested in therapy at this time.  - Recommended to continue current regimen at this time  Will provide information on Medicaid enrollment.    Follow Up Plan: phone call in 6 weeks  Catie TClearance Coots, PharmD, BCACP, CPP Clinical Pharmacist [facility] [phone]

## 2023-06-24 NOTE — Patient Instructions (Addendum)
[place] has recently expanded access to [employer]. [place] is offering assistance for patients seeking to apply for Medicaid to connect in-person with [facility] DSS Medicaid Eligibility and Enrollment Specialists. With no appointment necessary, individuals can simply walk in and receive assistance from an available caseworker. They will provide help in submitting applications for Medicaid.   Where: [facility] [address] [phone]. East in [place]; fourth floor) When: Mondays through Fridays between 8am and 5pm (closed 12:30-1:30pm for lunch)  You can also call [phone] to get free help from our [facility] Expansion Application Assistance team or visit [email or web address] to learn more about eligibility and how to apply.   Continue metformin and Ozempic. Check your blood sugars periodically, alternating between: 1) Fasting, first thing in the morning before breakfast and  2) 2 hours after your largest meal.   For a goal A1c of less than 7%, goal fasting readings are less than 130 and goal 2 hour after meal readings are less than 180.    Start valsartan instead of lisinopril for blood pressure. I recommend purchasing an upper arm Omron Series 3 blood pressure machine. This is ~ $30-36 at the [facility], [employer], [place], etc.   Check your blood pressure periodically, and any time you have concerning symptoms like headache, chest pain, dizziness, shortness of breath, or vision changes.   Our goal is less than 130/80.  To appropriately check your blood pressure, make sure you do the following:  1) Avoid caffeine, exercise, or tobacco products for 30 minutes before checking. Empty your bladder. 2) Sit with your back supported in a flat-backed chair. Rest your arm on something flat (arm of the chair, table, etc). 3) Sit still with your feet flat on the floor, resting, for at least 5 minutes.  4) Check your blood pressure. Take 1-2 readings.  5) Write  down these readings and bring with you to any provider appointments.  Bring your home blood pressure machine with you to a provider's office for accuracy comparison at least once a year.   Make sure you take your blood pressure medications before you come to any office visit, even if you were asked to fast for labs.   Morning: - Metformin 1000 mg (blood sugar) - Valsartan 80 mg (blood pressure - Atorvastatin 40 mg (cholesterol) - Escitalopram 20 mg daily (depression/anxiety) - Bupropion XL 150 mg daily (depression) - Pregabalin 150 mg (nerve pain)  Evening: - Metformin 1000 mg (blood sugar) - Pregabalin 150 mg (nerve pain) - Trazodone 100 mg - sleep  Once weekly - Ozempic 2 mg, Vitamin D 50,000 units.   Please reach out with any questions!  Catie Eppie Gibson, PharmD, BCACP, CPP Clinical Pharmacist [facility] [phone]

## 2023-06-28 ENCOUNTER — Ambulatory Visit: Payer: Self-pay | Admitting: Podiatry

## 2023-07-01 ENCOUNTER — Other Ambulatory Visit: Payer: Self-pay

## 2023-07-08 ENCOUNTER — Other Ambulatory Visit: Payer: Self-pay

## 2023-07-12 ENCOUNTER — Other Ambulatory Visit: Payer: Self-pay

## 2023-07-16 ENCOUNTER — Other Ambulatory Visit: Payer: Self-pay

## 2023-08-03 ENCOUNTER — Other Ambulatory Visit: Payer: Self-pay

## 2023-08-03 ENCOUNTER — Other Ambulatory Visit: Payer: Self-pay | Admitting: Pharmacist

## 2023-08-03 MED ORDER — BLOOD GLUCOSE TEST VI STRP
ORAL_STRIP | 3 refills | Status: AC
  Filled 2023-08-03: qty 100, 50d supply, fill #0

## 2023-08-03 MED ORDER — TRUE METRIX METER W/DEVICE KIT
PACK | 0 refills | Status: AC
  Filled 2023-08-03: qty 1, 30d supply, fill #0

## 2023-08-03 MED ORDER — LANCET DEVICE MISC
0 refills | Status: AC
  Filled 2023-08-03: qty 1, fill #0

## 2023-08-03 MED ORDER — TRUEPLUS LANCETS 28G MISC
3 refills | Status: DC
  Filled 2023-08-03: qty 100, 50d supply, fill #0

## 2023-08-03 NOTE — Patient Instructions (Signed)
Ms. Mott,   It was great talking to you today!  Continue your current regimen. You can try moving escitalopram to the evening to see if this helps with fatigue.   Check your blood sugars periodically:  1) Fasting, first thing in the morning before breakfast and  2) 2 hours after your largest meal.   For a goal A1c of less than 7%, goal fasting readings are less than 130 and goal 2 hour after meal readings are less than 180.    Take care!  Catie Eppie Gibson, PharmD, BCACP, CPP Clinical Pharmacist [facility] [phone]

## 2023-08-03 NOTE — Progress Notes (Signed)
08/03/2023 Name: Erica [name] MRN: [id number] DOB: 10-Feb-1971  Chief Complaint  Patient presents with   Medication Management   Diabetes    Erica [name] is a 52 y.o. year old female who presented for a telephone visit.   They were referred to the pharmacist by their PCP for assistance in managing diabetes and hypertension.    Subjective:  Care Team: Primary Care Provider: Ivonne Andrew, NP ; Next Scheduled Visit: 09/01/23  Medication Access/Adherence  Current Pharmacy:  [facility]- [place], [place] - [address] [place] [address] Phone: [phone] Fax: [phone]  [facility] - [facility] [address] [place] [address] Phone: [phone] Fax: [phone]   Patient reports affordability concerns with their medications: No  Patient reports access/transportation concerns to their pharmacy: No  Patient reports adherence concerns with their medications:  No    Reports continued fatigue. Notes she sleeps 8 hours per night after taking trazodone, but still wakes up sleepy. Has been taking Vitamin D weekly.   Diabetes:   Current medications: metformin 1000 mg twice daily, Ozempic 2 mg weekly   Reports she does not have a glucometer.   Hypertension:   Current medications: lisinopril 5 mg daily   Hyperlipidemia/ASCVD Risk Reduction   Current lipid lowering medications: atorvastatin 40 mg daily    Antiplatelet regimen: aspirin 81 mg daily   Depression/Anxiety, Chronic Pain:   Current medications: escitalopram 20 mg daily, bupropion XL 150 mg daily, trazodone 100 mg QPM   Notes she does not feel like the pregabalin 150 mg twice daily gives significant benefit. Is wondering if she can change the time of day she takes anything to help improve fatigue     Objective:  Lab Results  Component Value Date   HGBA1C 10.7 (A) 05/31/2023    Lab Results   Component Value Date   CREATININE 0.67 05/31/2023   BUN 5 (L) 05/31/2023   NA 138 05/31/2023   K 4.1 05/31/2023   CL 100 05/31/2023   CO2 23 05/31/2023    Lab Results  Component Value Date   CHOL 155 02/17/2023   HDL 39 (L) 02/17/2023   LDLCALC 85 02/17/2023   TRIG 178 (H) 02/17/2023   CHOLHDL 4.0 02/17/2023    Medications Reviewed Today     Reviewed by Alden Hipp, RPH-CPP (Pharmacist) on 08/03/23 at 1315  Med List Status: <None>   Medication Order Taking? Sig Documenting Provider Last Dose Status Informant  Accu-Chek Softclix Lancets lancets [id number]  4 (four) times daily.  Patient not taking: Reported on 05/31/2023   [provider]  Active     Discontinued 11/10/20 1306            Med Note Rocco Pauls Nov 10, 2020 12:39 PM) Pt does not take  aspirin 81 MG tablet [id number] Yes Take 81 mg by mouth at bedtime.  [provider] Taking Active Self  atorvastatin (LIPITOR) 40 MG tablet [id number] Yes Take 1 tablet (40 mg total) by mouth daily.  Taking Active   blood glucose meter kit and supplies KIT [id number]  Dispense based on patient and insurance preference. Use up to four times daily as directed. Ivonne Andrew, NP  Active   Blood Glucose Monitoring Suppl DEVI [id number] Yes Use to check blood sugar up to twice daily. May substitute to any manufacturer covered by patient's insurance. Ivonne Andrew, NP  Active   buPROPion (WELLBUTRIN XL) 150 MG 24 hr tablet [id number]  Take 1 tablet (150 mg total) by mouth every morning. Mayers, Cari S, PA-C  Active   ergocalciferol (VITAMIN D2) 1.25 MG (50000 UT) capsule [id number] Yes Take 1 capsule by mouth once a week. Ivonne Andrew, NP Taking Active   escitalopram (LEXAPRO) 20 MG tablet [id number] Yes Take 1 tablet (20 mg total) by mouth daily.  Taking Active   Fluocinolone Acetonide Body 0.01 % OIL [id number]  Apply to scalp 2 times daily for 2 weeks, then once daily for 2 weeks, then as needed  thereafter. Ivonne Andrew, NP  Active     Discontinued 11/10/20 1306            Med Note Oneta Rack, Doreene Burke Nov 10, 2020 12:38 PM) Pt reports she does not take  Glucose Blood (BLOOD GLUCOSE TEST STRIPS) STRP [id number] Yes Use to check blood sugar up to twice daily. May substitute to any manufacturer covered by patient's insurance. Ivonne Andrew, NP  Active   Lancet Device MISC [id number] Yes Use to check blood sugar up to twice daily. May substitute to any manufacturer covered by patient's insurance. Ivonne Andrew, NP  Active   Lancets Misc. MISC [id number] Yes Use to check blood sugar up to twice daily. May substitute to any manufacturer covered by patient's insurance. Ivonne Andrew, NP  Active   medroxyPROGESTERone Acetate 150 MG/ML SUSY [id number]  Inject 1 mL (150 mg total) into the muscle every 3 (three) months. Constant, Peggy, MD  Active   medroxyPROGESTERone Acetate 150 MG/ML SUSY [id number] No Inject 1 mL (150 mg total) into the muscle every 3 (three) months.  Patient not taking: Reported on 08/03/2023   Warden Fillers, MD Not Taking Active   metFORMIN (GLUCOPHAGE) 1000 MG tablet [id number] Yes Take 1 tablet (1,000 mg total) by mouth 2 (two) times daily with a meal. Ivonne Andrew, NP Taking Active   pregabalin (LYRICA) 150 MG capsule [id number]  Take 1 capsule (150 mg total) by mouth 2 (two) times daily. Mayers, Kasandra Knudsen, PA-C  Active   Semaglutide, 2 MG/DOSE, 8 MG/3ML SOPN [id number] Yes Inject 2 mg as directed once a week. Ivonne Andrew, NP Taking Active            Med Note Merla Riches Jun 24, 2023 10:08 AM) Patient assistance  traZODone (DESYREL) 100 MG tablet [id number] Yes Take 1 tablet (100 mg total) by mouth at bedtime as needed for sleep Ivonne Andrew, NP Taking Active   TRUEplus Lancets 28G MISC [id number]  Use as directed 2 (two) times daily at 8 am and 10 pm. Mayers, Cari S, PA-C  Active   valsartan (DIOVAN) 80 MG tablet [id number]  Take 1 tablet  (80 mg total) by mouth daily. Ivonne Andrew, NP  Active               Assessment/Plan:   Diabetes: - Currently uncontrolled - Reviewed long term cardiovascular and renal outcomes of uncontrolled blood sugar - Reviewed goal A1c, goal fasting, and goal 2 hour post prandial glucose - Recommend to add in glucose checks periodically, alternating between fasting and 2 hour post prandial. Continue current regimen at this time, follow up with PCP as scheduled.   Hypertension: - Currently uncontrolled, but had not taken antihypertensive prior to last in office visit - Recommend to continue current regimen   Hyperlipidemia/ASCVD Risk Reduction: -  Currently uncontrolled, last LDL above goal <70 - Recommend to continue current regimen. If next lipid panel is not at goal, recommend to increase atorvastatin to 80 mg daily  Depression/Anxiety and Insomnia: - Currently unclear control, significant fatigue throughout the day.  - Discussed that she can try moving escitalopram to the evenings to see if this is contributing to fatigue. Continue trazodone in the evenings, bupropion in the mornings. Encouraged to discuss with PCP moving forward.     Follow Up Plan: PCP in 4 weeks, PharmD 4 weeks after that  Catie Eppie Gibson, PharmD, BCACP, CPP Clinical Pharmacist [facility] [phone]

## 2023-08-05 ENCOUNTER — Other Ambulatory Visit: Payer: Self-pay

## 2023-08-11 ENCOUNTER — Other Ambulatory Visit: Payer: Self-pay | Admitting: Nurse Practitioner

## 2023-08-11 ENCOUNTER — Other Ambulatory Visit: Payer: Self-pay

## 2023-08-17 ENCOUNTER — Other Ambulatory Visit: Payer: Self-pay

## 2023-08-18 ENCOUNTER — Other Ambulatory Visit: Payer: Self-pay

## 2023-08-23 ENCOUNTER — Other Ambulatory Visit: Payer: Self-pay | Admitting: Physician Assistant

## 2023-08-23 ENCOUNTER — Other Ambulatory Visit: Payer: Self-pay

## 2023-08-23 DIAGNOSIS — E1165 Type 2 diabetes mellitus with hyperglycemia: Secondary | ICD-10-CM

## 2023-08-24 ENCOUNTER — Other Ambulatory Visit: Payer: Self-pay

## 2023-09-01 ENCOUNTER — Telehealth: Payer: Self-pay | Admitting: Nurse Practitioner

## 2023-09-02 ENCOUNTER — Other Ambulatory Visit: Payer: Self-pay

## 2023-09-03 ENCOUNTER — Other Ambulatory Visit: Payer: Self-pay

## 2023-09-06 ENCOUNTER — Ambulatory Visit (INDEPENDENT_AMBULATORY_CARE_PROVIDER_SITE_OTHER): Payer: Self-pay | Admitting: Emergency Medicine

## 2023-09-06 VITALS — BP 158/106 | HR 102 | Ht 72.0 in | Wt 260.0 lb

## 2023-09-06 DIAGNOSIS — Z3042 Encounter for surveillance of injectable contraceptive: Secondary | ICD-10-CM

## 2023-09-06 MED ORDER — MEDROXYPROGESTERONE ACETATE 150 MG/ML IM SUSP
150.0000 mg | Freq: Once | INTRAMUSCULAR | Status: AC
Start: 2023-09-06 — End: 2023-09-06
  Administered 2023-09-06: 150 mg via INTRAMUSCULAR

## 2023-09-06 NOTE — Progress Notes (Signed)
  Date last pap: 03/06/2020. Maurice Small Depo-Provera: 06/21/2023. Side Effects if any: NA. Serum HCG indicated? NA. Depo-Provera 150 mg IM given by: Resa Miner, RN into LD, tolerated well in office.Marland Kitchen Next appointment due Dec 9-Dec 23.

## 2023-09-07 ENCOUNTER — Ambulatory Visit: Payer: Self-pay

## 2023-09-18 ENCOUNTER — Other Ambulatory Visit: Payer: Self-pay | Admitting: Nurse Practitioner

## 2023-09-18 DIAGNOSIS — Z1231 Encounter for screening mammogram for malignant neoplasm of breast: Secondary | ICD-10-CM

## 2023-09-22 ENCOUNTER — Encounter: Payer: Self-pay | Admitting: Nurse Practitioner

## 2023-09-22 ENCOUNTER — Other Ambulatory Visit: Payer: Self-pay

## 2023-09-22 ENCOUNTER — Telehealth (INDEPENDENT_AMBULATORY_CARE_PROVIDER_SITE_OTHER): Payer: No Typology Code available for payment source | Admitting: Nurse Practitioner

## 2023-09-22 VITALS — Ht 72.0 in | Wt 260.0 lb

## 2023-09-22 DIAGNOSIS — F332 Major depressive disorder, recurrent severe without psychotic features: Secondary | ICD-10-CM

## 2023-09-22 DIAGNOSIS — K219 Gastro-esophageal reflux disease without esophagitis: Secondary | ICD-10-CM

## 2023-09-22 DIAGNOSIS — Z7985 Long-term (current) use of injectable non-insulin antidiabetic drugs: Secondary | ICD-10-CM

## 2023-09-22 DIAGNOSIS — E1165 Type 2 diabetes mellitus with hyperglycemia: Secondary | ICD-10-CM

## 2023-09-22 DIAGNOSIS — E559 Vitamin D deficiency, unspecified: Secondary | ICD-10-CM

## 2023-09-22 MED ORDER — OMEPRAZOLE 20 MG PO CPDR
20.0000 mg | DELAYED_RELEASE_CAPSULE | Freq: Every day | ORAL | 3 refills | Status: DC
Start: 2023-09-22 — End: 2024-02-05
  Filled 2023-09-22 – 2023-10-14 (×2): qty 30, 30d supply, fill #0
  Filled 2023-11-05 – 2024-01-03 (×3): qty 30, 30d supply, fill #1
  Filled 2024-01-24 – 2024-01-26 (×2): qty 30, 30d supply, fill #2

## 2023-09-22 MED ORDER — METFORMIN HCL 1000 MG PO TABS
1000.0000 mg | ORAL_TABLET | Freq: Two times a day (BID) | ORAL | 3 refills | Status: DC
  Filled 2023-09-22: qty 180, 90d supply, fill #0

## 2023-09-22 MED ORDER — BUPROPION HCL ER (XL) 150 MG PO TB24
150.0000 mg | ORAL_TABLET | Freq: Every morning | ORAL | 1 refills | Status: DC
  Filled 2023-09-22 – 2023-10-14 (×2): qty 30, 30d supply, fill #0
  Filled 2023-11-05: qty 30, 30d supply, fill #1

## 2023-09-22 MED ORDER — ESCITALOPRAM OXALATE 20 MG PO TABS
20.0000 mg | ORAL_TABLET | Freq: Every day | ORAL | 3 refills | Status: DC
  Filled 2023-09-22: qty 90, 90d supply, fill #0
  Filled 2023-10-14: qty 30, 30d supply, fill #0
  Filled 2023-11-05 – 2024-01-03 (×3): qty 30, 30d supply, fill #1
  Filled 2024-01-24 – 2024-01-26 (×2): qty 30, 30d supply, fill #2
  Filled 2024-02-14 – 2024-02-15 (×2): qty 30, 30d supply, fill #3
  Filled 2024-02-23 – 2024-04-13 (×5): qty 30, 30d supply, fill #4
  Filled 2024-05-01 – 2024-05-11 (×2): qty 30, 30d supply, fill #5
  Filled 2024-06-08 – 2024-06-20 (×2): qty 30, 30d supply, fill #6
  Filled 2024-07-24 – 2024-07-25 (×2): qty 30, 30d supply, fill #7
  Filled 2024-08-21: qty 30, 30d supply, fill #8

## 2023-09-22 MED ORDER — ONDANSETRON HCL 4 MG PO TABS
4.0000 mg | ORAL_TABLET | Freq: Three times a day (TID) | ORAL | 0 refills | Status: DC | PRN
  Filled 2023-09-22: qty 20, 7d supply, fill #0

## 2023-09-22 MED ORDER — ERGOCALCIFEROL 1.25 MG (50000 UT) PO CAPS
1.0000 | ORAL_CAPSULE | ORAL | 2 refills | Status: DC
  Filled 2023-09-22 – 2023-10-14 (×2): qty 4, 28d supply, fill #0
  Filled 2023-11-05 – 2023-11-23 (×2): qty 4, 28d supply, fill #1
  Filled 2024-02-16: qty 4, 28d supply, fill #2

## 2023-09-22 MED ORDER — VALSARTAN 80 MG PO TABS
80.0000 mg | ORAL_TABLET | Freq: Every day | ORAL | 3 refills | Status: DC
  Filled 2023-09-22: qty 90, 90d supply, fill #0
  Filled 2023-10-14: qty 30, 30d supply, fill #0
  Filled 2023-11-05: qty 30, 30d supply, fill #1

## 2023-09-22 MED ORDER — ATORVASTATIN CALCIUM 40 MG PO TABS
40.0000 mg | ORAL_TABLET | Freq: Every day | ORAL | 3 refills | Status: DC
  Filled 2023-09-22: qty 90, 90d supply, fill #0
  Filled 2023-10-14: qty 30, 30d supply, fill #0
  Filled 2023-11-05 – 2024-01-03 (×3): qty 30, 30d supply, fill #1
  Filled 2024-01-24 – 2024-01-26 (×2): qty 30, 30d supply, fill #2
  Filled 2024-02-14 – 2024-02-15 (×2): qty 30, 30d supply, fill #3
  Filled 2024-02-23 – 2024-03-15 (×4): qty 30, 30d supply, fill #4
  Filled ????-??-??: fill #4

## 2023-09-22 MED ORDER — SEMAGLUTIDE (2 MG/DOSE) 8 MG/3ML ~~LOC~~ SOPN
2.0000 mg | PEN_INJECTOR | SUBCUTANEOUS | 2 refills | Status: DC
Start: 2023-09-22 — End: 2023-11-05
  Filled 2023-09-22: qty 3, 28d supply, fill #0
  Filled 2023-10-04 – 2023-10-19 (×2): qty 9, 84d supply, fill #0

## 2023-09-22 MED ORDER — TRAZODONE HCL 100 MG PO TABS
100.0000 mg | ORAL_TABLET | Freq: Every evening | ORAL | 5 refills | Status: DC | PRN
  Filled 2023-09-22 – 2023-10-14 (×2): qty 30, 30d supply, fill #0
  Filled 2023-11-05 – 2023-11-23 (×2): qty 30, 30d supply, fill #1
  Filled 2024-01-26: qty 30, 30d supply, fill #2
  Filled 2024-02-14 – 2024-02-15 (×2): qty 30, 30d supply, fill #3

## 2023-09-22 NOTE — Progress Notes (Signed)
Virtual Visit via Video Note  I connected with Prescilla Sours on 09/22/23 at  1:00 PM EDT by a video enabled telemedicine application and verified that I am speaking with the correct person using two identifiers.  Location: Patient: home Provider: office   I discussed the limitations of evaluation and management by telemedicine and the availability of in person appointments. The patient expressed understanding and agreed to proceed.  History of Present Illness:  Patient presents today through video visit for a follow-up visit.  She states that she continues to have issues with GERD.  She is not currently on any reflux medication.  We will trial omeprazole.  Patient has noticed increased diarrhea and is concerned that it may be related to Ozempic.  Consult with pharmacy on this.  Patient will need to return next week for blood work. Denies f/c/s, n/v/d, hemoptysis, PND, leg swelling Denies chest pain or edema     Observations/Objective:     09/22/2023    1:03 PM 09/06/2023    2:41 PM 09/06/2023    1:39 PM  Vitals with BMI  Height 6\' 0"   6\' 0"   Weight 260 lbs  260 lbs  BMI 35.25  35.25  Systolic  158 146  Diastolic  161 89  Pulse  102 103      Assessment and Plan:  1. Gastroesophageal reflux disease without esophagitis  - omeprazole (PRILOSEC) 20 MG capsule; Take 1 capsule (20 mg total) by mouth daily.  Dispense: 30 capsule; Refill: 3 - CBC; Future - Comprehensive metabolic panel; Future - Hemoglobin A1c; Future  2. Type 2 diabetes mellitus with hyperglycemia, without long-term current use of insulin (HCC)  - CBC; Future - Comprehensive metabolic panel; Future - Hemoglobin A1c; Future - Semaglutide, 2 MG/DOSE, 8 MG/3ML SOPN; Inject 2 mg as directed once a week.  Dispense: 3 mL; Refill: 2  3. Severe episode of recurrent major depressive disorder, without psychotic features (HCC)  - buPROPion (WELLBUTRIN XL) 150 MG 24 hr tablet; Take 1 tablet (150 mg total) by mouth every  morning.  Dispense: 30 tablet; Refill: 1  4. Vitamin D deficiency  - ergocalciferol (VITAMIN D2) 1.25 MG (50000 UT) capsule; Take 1 capsule by mouth once a week.  Dispense: 4 capsule; Refill: 2  Follow up:  Follow up in 3 months      I discussed the assessment and treatment plan with the patient. The patient was provided an opportunity to ask questions and all were answered. The patient agreed with the plan and demonstrated an understanding of the instructions.   The patient was advised to call back or seek an in-person evaluation if the symptoms worsen or if the condition fails to improve as anticipated.  I provided 23 minutes of non-face-to-face time during this encounter.   Ivonne Andrew, NP

## 2023-09-22 NOTE — Patient Instructions (Signed)
1. Gastroesophageal reflux disease without esophagitis  - omeprazole (PRILOSEC) 20 MG capsule; Take 1 capsule (20 mg total) by mouth daily.  Dispense: 30 capsule; Refill: 3 - CBC; Future - Comprehensive metabolic panel; Future - Hemoglobin A1c; Future  2. Type 2 diabetes mellitus with hyperglycemia, without long-term current use of insulin (HCC)  - CBC; Future - Comprehensive metabolic panel; Future - Hemoglobin A1c; Future - Semaglutide, 2 MG/DOSE, 8 MG/3ML SOPN; Inject 2 mg as directed once a week.  Dispense: 3 mL; Refill: 2  3. Severe episode of recurrent major depressive disorder, without psychotic features (HCC)  - buPROPion (WELLBUTRIN XL) 150 MG 24 hr tablet; Take 1 tablet (150 mg total) by mouth every morning.  Dispense: 30 tablet; Refill: 1  4. Vitamin D deficiency  - ergocalciferol (VITAMIN D2) 1.25 MG (50000 UT) capsule; Take 1 capsule by mouth once a week.  Dispense: 4 capsule; Refill: 2  Follow up:  Follow up in 3 months

## 2023-09-29 ENCOUNTER — Other Ambulatory Visit: Payer: Self-pay

## 2023-09-30 ENCOUNTER — Other Ambulatory Visit (HOSPITAL_COMMUNITY): Payer: Self-pay

## 2023-09-30 ENCOUNTER — Other Ambulatory Visit (INDEPENDENT_AMBULATORY_CARE_PROVIDER_SITE_OTHER): Payer: Self-pay | Admitting: Pharmacist

## 2023-09-30 DIAGNOSIS — E1165 Type 2 diabetes mellitus with hyperglycemia: Secondary | ICD-10-CM

## 2023-09-30 MED ORDER — METFORMIN HCL ER 500 MG PO TB24
1000.0000 mg | ORAL_TABLET | Freq: Two times a day (BID) | ORAL | 1 refills | Status: DC
  Filled 2023-09-30: qty 180, 90d supply, fill #0
  Filled 2023-10-14: qty 120, 30d supply, fill #0
  Filled 2023-11-05 – 2024-01-03 (×3): qty 120, 30d supply, fill #1
  Filled 2024-01-24 – 2024-01-26 (×2): qty 120, 30d supply, fill #2
  Filled 2024-02-14 – 2024-02-15 (×2): qty 120, 30d supply, fill #3
  Filled 2024-02-23 – 2024-04-13 (×5): qty 120, 30d supply, fill #4
  Filled 2024-05-01 – 2024-05-11 (×2): qty 120, 30d supply, fill #5

## 2023-09-30 NOTE — Progress Notes (Signed)
09/30/2023 Name: Erica [name] MRN: [id number] DOB: 09/29/1971  Chief Complaint  Patient presents with   Medication Management   Diabetes   Hypertension    Abiha Lukehart is a 52 y.o. year old female who presented for a telephone visit.   They were referred to the pharmacist by their PCP for assistance in managing diabetes and hypertension.    Subjective:  Care Team: Primary Care Provider: Ivonne Andrew, NP ; Next Scheduled Visit: 02/08/24  Medication Access/Adherence  Current Pharmacy:  [facility]- [place], [place] - [address] [place] [address] Phone: [phone] Fax: [phone]  [facility] - [facility] [address] [place] [address] Phone: [phone] Fax: [phone]  [facility] [address] [place] [address] Phone: [phone] Fax: [phone]   Patient reports affordability concerns with their medications: No  Patient reports access/transportation concerns to their pharmacy: No  Patient reports adherence concerns with their medications:  Yes  - reports sometimes having a hard time remembering to take medications. Asks about adherence packaging from [employer].     Diabetes:  Current medications: metformin 1000 mg twice daily, Ozempic 2 mg weekly  Reports she has a diagnosis of IBS, s/p cholecystectomy. Reports diarrhea now.   Current glucose readings: not checking consistently.    Hypertension:  Current medications: valsartan 80 mg daily - realized today she does not have this  Patient does not have a validated, automated, upper arm home BP cuff  Hyperlipidemia/ASCVD Risk Reduction  Current lipid lowering medications: atorvastatin 40 mg daily  Antiplatelet regimen: aspirin 81 mg daily    Objective:  Lab Results  Component Value Date   HGBA1C 10.7 (A) 05/31/2023    Lab Results   Component Value Date   CREATININE 0.67 05/31/2023   BUN 5 (L) 05/31/2023   NA 138 05/31/2023   K 4.1 05/31/2023   CL 100 05/31/2023   CO2 23 05/31/2023    Lab Results  Component Value Date   CHOL 155 02/17/2023   HDL 39 (L) 02/17/2023   LDLCALC 85 02/17/2023   TRIG 178 (H) 02/17/2023   CHOLHDL 4.0 02/17/2023    Medications Reviewed Today     Reviewed by Alden Hipp, RPH-CPP (Pharmacist) on 09/30/23 at 1550  Med List Status: <None>   Medication Order Taking? Sig Documenting Provider Last Dose Status Informant  Accu-Chek Softclix Lancets lancets [id number]  4 (four) times daily.  Patient not taking: Reported on 05/31/2023   [provider]  Active     Discontinued 11/10/20 1306            Med Note Rocco Pauls Nov 10, 2020 12:39 PM) Pt does not take  aspirin 81 MG tablet [id number] Yes Take 81 mg by mouth at bedtime.  [provider] Taking Active Self  atorvastatin (LIPITOR) 40 MG tablet [id number] Yes Take 1 tablet (40 mg total) by mouth daily. Ivonne Andrew, NP Taking Active   blood glucose meter kit and supplies KIT [id number]  Dispense based on patient and insurance preference. Use up to four times daily as directed. Ivonne Andrew, NP  Active   Blood Glucose Monitoring Suppl (TRUE METRIX METER) w/Device KIT [id number]  Use to check blood sugar up to twice daily. Ivonne Andrew, NP  Active   buPROPion (WELLBUTRIN XL) 150 MG 24 hr tablet [id number] Yes Take 1  tablet (150 mg total) by mouth every morning. Ivonne Andrew, NP Taking Active   ergocalciferol (VITAMIN D2) 1.25 MG (50000 UT) capsule [id number] Yes Take 1 capsule by mouth once a week. Ivonne Andrew, NP Taking Active   escitalopram (LEXAPRO) 20 MG tablet [id number] Yes Take 1 tablet (20 mg total) by mouth daily. Ivonne Andrew, NP Taking Active   Fluocinolone Acetonide Body 0.01 % OIL [id number] Yes Apply to scalp 2 times daily for 2 weeks, then once daily for 2 weeks, then as  needed thereafter. Ivonne Andrew, NP Taking Active     Discontinued 11/10/20 1306            Med Note Oneta Rack, Doreene Burke Nov 10, 2020 12:38 PM) Pt reports she does not take  Glucose Blood (BLOOD GLUCOSE TEST STRIPS) STRP [id number]  Use to check blood sugar up to twice daily. Ivonne Andrew, NP  Active   Lancet Device MISC [id number]  Use to check blood sugar up to twice daily. May substitute to any manufacturer covered by patient's insurance. Ivonne Andrew, NP  Active   medroxyPROGESTERone Acetate 150 MG/ML SUSY [id number] Yes Inject 1 mL (150 mg total) into the muscle every 3 (three) months. Constant, Peggy, MD Taking Active   medroxyPROGESTERone Acetate 150 MG/ML SUSY [id number]  Inject 1 mL (150 mg total) into the muscle every 3 (three) months.  Patient not taking: Reported on 08/03/2023   Warden Fillers, MD  Active   Discontinued 09/30/23 1422   metFORMIN (GLUCOPHAGE-XR) 500 MG 24 hr tablet [id number] Yes Take 1 tablet (500 mg total) by mouth 2 (two) times daily with a meal. Ivonne Andrew, NP  Active   omeprazole (PRILOSEC) 20 MG capsule [id number] Yes Take 1 capsule (20 mg total) by mouth daily. Ivonne Andrew, NP Taking Active   ondansetron [facility]) 4 MG tablet [id number] Yes Take 1 tablet (4 mg total) by mouth every 8 (eight) hours as needed for nausea or vomiting. Ivonne Andrew, NP Taking Active   pregabalin (LYRICA) 150 MG capsule [id number] Yes Take 1 capsule (150 mg total) by mouth 2 (two) times daily. Mayers, Kasandra Knudsen, PA-C Taking Active   Semaglutide, 2 MG/DOSE, 8 MG/3ML SOPN [id number] Yes Inject 2 mg as directed once a week. Ivonne Andrew, NP Taking Active   traZODone (DESYREL) 100 MG tablet [id number] Yes Take 1 tablet (100 mg total) by mouth at bedtime as needed for sleep Ivonne Andrew, NP Taking Active   TRUEplus Lancets 28G MISC [id number]  Use as directed 2 (two) times daily at 8 am and 10 pm. Mayers, Kasandra Knudsen, PA-C  Active   TRUEplus Lancets 28G MISC [id number]   Use to check blood sugar up to twice daily. Ivonne Andrew, NP  Active   valsartan (DIOVAN) 80 MG tablet [id number]  Take 1 tablet (80 mg total) by mouth daily. Ivonne Andrew, NP  Active               Assessment/Plan:   Will collaborate with adherence packaging team at [employer].   Diabetes: - Currently uncontrolled, but anticipate improvement with adherence - Anticipate diarrhea to be related to metformin. Discussed transitioning to metformin XR 1000 mg twice daily. Patient is amenable.  - Recommend to continue Ozempic 2 mg weekly - Recommend to check glucose twice daily, fasting and 2 hour post prandial   Hypertension: - Currently uncontrolled - Reviewed long term cardiovascular and renal outcomes of  uncontrolled blood pressure - Reviewed appropriate blood pressure monitoring technique and reviewed goal blood pressure. Recommended to check home blood pressure and heart rate periodically. The patient has a diagnosis of hypertension and it is medically necessary for them to have access to a home device to monitor blood pressure. The patient does not have readily available insurance access to a device and cannot afford to purchase a device at this time. The patient has been counseled that they do not need to continue to receive services from [facility] for receive a device. The patient will be given a device free of charge.  - Recommend to restart valsartan.   Hyperlipidemia/ASCVD Risk Reduction: - Currently uncontrolled but adherence packaging likely to help improve lipids - Recommend to continue current regimen at this time   Follow Up Plan: follow up in 4 weeks  Catie Eppie Gibson, PharmD, BCACP, CPP Clinical Pharmacist [facility] [phone]

## 2023-09-30 NOTE — Patient Instructions (Signed)
Wilhemina,   It was great talking to you today!  The team from the pharmacy will be in touch about the adherence packaging.   Please reach out with any questions or concerns!  Catie Eppie Gibson, PharmD, BCACP, CPP Clinical Pharmacist [facility] [phone]

## 2023-10-04 ENCOUNTER — Other Ambulatory Visit: Payer: Self-pay

## 2023-10-05 ENCOUNTER — Other Ambulatory Visit: Payer: Self-pay

## 2023-10-08 ENCOUNTER — Other Ambulatory Visit: Payer: Self-pay

## 2023-10-08 ENCOUNTER — Other Ambulatory Visit (HOSPITAL_COMMUNITY): Payer: Self-pay

## 2023-10-12 ENCOUNTER — Other Ambulatory Visit (HOSPITAL_COMMUNITY): Payer: Self-pay

## 2023-10-12 ENCOUNTER — Other Ambulatory Visit: Payer: Self-pay

## 2023-10-14 ENCOUNTER — Other Ambulatory Visit (HOSPITAL_COMMUNITY): Payer: Self-pay

## 2023-10-14 ENCOUNTER — Other Ambulatory Visit: Payer: Self-pay

## 2023-10-14 ENCOUNTER — Other Ambulatory Visit: Payer: Self-pay | Admitting: Nurse Practitioner

## 2023-10-14 DIAGNOSIS — E1165 Type 2 diabetes mellitus with hyperglycemia: Secondary | ICD-10-CM

## 2023-10-14 NOTE — Telephone Encounter (Signed)
Please advise K[facility] 

## 2023-10-15 ENCOUNTER — Other Ambulatory Visit: Payer: Self-pay

## 2023-10-15 MED ORDER — PREGABALIN 150 MG PO CAPS
150.0000 mg | ORAL_CAPSULE | Freq: Two times a day (BID) | ORAL | 1 refills | Status: DC
  Filled 2023-10-15: qty 60, 30d supply, fill #0
  Filled 2023-11-23: qty 60, 30d supply, fill #1

## 2023-10-19 ENCOUNTER — Other Ambulatory Visit: Payer: Self-pay

## 2023-10-20 ENCOUNTER — Other Ambulatory Visit: Payer: Self-pay

## 2023-11-03 NOTE — Progress Notes (Unsigned)
   11/03/2023 Name: Erica [name] MRN: [id number] DOB: [birth year]  No chief complaint on file.   Erica [name] is a 52 y.o. year old female who presented for a telephone visit.   They were referred to the pharmacist by their PCP for assistance in managing diabetes and hypertension.    Subjective:  Care Team: Primary Care Provider: Ivonne Andrew, NP ; Next Scheduled Visit: 02/08/2024  Medication Access/Adherence  Current Pharmacy:  [facility]- [place], [place] - [address] [place] [address] Phone: [phone] Fax: [phone]  [facility] - [facility] [address] [place] [address] Phone: [phone] Fax: [phone]  [facility] [address] [place] [address] Phone: [phone] Fax: [phone]   Patient reports affordability concerns with their medications: {YES/NO:21197} Patient reports access/transportation concerns to their pharmacy: {YES/NO:21197} Patient reports adherence concerns with their medications:  {YES/NO:21197} ***   {Pharmacy S/O Choices:26420}   Objective:  Lab Results  Component Value Date   HGBA1C 10.7 (A) 05/31/2023    Lab Results  Component Value Date   CREATININE 0.67 05/31/2023   BUN 5 (L) 05/31/2023   NA 138 05/31/2023   K 4.1 05/31/2023   CL 100 05/31/2023   CO2 23 05/31/2023    Lab Results  Component Value Date   CHOL 155 02/17/2023   HDL 39 (L) 02/17/2023   LDLCALC 85 02/17/2023   TRIG 178 (H) 02/17/2023   CHOLHDL 4.0 02/17/2023    Medications Reviewed Today   Medications were not reviewed in this encounter       Assessment/Plan:   {Pharmacy A/P Choices:26421}  Follow Up Plan: ***  ***

## 2023-11-04 ENCOUNTER — Other Ambulatory Visit: Payer: No Typology Code available for payment source | Admitting: Pharmacist

## 2023-11-04 ENCOUNTER — Other Ambulatory Visit: Payer: Self-pay

## 2023-11-04 DIAGNOSIS — E785 Hyperlipidemia, unspecified: Secondary | ICD-10-CM

## 2023-11-04 DIAGNOSIS — E1165 Type 2 diabetes mellitus with hyperglycemia: Secondary | ICD-10-CM

## 2023-11-04 NOTE — Patient Instructions (Signed)
Ms. Entler,   It was great talking to you today!  Continue your current regimen.   Check your blood pressure once weekly, and any time you have concerning symptoms like headache, chest pain, dizziness, shortness of breath, or vision changes.   Our goal is less than 130/80.  To appropriately check your blood pressure, make sure you do the following:  1) Avoid caffeine, exercise, or tobacco products for 30 minutes before checking. Empty your bladder. 2) Sit with your back supported in a flat-backed chair. Rest your arm on something flat (arm of the chair, table, etc). 3) Sit still with your feet flat on the floor, resting, for at least 5 minutes.  4) Check your blood pressure. Take 1-2 readings.  5) Write down these readings and bring with you to any provider appointments.  Bring your home blood pressure machine with you to a provider's office for accuracy comparison at least once a year.   Make sure you take your blood pressure medications before you come to any office visit, even if you were asked to fast for labs.   Please send me some home readings in a MyChart message later in December.   Thanks!  Catie Eppie Gibson, PharmD, BCACP, CPP Clinical Pharmacist [facility] [phone]

## 2023-11-04 NOTE — Progress Notes (Signed)
11/04/2023 Name: Erica [name] MRN: [id number] DOB: 1971-03-13  Chief Complaint  Patient presents with   Diabetes   Hypertension   Hyperlipidemia    Erica [name] is a 52 y.o. year old female who presented for a telephone visit.   They were referred to the pharmacist by their PCP for assistance in managing diabetes and hypertension.    Subjective:  Care Team: Primary Care Provider: Ivonne Andrew, NP ; Next Scheduled Visit:   Medication Access/Adherence  Current Pharmacy:  [facility]- [place], [place] - [address] [place] [address] Phone: [phone] Fax: [phone]  [facility] [address], [address] [place] [address] Phone: [phone] Fax: [phone]  [facility] [address] [place] [address] Phone: [phone] Fax: [phone]   Diabetes:  Current medications: metformin 1000 mg twice daily, Ozempic 2 mg weekly   Reports some constipation with Ozempic but it's tolerable   Current glucose readings: doesn't have any readings  Using glucose meter; but doesn't measure frequently  Patient denies hypoglycemic s/sx including dizziness, shakiness, sweating. Patient denies hyperglycemic symptoms including polyuria, polydipsia, polyphagia, nocturia, neuropathy, blurred vision.  Current meal patterns:  - Drinks: a lot of regular soda (Pepsi) - Eats a lot of sugar   Current physical activity: not currently - wants to join a gym and do some cardio  Hypertension:  Current medications: valsartan 80 mg daily   Patient does not have a validated, automated, upper arm home BP cuff  Patient denies hypotensive s/sx including dizziness, lightheadedness.  Patient denies hypertensive symptoms including headache, chest pain, shortness of breath  Hyperlipidemia/ASCVD Risk Reduction  Current lipid lowering  medications: atorvastatin 40 mg daily   Antiplatelet regimen: aspirin 81 mg daily   ASCVD History: none Family History: HTN (mother) Risk Factors: DM, HLD   The 10-year ASCVD risk score (Arnett DK, et al., 2019) is: 22.6%   Values used to calculate the score:     Age: 52 years     Sex: Female     Is Non-Hispanic African American: Yes     Diabetic: Yes     Tobacco smoker: No     Systolic Blood Pressure: 158 mmHg     Is BP treated: Yes     HDL Cholesterol: 39 mg/dL     Total Cholesterol: 155 mg/dL    Objective:  Lab Results  Component Value Date   HGBA1C 10.7 (A) 05/31/2023    Lab Results  Component Value Date   CREATININE 0.67 05/31/2023   BUN 5 (L) 05/31/2023   NA 138 05/31/2023   K 4.1 05/31/2023   CL 100 05/31/2023   CO2 23 05/31/2023    Lab Results  Component Value Date   CHOL 155 02/17/2023   HDL 39 (L) 02/17/2023   LDLCALC 85 02/17/2023   TRIG 178 (H) 02/17/2023   CHOLHDL 4.0 02/17/2023    Medications Reviewed Today     Reviewed by Roslyn Smiling, [facility] (Pharmacist) on 11/04/23 at 1329  Med List Status: <None>   Medication Order Taking? Sig Documenting Provider Last Dose Status Informant  Accu-Chek Softclix Lancets lancets [id number]  4 (four) times daily.  Patient not taking: Reported on 05/31/2023   [provider]  Active     Discontinued 11/10/20 1306            Med Note Oneta Rack, Doreene Burke Nov 10, 2020 12:39 PM) Pt does not take  aspirin 81 MG tablet [id number] Yes Take 81 mg by mouth at bedtime.  [provider] Taking Active Self  atorvastatin (LIPITOR) 40 MG tablet [id number] Yes Take 1 tablet (40 mg total) by mouth daily. Ivonne Andrew, NP Taking Active   blood glucose meter kit and supplies KIT [id number]  Dispense based on patient and insurance preference. Use up to four times daily as directed. Ivonne Andrew, NP  Active   Blood Glucose Monitoring Suppl (TRUE METRIX METER) w/Device KIT [id number]  Use to check blood sugar  up to twice daily. Ivonne Andrew, NP  Active   buPROPion (WELLBUTRIN XL) 150 MG 24 hr tablet [id number] Yes Take 1 tablet (150 mg total) by mouth every morning. Ivonne Andrew, NP Taking Active   ergocalciferol (VITAMIN D2) 1.25 MG (50000 UT) capsule [id number] Yes Take 1 capsule by mouth once a week. Ivonne Andrew, NP Taking Active   escitalopram (LEXAPRO) 20 MG tablet [id number] Yes Take 1 tablet (20 mg total) by mouth daily. Ivonne Andrew, NP Taking Active   Fluocinolone Acetonide Body 0.01 % OIL [id number] Yes Apply to scalp 2 times daily for 2 weeks, then once daily for 2 weeks, then as needed thereafter. Ivonne Andrew, NP Taking Active     Discontinued 11/10/20 1306            Med Note Oneta Rack, Doreene Burke Nov 10, 2020 12:38 PM) Pt reports she does not take  Glucose Blood (BLOOD GLUCOSE TEST STRIPS) STRP [id number]  Use to check blood sugar up to twice daily. Ivonne Andrew, NP  Active   Lancet Device MISC [id number]  Use to check blood sugar up to twice daily. May substitute to any manufacturer covered by patient's insurance. Ivonne Andrew, NP  Active   medroxyPROGESTERone Acetate 150 MG/ML SUSY [id number] Yes Inject 1 mL (150 mg total) into the muscle every 3 (three) months. Constant, Peggy, MD Taking Active   medroxyPROGESTERone Acetate 150 MG/ML SUSY [id number]  Inject 1 mL (150 mg total) into the muscle every 3 (three) months.  Patient not taking: Reported on 08/03/2023   Warden Fillers, MD  Active     Discontinued 09/30/23 1422   metFORMIN (GLUCOPHAGE-XR) 500 MG 24 hr tablet [id number] Yes Take 2 tablets (1,000 mg total) by mouth 2 (two) times daily with a meal. Ivonne Andrew, NP Taking Active   omeprazole (PRILOSEC) 20 MG capsule [id number] Yes Take 1 capsule (20 mg total) by mouth daily. Ivonne Andrew, NP Taking Active   ondansetron [facility]) 4 MG tablet [id number] Yes Take 1 tablet (4 mg total) by mouth every 8 (eight) hours as needed for nausea or vomiting. Ivonne Andrew, NP Taking Active   pregabalin (LYRICA) 150 MG capsule [id number] Yes Take 1 capsule (150 mg total) by mouth 2 (two) times daily. Ivonne Andrew, NP Taking Active   Semaglutide, 2 MG/DOSE, 8 MG/3ML SOPN [id number] Yes Inject 2 mg as directed once a week. Ivonne Andrew, NP Taking Active   traZODone (DESYREL) 100 MG tablet [id number] Yes Take 1 tablet (100 mg total) by mouth at bedtime as needed for sleep Ivonne Andrew, NP Taking Active   TRUEplus Lancets 28G MISC [id number]  Use as directed 2 (two) times daily at 8 am and 10 pm. Roney Jaffe, PA-C  Active   TRUEplus Lancets 28G MISC [id number]  Use to check blood sugar  up to twice daily. Ivonne Andrew, NP  Active   valsartan (DIOVAN) 80 MG tablet [id number] Yes Take 1 tablet (80 mg total) by mouth daily. Ivonne Andrew, NP Taking Active             Assessment/Plan:   Diabetes: - Currently uncontrolled based on last A1c of 10.7% (goal <7%) - Reviewed goal A1c, goal fasting, and goal 2 hour post prandial glucose - Reviewed dietary modifications including reducing regular soda intake (recommended going back to one a day) - Reviewed lifestyle modifications including: joining a gym  - Recommend to continue current regimen  - Recommend to check glucose periodically for now (but ideally daily)   Hypertension: - Currently uncontrolled based on last BP readings of 140s/70-80s mmHg (goal <130/80 mmHg) - Reviewed long term cardiovascular and renal outcomes of uncontrolled blood pressure - Reviewed appropriate blood pressure monitoring technique and reviewed goal blood pressure. Recommended to check home blood pressure and heart rate periodically for now  - BP cuff left at front office and patient plans to have friend pick up for her - Recommend to continue current regimen   Hyperlipidemia/ASCVD Risk Reduction: - Currently uncontrolled based on last LDL of 85 (goal LDL <70)  - Recommend to current regimen    Follow Up Plan:  12/23/2023 with pharmacist via telephone   Roslyn Smiling, PharmD PGY1 Pharmacy Resident 11/04/2023 1:47 PM   I have reviewed the pharmacist's encounter and agree with their documentation.   Catie Eppie Gibson, PharmD, BCACP, CPP [facility] [phone]

## 2023-11-05 ENCOUNTER — Other Ambulatory Visit: Payer: Self-pay | Admitting: Nurse Practitioner

## 2023-11-05 ENCOUNTER — Other Ambulatory Visit (HOSPITAL_COMMUNITY): Payer: Self-pay

## 2023-11-05 ENCOUNTER — Other Ambulatory Visit: Payer: Self-pay

## 2023-11-05 DIAGNOSIS — E1165 Type 2 diabetes mellitus with hyperglycemia: Secondary | ICD-10-CM

## 2023-11-05 MED ORDER — OZEMPIC (2 MG/DOSE) 8 MG/3ML ~~LOC~~ SOPN
2.0000 mg | PEN_INJECTOR | SUBCUTANEOUS | 2 refills | Status: DC
  Filled 2023-11-05: qty 3, 28d supply, fill #0
  Filled 2024-01-10: qty 9, 84d supply, fill #0

## 2023-11-09 ENCOUNTER — Other Ambulatory Visit: Payer: Self-pay

## 2023-11-15 ENCOUNTER — Other Ambulatory Visit (HOSPITAL_COMMUNITY): Payer: Self-pay

## 2023-11-15 ENCOUNTER — Other Ambulatory Visit: Payer: Self-pay | Admitting: Obstetrics & Gynecology

## 2023-11-15 ENCOUNTER — Other Ambulatory Visit: Payer: Self-pay

## 2023-11-15 ENCOUNTER — Telehealth: Payer: Self-pay | Admitting: *Deleted

## 2023-11-15 DIAGNOSIS — N939 Abnormal uterine and vaginal bleeding, unspecified: Secondary | ICD-10-CM

## 2023-11-15 MED ORDER — MEDROXYPROGESTERONE ACETATE 10 MG PO TABS
10.0000 mg | ORAL_TABLET | Freq: Every day | ORAL | 1 refills | Status: DC
Start: 2023-11-15 — End: 2024-06-29
  Filled 2023-11-15: qty 14, 14d supply, fill #0
  Filled 2024-05-31: qty 14, 14d supply, fill #1

## 2023-11-15 NOTE — Telephone Encounter (Signed)
Pt called to office stating she has been bleeding x 2 weeks with Depo. Pt isn't due for her next injection until next week.  Pt would like to know if there is anything else she can take to stop the bleeding until she gets her next injection.   Please advise.

## 2023-11-15 NOTE — Progress Notes (Signed)
Meds ordered this encounter  Medications   medroxyPROGESTERone (PROVERA) 10 MG tablet    Sig: Take 1 tablet (10 mg total) by mouth daily.    Dispense:  14 tablet    Refill:  1

## 2023-11-16 ENCOUNTER — Other Ambulatory Visit (HOSPITAL_COMMUNITY): Payer: Self-pay

## 2023-11-16 ENCOUNTER — Other Ambulatory Visit: Payer: Self-pay

## 2023-11-22 ENCOUNTER — Emergency Department (HOSPITAL_BASED_OUTPATIENT_CLINIC_OR_DEPARTMENT_OTHER): Payer: No Typology Code available for payment source

## 2023-11-22 ENCOUNTER — Emergency Department (HOSPITAL_BASED_OUTPATIENT_CLINIC_OR_DEPARTMENT_OTHER)
Admission: EM | Admit: 2023-11-22 | Discharge: 2023-11-22 | Disposition: A | Discharge status: Home / Self Care | Payer: No Typology Code available for payment source

## 2023-11-22 ENCOUNTER — Other Ambulatory Visit: Payer: Self-pay

## 2023-11-22 ENCOUNTER — Encounter (HOSPITAL_BASED_OUTPATIENT_CLINIC_OR_DEPARTMENT_OTHER): Payer: Self-pay

## 2023-11-22 DIAGNOSIS — E1140 Type 2 diabetes mellitus with diabetic neuropathy, unspecified: Secondary | ICD-10-CM | POA: Insufficient documentation

## 2023-11-22 DIAGNOSIS — Z7984 Long term (current) use of oral hypoglycemic drugs: Secondary | ICD-10-CM | POA: Insufficient documentation

## 2023-11-22 DIAGNOSIS — M79672 Pain in left foot: Secondary | ICD-10-CM | POA: Insufficient documentation

## 2023-11-22 DIAGNOSIS — R42 Dizziness and giddiness: Secondary | ICD-10-CM

## 2023-11-22 DIAGNOSIS — R202 Paresthesia of skin: Secondary | ICD-10-CM | POA: Insufficient documentation

## 2023-11-22 DIAGNOSIS — M79671 Pain in right foot: Secondary | ICD-10-CM

## 2023-11-22 DIAGNOSIS — Z794 Long term (current) use of insulin: Secondary | ICD-10-CM | POA: Insufficient documentation

## 2023-11-22 DIAGNOSIS — Z7982 Long term (current) use of aspirin: Secondary | ICD-10-CM | POA: Insufficient documentation

## 2023-11-22 LAB — COMPREHENSIVE METABOLIC PANEL
ALT: 20 U/L (ref 0–44)
AST: 23 U/L (ref 15–41)
Albumin: 4.1 g/dL (ref 3.5–5.0)
Alkaline Phosphatase: 80 U/L (ref 38–126)
Anion gap: 11 (ref 5–15)
BUN: 6 mg/dL (ref 6–20)
CO2: 23 mmol/L (ref 22–32)
Calcium: 9.8 mg/dL (ref 8.9–10.3)
Chloride: 101 mmol/L (ref 98–111)
Creatinine, Ser: 0.66 mg/dL (ref 0.44–1.00)
GFR, Estimated: >60 mL/min (ref >60)
Glucose, Bld: 277 mg/dL — ABNORMAL HIGH (ref 70–99)
Potassium: 4.2 mmol/L (ref 3.5–5.1)
Sodium: 135 mmol/L (ref 135–145)
Total Bilirubin: 0.6 mg/dL (ref <1.2)
Total Protein: 7.3 g/dL (ref 6.5–8.1)

## 2023-11-22 LAB — CBC WITH DIFFERENTIAL/PLATELET
Abs Immature Granulocytes: 0.02 10*3/uL (ref 0.00–0.07)
Basophils Absolute: 0 10*3/uL (ref 0.0–0.1)
Basophils Relative: 1 %
Eosinophils Absolute: 0.1 10*3/uL (ref 0.0–0.5)
Eosinophils Relative: 2 %
HCT: 42.1 % (ref 36.0–46.0)
Hemoglobin: 13.9 g/dL (ref 12.0–15.0)
Immature Granulocytes: 0 %
Lymphocytes Relative: 37 %
Lymphs Abs: 2.3 10*3/uL (ref 0.7–4.0)
MCH: 28.4 pg (ref 26.0–34.0)
MCHC: 33 g/dL (ref 30.0–36.0)
MCV: 85.9 fL (ref 80.0–100.0)
Monocytes Absolute: 0.4 10*3/uL (ref 0.1–1.0)
Monocytes Relative: 6 %
Neutro Abs: 3.5 10*3/uL (ref 1.7–7.7)
Neutrophils Relative %: 54 %
Platelets: 346 10*3/uL (ref 150–400)
RBC: 4.9 MIL/uL (ref 3.87–5.11)
RDW: 12.2 % (ref 11.5–15.5)
WBC: 6.4 10*3/uL (ref 4.0–10.5)
nRBC: 0 % (ref 0.0–0.2)

## 2023-11-22 LAB — CBG MONITORING, ED: Glucose-Capillary: 273 mg/dL — ABNORMAL HIGH (ref 70–99)

## 2023-11-22 MED ORDER — DIAZEPAM 5 MG/ML IJ SOLN
2.5000 mg | Freq: Once | INTRAMUSCULAR | Status: AC
  Administered 2023-11-22: 2.5 mg via INTRAVENOUS
  Filled 2023-11-22: qty 2

## 2023-11-22 MED ORDER — MECLIZINE HCL 25 MG PO TABS
25.0000 mg | ORAL_TABLET | Freq: Three times a day (TID) | ORAL | 0 refills | Status: DC | PRN
  Filled 2023-11-22: qty 30, 10d supply, fill #0

## 2023-11-22 MED ORDER — OXYCODONE-ACETAMINOPHEN 5-325 MG PO TABS
2.0000 | ORAL_TABLET | Freq: Once | ORAL | Status: AC
  Administered 2023-11-22: 2 via ORAL
  Filled 2023-11-22: qty 2

## 2023-11-22 MED ORDER — OXYCODONE-ACETAMINOPHEN 7.5-325 MG PO TABS
1.0000 | ORAL_TABLET | Freq: Four times a day (QID) | ORAL | 0 refills | Status: DC | PRN
  Filled 2023-11-22: qty 12, 3d supply, fill #0

## 2023-11-22 MED ORDER — MECLIZINE HCL 25 MG PO TABS
25.0000 mg | ORAL_TABLET | Freq: Once | ORAL | Status: AC
  Administered 2023-11-22: 25 mg via ORAL
  Filled 2023-11-22: qty 1

## 2023-11-22 MED ORDER — OXYCODONE-ACETAMINOPHEN 5-325 MG PO TABS
1.0000 | ORAL_TABLET | Freq: Once | ORAL | Status: AC
  Administered 2023-11-22: 1 via ORAL
  Filled 2023-11-22: qty 1

## 2023-11-22 MED ORDER — SODIUM CHLORIDE 0.9 % IV BOLUS
1000.0000 mL | Freq: Once | INTRAVENOUS | Status: AC
  Administered 2023-11-22: 1000 mL via INTRAVENOUS

## 2023-11-22 MED ORDER — KETOROLAC TROMETHAMINE 15 MG/ML IJ SOLN
15.0000 mg | Freq: Once | INTRAMUSCULAR | Status: AC
  Administered 2023-11-22: 15 mg via INTRAVENOUS
  Filled 2023-11-22: qty 1

## 2023-11-22 MED ORDER — SODIUM CHLORIDE 0.9 % IV BOLUS
500.0000 mL | Freq: Once | INTRAVENOUS | Status: AC
  Administered 2023-11-22: 500 mL via INTRAVENOUS

## 2023-11-22 NOTE — ED Notes (Signed)
Patient transported to CT 

## 2023-11-22 NOTE — ED Notes (Signed)
Pain persists, pt unable to ambulate at this time due to pain

## 2023-11-22 NOTE — ED Provider Notes (Signed)
[place] EMERGENCY DEPARTMENT AT [facility] Provider Note   CSN: [id number] Arrival date & time: 11/22/23  1315     History  Chief Complaint  Patient presents with   Dizziness   Foot Pain    R    Erica [name] is a 52 y.o. female.  52 year old female with past medical history of diabetes with peripheral neuropathy presenting to the emergency department today with dizziness and pain in her right foot.  The patient states that the dizziness started on Saturday.  She reports she has been having intermittent episodes of room spinning dizziness since then.  She reports this does seem to be getting a little bit better.  She states that it is mostly with movement but occasionally she will have the dizziness when she tries to stand.  She denies any associated nausea or vomiting.  Denies any focal weakness, numbness, or tingling.  The patient states she is also having pain in the lateral aspect of her right foot.  The patient states that she normally has peripheral neuropathy.  She normally has numbness in her feet as well as intermittent pain.  She states that she is been taking her Lyrica at home and this is not really helping.  She came to the ER at that time for further evaluation regarding this.  She denies any leg pain or swelling.  She came to the ER today for further evaluation due to these ongoing symptoms.   Dizziness Foot Pain       Home Medications Prior to Admission medications   Medication Sig Start Date End Date Taking? Authorizing Provider  meclizine (ANTIVERT) 25 MG tablet Take 1 tablet (25 mg total) by mouth 3 (three) times daily as needed for dizziness. 11/22/23  Yes Durwin Glaze, MD  oxyCODONE-acetaminophen (PERCOCET) 7.5-325 MG tablet Take 1 tablet by mouth every 6 (six) hours as needed for severe pain (pain score 7-10). 11/22/23  Yes Durwin Glaze, MD  Accu-Chek Softclix Lancets lancets 4 (four) times daily. Patient not taking: Reported on 05/31/2023 09/18/21    [provider]  aspirin 81 MG tablet Take 81 mg by mouth at bedtime.     [provider]  atorvastatin (LIPITOR) 40 MG tablet Take 1 tablet (40 mg total) by mouth daily. 09/22/23   Ivonne Ludell Zacarias, NP  blood glucose meter kit and supplies KIT Dispense based on patient and insurance preference. Use up to four times daily as directed. 05/31/23   Ivonne Adylene Dlugosz, NP  Blood Glucose Monitoring Suppl (TRUE METRIX METER) w/Device KIT Use to check blood sugar up to twice daily. 08/03/23   Ivonne Melaney Tellefsen, NP  buPROPion (WELLBUTRIN XL) 150 MG 24 hr tablet Take 1 tablet (150 mg total) by mouth every morning. 09/22/23   Ivonne Shakeia Krus, NP  ergocalciferol (VITAMIN D2) 1.25 MG (50000 UT) capsule Take 1 capsule by mouth once a week. 09/22/23   Ivonne Zephyr Ridley, NP  escitalopram (LEXAPRO) 20 MG tablet Take 1 tablet (20 mg total) by mouth daily. 09/22/23   Ivonne Nikitia Asbill, NP  Fluocinolone Acetonide Body 0.01 % OIL Apply to scalp 2 times daily for 2 weeks, then once daily for 2 weeks, then as needed thereafter. 05/31/23   Ivonne Ashaki Frosch, NP  Glucose Blood (BLOOD GLUCOSE TEST STRIPS) STRP Use to check blood sugar up to twice daily. 08/03/23   Ivonne Aavya Shafer, NP  Lancet Device MISC Use to check blood sugar up to twice daily. May substitute to  any manufacturer covered by [employer]. 08/03/23   Ivonne Wardell Pokorski, NP  medroxyPROGESTERone (PROVERA) 10 MG tablet Take 1 tablet (10 mg total) by mouth daily. 11/15/23   Adam Phenix, MD  medroxyPROGESTERone Acetate 150 MG/ML SUSY Inject 1 mL (150 mg total) into the muscle every 3 (three) months. 09/29/22   Constant, Peggy, MD  medroxyPROGESTERone Acetate 150 MG/ML SUSY Inject 1 mL (150 mg total) into the muscle every 3 (three) months. Patient not taking: Reported on 08/03/2023 03/29/23   Warden Fillers, MD  metFORMIN (GLUCOPHAGE-XR) 500 MG 24 hr tablet Take 2 tablets (1,000 mg total) by mouth 2 (two) times daily with a meal. 09/30/23   Ivonne Stephon Weathers, NP  omeprazole (PRILOSEC) 20 MG capsule Take 1 capsule (20 mg total) by mouth daily. 09/22/23   Ivonne Elek Holderness, NP  ondansetron (ZOFRAN) 4 MG tablet Take 1 tablet (4 mg total) by mouth every 8 (eight) hours as needed for nausea or vomiting. 09/22/23   Ivonne Deneane Stifter, NP  pregabalin (LYRICA) 150 MG capsule Take 1 capsule (150 mg total) by mouth 2 (two) times daily. 10/15/23   Ivonne Karene Bracken, NP  Semaglutide, 2 MG/DOSE, (OZEMPIC, 2 MG/DOSE,) 8 MG/3ML SOPN Inject 2 mg as directed once a week. 11/05/23   Ivonne Jakira Mcfadden, NP  traZODone (DESYREL) 100 MG tablet Take 1 tablet (100 mg total) by mouth at bedtime as needed for sleep 09/22/23   Ivonne Yandel Zeiner, NP  TRUEplus Lancets 28G MISC Use as directed 2 (two) times daily at 8 am and 10 pm. 02/16/23   Mayers, Cari S, PA-C  TRUEplus Lancets 28G MISC Use to check blood sugar up to twice daily. 08/03/23   Ivonne Nafeesah Lapaglia, NP  valsartan (DIOVAN) 80 MG tablet Take 1 tablet (80 mg total) by mouth daily. 09/22/23   Ivonne Izumi Mixon, NP  amitriptyline (ELAVIL) 50 MG tablet amitriptyline 50 mg tablet  Take 1 tablet(s) every day by oral route as directed for 30 days.  11/10/20  [provider]  gabapentin (NEURONTIN) 300 MG capsule Take 300 mg by mouth.  11/10/20  [provider]  metFORMIN (GLUCOPHAGE) 1000 MG tablet Take 1 tablet (1,000 mg total) by mouth 2 (two) times daily with a meal. 09/22/23   Ivonne Kollins Fenter, NP      Allergies    Penicillins    Review of Systems   Review of Systems  Musculoskeletal:        Right foot pain  Neurological:  Positive for dizziness.  All other systems reviewed and are negative.   Physical Exam Updated Vital Signs BP (!) 161/94   Pulse 95   Temp 97.7 F (36.5 C) (Oral)   Resp 14   SpO2 97%  Physical Exam Vitals and nursing note reviewed.   Gen: NAD Eyes: PERRL, EOMI HEENT: no oropharyngeal swelling Neck: trachea midline Resp: clear to auscultation bilaterally Card: RRR, no  murmurs, rubs, or gallops Abd: nontender, nondistended Extremities: no calf tenderness, no edema, the patient is tender over the fifth metatarsal with no obvious deformity noted I do not appreciate any overlying wounds Vascular: 2+ radial pulses bilaterally, 2+ DP pulses bilaterally, 2+ PT pulses bilaterally Neuro: NIH stroke scale of 0 Skin: no rashes Psyc: acting appropriately   ED Results / Procedures / Treatments   Labs (all labs ordered are listed, but only abnormal results are displayed) Labs Reviewed  COMPREHENSIVE METABOLIC PANEL - Abnormal; Notable for the following components:  Result Value   Glucose, Bld 277 (*)    All other components within normal limits  CBG MONITORING, ED - Abnormal; Notable for the following components:   Glucose-Capillary 273 (*)    All other components within normal limits  CBC WITH DIFFERENTIAL/PLATELET    EKG EKG Interpretation Date/Time:  Monday November 22 2023 16:22:42 EST Ventricular Rate:  102 PR Interval:  159 QRS Duration:  81 QT Interval:  350 QTC Calculation: 456 R Axis:   19  Text Interpretation: Sinus tachycardia Low voltage, precordial leads Confirmed by Beckey Downing [phone]) on 11/22/2023 4:39:41 PM  Radiology DG Foot 2 Views Right  Result Date: 11/22/2023 CLINICAL DATA:  Right lateral foot burning and tingling pain. No reported injury. EXAM: RIGHT FOOT - 2 VIEW COMPARISON:  None Available. FINDINGS: Large inferior calcaneal enthesophyte. Minimal posterior calcaneal enthesophyte formation. Hallux valgus deformity. No fracture or dislocation. IMPRESSION: 1. No acute abnormality. 2. Large inferior calcaneal enthesophyte. 3. Hallux valgus deformity. Electronically Signed   By: Beckie Salts M.D.   On: 11/22/2023 17:52   CT Head Wo Contrast  Result Date: 11/22/2023 CLINICAL DATA:  Neuro deficit, acute, stroke suspected. Severe dizziness over the weekend. EXAM: CT HEAD WITHOUT CONTRAST TECHNIQUE: Contiguous axial images were  obtained from the base of the skull through the vertex without intravenous contrast. RADIATION DOSE REDUCTION: This exam was performed according to the departmental dose-optimization program which includes automated exposure control, adjustment of the mA and/or kV according to patient size and/or use of iterative reconstruction technique. COMPARISON:  None Available. FINDINGS: Brain: No acute intracranial hemorrhage. Gray-white differentiation is preserved. No hydrocephalus or extra-axial collection. No mass effect or midline shift. Vascular: No hyperdense vessel or unexpected calcification. Skull: No calvarial fracture or suspicious bone lesion. Skull base is unremarkable. Sinuses/Orbits: No acute finding. Other: None. IMPRESSION: No acute intracranial abnormality. Electronically Signed   By: Orvan Falconer M.D.   On: 11/22/2023 17:39    Procedures Procedures    Medications Ordered in ED Medications  sodium chloride 0.9 % bolus 1,000 mL (0 mLs Intravenous Stopped 11/22/23 1829)  meclizine (ANTIVERT) tablet 25 mg (25 mg Oral Given 11/22/23 1644)  oxyCODONE-acetaminophen (PERCOCET/ROXICET) 5-325 MG per tablet 1 tablet (1 tablet Oral Given 11/22/23 1644)  ketorolac (TORADOL) 15 MG/ML injection 15 mg (15 mg Intravenous Given 11/22/23 1827)  diazepam (VALIUM) injection 2.5 mg (2.5 mg Intravenous Given 11/22/23 1827)  oxyCODONE-acetaminophen (PERCOCET/ROXICET) 5-325 MG per tablet 2 tablet (2 tablets Oral Given 11/22/23 1929)  sodium chloride 0.9 % bolus 500 mL (0 mLs Intravenous Stopped 11/22/23 2112)    ED Course/ Medical Decision Making/ A&P                                 Medical Decision Making 51 year old female with past medical history of diabetes and peripheral neuropathy presenting to the emergency department today with intermittent dizziness as well as pain in her right foot.  These do not seem to be temporally related.  I will further evaluate her here with basic labs to evaluate for  electrolyte abnormalities or anemia.  Will obtain a CT scan of her head to evaluate for intracranial hemorrhage or mass lesion.  Her exam is not indicative of peripheral neuropathy but she also does not have any significant ataxia.  I will give patient IV fluids as well as meclizine here.  She does not have any other symptoms consistent with CVA at this time.  Obtain an x-ray of her right foot to evaluate for occult injury as she does report some numbness in her foot at baseline is possible that she may have a bony injury.  This may be due to worsening peripheral neuropathy.  She is already on Lyrica.  I will give the patient Percocet for this.  Will also give her IV fluids in the event this due to dehydration.  I will reevaluate for ultimate disposition.  The patient's dizziness resolved here.  She was still having pain in the foot.  Her x-ray did not show any acute findings.  She does not have any overlying erythema or findings to suggest infectious process.  She is given pain medication with improvement.  She will be discharged with return precautions.  Amount and/or Complexity of Data Reviewed Labs: ordered. Radiology: ordered.  Risk Prescription drug management.           Final Clinical Impression(s) / ED Diagnoses Final diagnoses:  Dizziness  Right foot pain    Rx / DC Orders ED Discharge Orders          Ordered    oxyCODONE-acetaminophen (PERCOCET) 7.5-325 MG tablet  Every 6 hours PRN        11/22/23 2130    meclizine (ANTIVERT) 25 MG tablet  3 times daily PRN        11/22/23 2130              Durwin Glaze, MD 11/22/23 2131

## 2023-11-22 NOTE — ED Triage Notes (Signed)
Pt c/o "dizziness over the weekend really bad- like everything was spinning," Endorses "random R foot pain nonstop since last night," advises she is also on lyrica for same, "but it's not helping."   On ozympic, states that she has been "feeling nauseous since, so they changed my metformin about a month ago."   Hx DM, controlled.

## 2023-11-22 NOTE — ED Notes (Signed)
 RN reviewed discharge instructions with pt. Pt verbalized understanding and had no further questions. VSS upon discharge.  

## 2023-11-22 NOTE — Discharge Instructions (Signed)
Your workup here today was reassuring.  Please take the medications as prescribed.  Do not drive or drink alcohol while taking these as they may make you drowsy.  Please schedule follow-up appoint with your primary care provider.  If you have worsening symptoms please either go to [facility] or [facility] in the event that they need to do any MRIs for further workup.  Return to the ER for worsening symptoms.

## 2023-11-23 ENCOUNTER — Other Ambulatory Visit: Payer: Self-pay | Admitting: Obstetrics and Gynecology

## 2023-11-23 ENCOUNTER — Other Ambulatory Visit: Payer: Self-pay

## 2023-11-24 ENCOUNTER — Telehealth: Payer: Self-pay

## 2023-11-24 ENCOUNTER — Other Ambulatory Visit: Payer: Self-pay

## 2023-11-24 MED ORDER — MEDROXYPROGESTERONE ACETATE 150 MG/ML IM SUSY
150.0000 mg | PREFILLED_SYRINGE | INTRAMUSCULAR | 0 refills | Status: DC
  Filled 2023-11-24 (×2): qty 1, 90d supply, fill #0

## 2023-11-24 NOTE — Telephone Encounter (Signed)
Returned call and advised 1 refill for depo will be sent but needs to schedule annual for more refills, pt agreed.

## 2023-11-26 ENCOUNTER — Other Ambulatory Visit: Payer: Self-pay

## 2023-11-29 ENCOUNTER — Other Ambulatory Visit: Payer: Self-pay

## 2023-11-29 ENCOUNTER — Ambulatory Visit: Payer: No Typology Code available for payment source

## 2023-12-01 ENCOUNTER — Other Ambulatory Visit: Payer: Self-pay

## 2023-12-02 ENCOUNTER — Other Ambulatory Visit: Payer: Self-pay

## 2023-12-02 ENCOUNTER — Ambulatory Visit (INDEPENDENT_AMBULATORY_CARE_PROVIDER_SITE_OTHER): Payer: Self-pay

## 2023-12-02 VITALS — BP 150/91 | HR 94

## 2023-12-02 DIAGNOSIS — Z3042 Encounter for surveillance of injectable contraceptive: Secondary | ICD-10-CM

## 2023-12-02 DIAGNOSIS — Z3200 Encounter for pregnancy test, result unknown: Secondary | ICD-10-CM

## 2023-12-02 MED ORDER — MEDROXYPROGESTERONE ACETATE 150 MG/ML IM SUSY
150.0000 mg | PREFILLED_SYRINGE | INTRAMUSCULAR | 0 refills | Status: DC
  Filled 2023-12-02 – 2024-02-16 (×3): qty 1, 90d supply, fill #0

## 2023-12-02 MED ORDER — MEDROXYPROGESTERONE ACETATE 150 MG/ML IM SUSP
150.0000 mg | INTRAMUSCULAR | Status: DC
  Administered 2023-12-02: 150 mg via INTRAMUSCULAR

## 2023-12-02 NOTE — Progress Notes (Signed)
Pt is in the office for depo injection. Administered in [place] and pt tolerated well. Pt's BP is elevated today and she states that she has a PCP but she refuses to take her BP medication. Advised to follow up with PCP and start medication. Advised to schedule annual appt for additional depo refills .Marland Kitchen Administrations This Visit     medroxyPROGESTERone (DEPO-PROVERA) injection 150 mg     Admin Date 12/02/2023 Action Given Dose 150 mg Route Intramuscular Documented By Katrina Stack, RN

## 2023-12-02 NOTE — Addendum Note (Signed)
Addended by: Natale Milch D on: 12/02/2023 10:11 AM   Modules accepted: Orders

## 2023-12-21 ENCOUNTER — Other Ambulatory Visit: Payer: Self-pay

## 2023-12-22 ENCOUNTER — Other Ambulatory Visit: Payer: Self-pay

## 2023-12-23 ENCOUNTER — Other Ambulatory Visit (INDEPENDENT_AMBULATORY_CARE_PROVIDER_SITE_OTHER): Payer: Self-pay | Admitting: Pharmacist

## 2023-12-23 ENCOUNTER — Other Ambulatory Visit: Payer: Self-pay

## 2023-12-23 DIAGNOSIS — F332 Major depressive disorder, recurrent severe without psychotic features: Secondary | ICD-10-CM

## 2023-12-23 DIAGNOSIS — I10 Essential (primary) hypertension: Secondary | ICD-10-CM

## 2023-12-23 DIAGNOSIS — E1165 Type 2 diabetes mellitus with hyperglycemia: Secondary | ICD-10-CM

## 2023-12-23 DIAGNOSIS — E785 Hyperlipidemia, unspecified: Secondary | ICD-10-CM

## 2023-12-23 MED ORDER — FLUOCINOLONE ACETONIDE BODY 0.01 % EX OIL
TOPICAL_OIL | CUTANEOUS | 1 refills | Status: DC
  Filled 2023-12-23: qty 118.28, 20d supply, fill #0
  Filled 2024-04-14: qty 118.28, 28d supply, fill #0

## 2023-12-23 MED ORDER — VALSARTAN-HYDROCHLOROTHIAZIDE 80-12.5 MG PO TABS
1.0000 | ORAL_TABLET | Freq: Every day | ORAL | 3 refills | Status: DC
  Filled 2023-12-23: qty 30, 30d supply, fill #0
  Filled 2023-12-23: qty 90, 90d supply, fill #0
  Filled 2024-01-03 – 2024-01-26 (×3): qty 30, 30d supply, fill #1
  Filled 2024-02-14 – 2024-02-15 (×2): qty 30, 30d supply, fill #2
  Filled 2024-02-23 – 2024-04-13 (×5): qty 30, 30d supply, fill #3
  Filled 2024-05-01 – 2024-05-11 (×2): qty 30, 30d supply, fill #4
  Filled 2024-06-08 – 2024-06-20 (×2): qty 30, 30d supply, fill #5
  Filled 2024-07-24 – 2024-07-25 (×2): qty 30, 30d supply, fill #6
  Filled 2024-08-21: qty 30, 30d supply, fill #7
  Filled 2024-10-23: qty 30, 30d supply, fill #8
  Filled 2024-11-17: qty 30, 30d supply, fill #9
  Filled 2024-12-22: qty 30, 30d supply, fill #10

## 2023-12-23 MED ORDER — BUPROPION HCL ER (XL) 150 MG PO TB24
150.0000 mg | ORAL_TABLET | Freq: Every morning | ORAL | 2 refills | Status: DC
  Filled 2023-12-23: qty 30, 30d supply, fill #0
  Filled 2024-01-03: qty 30, 30d supply, fill #1
  Filled 2024-01-24 – 2024-01-26 (×2): qty 30, 30d supply, fill #2

## 2023-12-23 NOTE — Patient Instructions (Signed)
 Erica [name],   It was great talking to you today!  We will switch you from valsartan  to valsartan /hydrochlorothiazide  to help improve your blood pressure control.   Check your blood pressure twice weekly, and any time you have concerning symptoms like headache, chest pain, dizziness, shortness of breath, or vision changes.   Our goal is less than 130/80.  To appropriately check your blood pressure, make sure you do the following:  1) Avoid caffeine, exercise, or tobacco products for 30 minutes before checking. Empty your bladder. 2) Sit with your back supported in a flat-backed chair. Rest your arm on something flat (arm of the chair, table, etc). 3) Sit still with your feet flat on the floor, resting, for at least 5 minutes.  4) Check your blood pressure. Take 1-2 readings.  5) Write down these readings and bring with you to any provider appointments.  Bring your home blood pressure machine with you to a provider's office for accuracy comparison at least once a year.   Make sure you take your blood pressure medications before you come to any office visit, even if you were asked to fast for labs.   Thanks!  Catie IVAR Centers, PharmD, BCACP, CPP Clinical Pharmacist [facility] [phone]

## 2023-12-23 NOTE — Progress Notes (Signed)
 12/23/2023 Name: Erica [name] MRN: [id number] DOB: [birth year]  Chief Complaint  Patient presents with   Hypertension   Diabetes    Erica [name] is a 53 y.o. year old female who presented for a telephone visit.   They were referred to the pharmacist by their PCP for assistance in managing diabetes and hypertension.    Subjective:  Care Team: Primary Care Provider: [name], NP ; Next Scheduled Visit: not scheduled  Medication Access/Adherence  Current Pharmacy:  [facility] [address], [address] [place] [address] Phone: [phone] Fax: [phone]  Erica [name] - [facility] [address] [place] [address] Phone: [phone] Fax: [phone]  [facility] [address] [place] [address] Phone: [phone] Fax: [phone]   Patient reports affordability concerns with their medications: No  Patient reports access/transportation concerns to their pharmacy: No  Patient reports adherence concerns with their medications:  No    Notes she has started getting her medications adherence packaged from [facility]  Diabetes:  Current medications: metformin  XR 1000 mg twice daily, Ozempic  2 mg weekly  Denies any GI concerns. Confirms tolerability.   Current glucose readings: has not been checking, notes she does not like checking glucose. No access options for CGM at this point.   Patient denies hypoglycemic s/sx including dizziness, shakiness, sweating. Patient denies hyperglycemic symptoms including polyuria, polydipsia, polyphagia, nocturia, neuropathy, blurred vision.   Hypertension:  Current medications: valsartan  80 mg daily  Patient has a validated, automated, upper arm home BP cuff Current blood pressure readings readings: reports readings remain ~140s/90s  Patient denies hypotensive s/sx including dizziness,  lightheadedness since ED visit for dizziness Patient denies hypertensive symptoms including headache, chest pain, shortness of breath  Hyperlipidemia/ASCVD Risk Reduction  Current lipid lowering medications: atorvastatin  40 mg daily Objective:  Lab Results  Component Value Date   HGBA1C 10.7 (A) 05/31/2023    Lab Results  Component Value Date   CREATININE 0.66 11/22/2023   BUN 6 11/22/2023   NA 135 11/22/2023   K 4.2 11/22/2023   CL 101 11/22/2023   CO2 23 11/22/2023    Lab Results  Component Value Date   CHOL 155 02/17/2023   HDL 39 (L) 02/17/2023   LDLCALC 85 02/17/2023   TRIG 178 (H) 02/17/2023   CHOLHDL 4.0 02/17/2023    Medications Reviewed Today     Reviewed by Rudy Dorothyann DASEN, RPH-CPP (Pharmacist) on 12/23/23 at 0913  Med List Status: <None>   Medication Order Taking? Sig Documenting Provider Last Dose Status Informant  Accu-Chek Softclix Lancets lancets [id number]  4 (four) times daily.  Patient not taking: Reported on 05/31/2023   [provider]  Active     Discontinued 11/10/20 1306            Med Note Erica GUSTAV [name] Austin Nov 10, 2020 12:39 PM) Pt does not take  aspirin  81 MG tablet [id number]  Take 81 mg by mouth at bedtime.  [provider]  Active Self  atorvastatin  (LIPITOR) 40 MG tablet [id number] Yes Take 1 tablet (40 mg total) by mouth daily. [name], NP Taking Active   blood glucose meter kit and supplies KIT [id number]  Dispense based on patient and insurance preference. Use up to four times daily as directed. [name], NP  Active   Blood Glucose Monitoring Suppl (TRUE METRIX METER) w/Device KIT [id number]  Use to  check blood sugar up to twice daily. [name], NP  Active   buPROPion  (WELLBUTRIN  XL) 150 MG 24 hr tablet [id number]  Take 1 tablet (150 mg total) by mouth every morning. [name], NP  Active   ergocalciferol  (VITAMIN D2) 1.25 MG (50000 UT) capsule [id number]  Take 1 capsule by mouth once  a week. [name], NP  Active   escitalopram  (LEXAPRO ) 20 MG tablet [id number] Yes Take 1 tablet (20 mg total) by mouth daily. [name], NP Taking Active   Fluocinolone  Acetonide Body 0.01 % OIL [id number]  Apply to scalp 2 times daily for 2 weeks, then once daily for 2 weeks, then as needed thereafter. [name], NP  Active     Discontinued 11/10/20 1306            Med Note Erica, GUSTAV [name] Repress Nov 10, 2020 12:38 PM) Pt reports she does not take  Glucose Blood (BLOOD GLUCOSE TEST STRIPS) STRP [id number]  Use to check blood sugar up to twice daily. [name], NP  Active   Lancet Device MISC [id number]  Use to check blood sugar up to twice daily. May substitute to any manufacturer covered by patient's insurance. [name], NP  Active   meclizine  (ANTIVERT ) 25 MG tablet [id number]  Take 1 tablet (25 mg total) by mouth 3 (three) times daily as needed for dizziness. Ula Prentice JONELLE, MD  Active   medroxyPROGESTERone  (DEPO-PROVERA ) injection 150 mg [id number]   Erica Jerilynn LABOR, MD  Active   medroxyPROGESTERone  (PROVERA ) 10 MG tablet [id number]  Take 1 tablet (10 mg total) by mouth daily. Erica Lynwood MATSU, MD  Active   medroxyPROGESTERone  Acetate 150 MG/ML SUSY [id number]  Inject 1 mL (150 mg total) into the muscle every 3 (three) months. Constant, Peggy, MD  Active   medroxyPROGESTERone  Acetate 150 MG/ML SUSY [id number]  Inject 1 mL (150 mg total) into the muscle every 3 (three) months.  Patient not taking: Reported on 08/03/2023   Erica Jerilynn LABOR, MD  Active   medroxyPROGESTERone  Acetate 150 MG/ML SUSY [id number] Yes Inject 1 mL (150 mg total) into the muscle every 3 (three) months. Erica Jerilynn LABOR, MD Taking Active     Discontinued 09/30/23 1422   metFORMIN  (GLUCOPHAGE -XR) 500 MG 24 hr tablet [id number] Yes Take 2 tablets (1,000 mg total) by mouth 2 (two) times daily with a meal. [name], NP Taking Active   omeprazole  (PRILOSEC) 20 MG capsule [id number] Yes Take 1  capsule (20 mg total) by mouth daily. [name], NP Taking Active   ondansetron  (ZOFRAN ) 4 MG tablet [id number]  Take 1 tablet (4 mg total) by mouth every 8 (eight) hours as needed for nausea or vomiting. [name], NP  Active   oxyCODONE -acetaminophen  (PERCOCET) 7.5-325 MG tablet [id number]  Take 1 tablet by mouth every 6 (six) hours as needed for severe pain (pain score 7-10). Ula Prentice JONELLE, MD  Active   pregabalin  (LYRICA ) 150 MG capsule [id number] Yes Take 1 capsule (150 mg total) by mouth 2 (two) times daily. [name], NP Taking Active   Semaglutide , 2 MG/DOSE, (OZEMPIC , 2 MG/DOSE,) 8 MG/3ML SOPN [id number] Yes Inject 2 mg as directed once a week. [name], NP Taking Active   traZODone  (DESYREL ) 100 MG tablet [id number] Yes Take 1 tablet (100 mg total) by mouth at bedtime as needed for sleep [name], NP Taking Active   TRUEplus Lancets 28G  MISC [id number]  Use as directed 2 (two) times daily at 8 am and 10 pm. Mayers, Cari S, PA-C  Active   TRUEplus Lancets 28G MISC [id number]  Use to check blood sugar up to twice daily. [name], NP  Active   valsartan -hydrochlorothiazide  (DIOVAN -HCT) 80-12.5 MG tablet [id number] Yes Take 1 tablet by mouth daily. [name], NP  Active               Assessment/Plan:   Diabetes: - Currently uncontrolled - Reviewed [name] term cardiovascular and renal outcomes of uncontrolled blood sugar - Reviewed goal A1c, goal fasting, and goal 2 hour post prandial glucose - Overdue for follow up. Assisted in scheduling with PCP.   Hypertension: - Currently uncontrolled - Reviewed appropriate blood pressure monitoring technique and reviewed goal blood pressure. Recommended to check home blood pressure and heart rate periodically, document, and provide at future appointments - Recommend to add hydrochlorothiazide  to current valsartan . Will collaborate with PCP to start valsartan /hydrochlorothiazide  80/12.5 mg daily.  Patient is amenable.    Hyperlipidemia/ASCVD Risk Reduction: - Currently relatively well controlled, though could consider more stringent goal <70.  - Recommend to check lipids with next appointment   Follow Up Plan: PCP in 3 weeks, pending results will schedule follow up with pharmacy  Catie IVAR [facility], PharmD, BCACP, CPP Clinical Pharmacist [facility] [phone]

## 2023-12-28 ENCOUNTER — Other Ambulatory Visit: Payer: Self-pay

## 2023-12-30 ENCOUNTER — Other Ambulatory Visit: Payer: Self-pay

## 2023-12-31 ENCOUNTER — Other Ambulatory Visit: Payer: Self-pay

## 2024-01-03 ENCOUNTER — Other Ambulatory Visit: Payer: Self-pay

## 2024-01-05 ENCOUNTER — Other Ambulatory Visit: Payer: Self-pay

## 2024-01-10 ENCOUNTER — Other Ambulatory Visit: Payer: Self-pay

## 2024-01-14 ENCOUNTER — Ambulatory Visit: Payer: Self-pay | Admitting: Nurse Practitioner

## 2024-01-19 ENCOUNTER — Ambulatory Visit
Admission: RE | Admit: 2024-01-19 | Discharge: 2024-01-19 | Disposition: A | Discharge status: Home / Self Care | Payer: No Typology Code available for payment source | Source: Ambulatory Visit | Attending: Nurse Practitioner | Admitting: Nurse Practitioner

## 2024-01-19 DIAGNOSIS — Z1231 Encounter for screening mammogram for malignant neoplasm of breast: Secondary | ICD-10-CM

## 2024-01-24 ENCOUNTER — Other Ambulatory Visit: Payer: Self-pay

## 2024-01-26 ENCOUNTER — Other Ambulatory Visit: Payer: Self-pay

## 2024-01-27 ENCOUNTER — Other Ambulatory Visit: Payer: Self-pay

## 2024-01-28 ENCOUNTER — Other Ambulatory Visit: Payer: Self-pay

## 2024-01-31 ENCOUNTER — Other Ambulatory Visit: Payer: Self-pay

## 2024-02-03 ENCOUNTER — Telehealth: Payer: Self-pay

## 2024-02-03 NOTE — Telephone Encounter (Signed)
Copied from CRM [id number]. Topic: Clinical - Request for Lab/Test Order >> Feb 03, 2024  3:22 PM Erica [name] wrote: Reason for CRM: Erica [name] to check if she could get TB test for work. States she has two jobs and one does require a blood test and the other a skin test. [name] [employer]. Let Erica know they only do blood test. Erica will reach out to other job but would still like order put in to get the blood test done. Would like to have it by next week if possible. Thank You   Please advise when order are in. kh

## 2024-02-04 ENCOUNTER — Encounter (HOSPITAL_BASED_OUTPATIENT_CLINIC_OR_DEPARTMENT_OTHER): Payer: Self-pay

## 2024-02-04 ENCOUNTER — Other Ambulatory Visit: Payer: Self-pay

## 2024-02-04 ENCOUNTER — Emergency Department (HOSPITAL_BASED_OUTPATIENT_CLINIC_OR_DEPARTMENT_OTHER): Payer: No Typology Code available for payment source | Admitting: Radiology

## 2024-02-04 DIAGNOSIS — E119 Type 2 diabetes mellitus without complications: Secondary | ICD-10-CM | POA: Insufficient documentation

## 2024-02-04 DIAGNOSIS — R09A2 Foreign body sensation, throat: Secondary | ICD-10-CM | POA: Insufficient documentation

## 2024-02-04 DIAGNOSIS — Z7984 Long term (current) use of oral hypoglycemic drugs: Secondary | ICD-10-CM | POA: Insufficient documentation

## 2024-02-04 DIAGNOSIS — Z7982 Long term (current) use of aspirin: Secondary | ICD-10-CM | POA: Insufficient documentation

## 2024-02-04 NOTE — ED Triage Notes (Signed)
Pt reports she woke up this morning with a "knot" in her throat. Pt reports she feels like it has moved down her throat but states she can still feel it. Pt denies sore throat, sob,cp. Pt able to speak in full and complete sentences and airway intact. Pt reports she is still able to eat and drink but reports she still feels the "knot'.

## 2024-02-05 ENCOUNTER — Emergency Department (HOSPITAL_BASED_OUTPATIENT_CLINIC_OR_DEPARTMENT_OTHER)
Admission: EM | Admit: 2024-02-05 | Discharge: 2024-02-05 | Disposition: A | Discharge status: Home / Self Care | Payer: No Typology Code available for payment source | Attending: Emergency Medicine | Admitting: Emergency Medicine

## 2024-02-05 DIAGNOSIS — R09A2 Foreign body sensation, throat: Secondary | ICD-10-CM

## 2024-02-05 DIAGNOSIS — K219 Gastro-esophageal reflux disease without esophagitis: Secondary | ICD-10-CM

## 2024-02-05 MED ORDER — OMEPRAZOLE 20 MG PO CPDR
20.0000 mg | DELAYED_RELEASE_CAPSULE | Freq: Every day | ORAL | 3 refills | Status: DC
  Filled 2024-02-05 – 2024-02-15 (×3): qty 30, 30d supply, fill #0
  Filled 2024-02-23 – 2024-04-13 (×5): qty 30, 30d supply, fill #1
  Filled 2024-05-01 – 2024-05-11 (×2): qty 30, 30d supply, fill #2
  Filled 2024-06-08 – 2024-06-20 (×2): qty 30, 30d supply, fill #3

## 2024-02-05 NOTE — Discharge Instructions (Signed)
 You were seen today for the sensation.  This sometimes can make it feel like you have a foreign body in your throat.  Restart omeprazole follow-up closely with your primary doctor.

## 2024-02-05 NOTE — ED Notes (Signed)
 RN reviewed discharge instructions with pt. Pt verbalized understanding and had no further questions. VSS upon discharge.

## 2024-02-05 NOTE — ED Provider Notes (Signed)
 [place] EMERGENCY DEPARTMENT AT [facility] Provider Note   CSN: [id number] Arrival date & time: 02/04/24  2159     History {Add pertinent medical, surgical, social history, OB history to HPI:1} No chief complaint on file.   Erica [name] is a 53 y.o. female.  HPI     Home Medications Prior to Admission medications   Medication Sig Start Date End Date Taking? Authorizing Provider  Accu-Chek Softclix Lancets lancets 4 (four) times daily. Patient not taking: Reported on 05/31/2023 09/18/21   [provider]  aspirin 81 MG tablet Take 81 mg by mouth at bedtime.     [provider]  atorvastatin (LIPITOR) 40 MG tablet Take 1 tablet (40 mg total) by mouth daily. 09/22/23   Ivonne Andrew, NP  blood glucose meter kit and supplies KIT Dispense based on patient and insurance preference. Use up to four times daily as directed. 05/31/23   Ivonne Andrew, NP  Blood Glucose Monitoring Suppl (TRUE METRIX METER) w/Device KIT Use to check blood sugar up to twice daily. 08/03/23   Ivonne Andrew, NP  buPROPion (WELLBUTRIN XL) 150 MG 24 hr tablet Take 1 tablet (150 mg total) by mouth every morning. 12/23/23   Ivonne Andrew, NP  ergocalciferol (VITAMIN D2) 1.25 MG (50000 UT) capsule Take 1 capsule by mouth once a week. 09/22/23   Ivonne Andrew, NP  escitalopram (LEXAPRO) 20 MG tablet Take 1 tablet (20 mg total) by mouth daily. 09/22/23   Ivonne Andrew, NP  Fluocinolone Acetonide Body 0.01 % OIL Use as needed to scalp 12/23/23   Ivonne Andrew, NP  Glucose Blood (BLOOD GLUCOSE TEST STRIPS) STRP Use to check blood sugar up to twice daily. 08/03/23   Ivonne Andrew, NP  Lancet Device MISC Use to check blood sugar up to twice daily. May substitute to any manufacturer covered by patient's insurance. 08/03/23   Ivonne Andrew, NP  meclizine (ANTIVERT) 25 MG tablet Take 1 tablet (25 mg total) by mouth 3 (three) times daily as needed for dizziness. 11/22/23   Durwin Glaze, MD  medroxyPROGESTERone (PROVERA) 10 MG tablet Take 1 tablet (10 mg total) by mouth daily. 11/15/23   Adam Phenix, MD  medroxyPROGESTERone Acetate 150 MG/ML SUSY Inject 1 mL (150 mg total) into the muscle every 3 (three) months. 09/29/22   Constant, Peggy, MD  medroxyPROGESTERone Acetate 150 MG/ML SUSY Inject 1 mL (150 mg total) into the muscle every 3 (three) months. Patient not taking: Reported on 08/03/2023 03/29/23   Warden Fillers, MD  medroxyPROGESTERone Acetate 150 MG/ML SUSY Inject 1 mL (150 mg total) into the muscle every 3 (three) months. 12/02/23   Warden Fillers, MD  metFORMIN (GLUCOPHAGE-XR) 500 MG 24 hr tablet Take 2 tablets (1,000 mg total) by mouth 2 (two) times daily with a meal. 09/30/23   Ivonne Andrew, NP  omeprazole (PRILOSEC) 20 MG capsule Take 1 capsule (20 mg total) by mouth daily. 02/05/24   Makinze Jani, Mayer Masker, MD  ondansetron (ZOFRAN) 4 MG tablet Take 1 tablet (4 mg total) by mouth every 8 (eight) hours as needed for nausea or vomiting. 09/22/23   Ivonne Andrew, NP  oxyCODONE-acetaminophen (PERCOCET) 7.5-325 MG tablet Take 1 tablet by mouth every 6 (six) hours as needed for severe pain (pain score 7-10). 11/22/23   Durwin Glaze, MD  pregabalin (LYRICA) 150 MG capsule Take 1 capsule (150 mg total) by mouth 2 (two) times daily.  10/15/23   Ivonne Andrew, NP  Semaglutide, 2 MG/DOSE, (OZEMPIC, 2 MG/DOSE,) 8 MG/3ML SOPN Inject 2 mg as directed once a week. 11/05/23   Ivonne Andrew, NP  traZODone (DESYREL) 100 MG tablet Take 1 tablet (100 mg total) by mouth at bedtime as needed for sleep 09/22/23   Ivonne Andrew, NP  TRUEplus Lancets 28G MISC Use as directed 2 (two) times daily at 8 am and 10 pm. 02/16/23   Mayers, Cari S, PA-C  TRUEplus Lancets 28G MISC Use to check blood sugar up to twice daily. 08/03/23   Ivonne Andrew, NP  valsartan-hydrochlorothiazide (DIOVAN-HCT) 80-12.5 MG tablet Take 1 tablet by mouth daily. 12/23/23   Ivonne Andrew, NP  amitriptyline  (ELAVIL) 50 MG tablet amitriptyline 50 mg tablet  Take 1 tablet(s) every day by oral route as directed for 30 days.  11/10/20  [provider]  gabapentin (NEURONTIN) 300 MG capsule Take 300 mg by mouth.  11/10/20  [provider]  metFORMIN (GLUCOPHAGE) 1000 MG tablet Take 1 tablet (1,000 mg total) by mouth 2 (two) times daily with a meal. 09/22/23   Ivonne Andrew, NP      Allergies    Penicillins    Review of Systems   Review of Systems  Physical Exam Updated Vital Signs BP (!) 172/90 (BP Location: Right Arm)   Pulse 94   Temp 97.6 F (36.4 C)   Resp (!) 22   SpO2 99%  Physical Exam  ED Results / Procedures / Treatments   Labs (all labs ordered are listed, but only abnormal results are displayed) Labs Reviewed - No data to display  EKG None  Radiology DG Neck Soft Tissue Result Date: 02/04/2024 CLINICAL DATA:  Not in throat EXAM: NECK SOFT TISSUES - 1+ VIEW COMPARISON:  None Available. FINDINGS: There is no evidence of retropharyngeal soft tissue swelling or epiglottic enlargement. The cervical airway is unremarkable and no radio-opaque foreign body identified. Metallic density on the lateral view at C2 presumably represents an earring. Prominent degenerative changes C6-C7. IMPRESSION: Negative. Electronically Signed   By: Jasmine Pang M.D.   On: 02/04/2024 22:47    Procedures Procedures  {Document cardiac monitor, telemetry assessment procedure when appropriate:1}  Medications Ordered in ED Medications - No data to display  ED Course/ Medical Decision Making/ A&P   {   Click here for ABCD2, HEART and other calculatorsREFRESH Note before signing :1}                              Medical Decision Making Amount and/or Complexity of Data Reviewed Radiology: ordered.  Risk Prescription drug management.   ***  {Document critical care time when appropriate:1} {Document review of labs and clinical decision tools ie heart score, Chads2Vasc2  etc:1}  {Document your independent review of radiology images, and any outside records:1} {Document your discussion with family members, caretakers, and with consultants:1} {Document social determinants of health affecting pt's care:1} {Document your decision making why or why not admission, treatments were needed:1} Final Clinical Impression(s) / ED Diagnoses Final diagnoses:  Globus sensation    Rx / DC Orders ED Discharge Orders          Ordered    omeprazole (PRILOSEC) 20 MG capsule  Daily        02/05/24 0130

## 2024-02-07 ENCOUNTER — Other Ambulatory Visit: Payer: No Typology Code available for payment source

## 2024-02-07 ENCOUNTER — Other Ambulatory Visit: Payer: Self-pay

## 2024-02-07 DIAGNOSIS — Z111 Encounter for screening for respiratory tuberculosis: Secondary | ICD-10-CM

## 2024-02-07 DIAGNOSIS — K219 Gastro-esophageal reflux disease without esophagitis: Secondary | ICD-10-CM

## 2024-02-07 DIAGNOSIS — E1165 Type 2 diabetes mellitus with hyperglycemia: Secondary | ICD-10-CM

## 2024-02-08 ENCOUNTER — Telehealth: Payer: Self-pay

## 2024-02-08 LAB — CBC
Hematocrit: 40.9 % (ref 34.0–46.6)
Hemoglobin: 13.4 g/dL (ref 11.1–15.9)
MCH: 28 pg (ref 26.6–33.0)
MCHC: 32.8 g/dL (ref 31.5–35.7)
MCV: 85 fL (ref 79–97)
Platelets: 369 10*3/uL (ref 150–450)
RBC: 4.79 x10E6/uL (ref 3.77–5.28)
RDW: 11.8 % (ref 11.7–15.4)
WBC: 7.2 10*3/uL (ref 3.4–10.8)

## 2024-02-08 LAB — COMPREHENSIVE METABOLIC PANEL
ALT: 15 [IU]/L (ref 0–32)
AST: 14 [IU]/L (ref 0–40)
Albumin: 4.3 g/dL (ref 3.9–4.9)
Alkaline Phosphatase: 94 [IU]/L (ref 44–121)
BUN/Creatinine Ratio: 10 (ref 9–23)
BUN: 7 mg/dL (ref 6–24)
Bilirubin Total: 0.3 mg/dL (ref 0.0–1.2)
CO2: 22 mmol/L (ref 20–29)
Calcium: 9.6 mg/dL (ref 8.7–10.2)
Chloride: 99 mmol/L (ref 96–106)
Creatinine, Ser: 0.71 mg/dL (ref 0.57–1.00)
Globulin, Total: 2.6 g/dL (ref 1.5–4.5)
Glucose: 245 mg/dL — ABNORMAL HIGH (ref 70–99)
Potassium: 4.1 mmol/L (ref 3.5–5.2)
Sodium: 138 mmol/L (ref 134–144)
Total Protein: 6.9 g/dL (ref 6.0–8.5)
eGFR: 104 mL/min/{1.73_m2} (ref >59)

## 2024-02-08 LAB — HEMOGLOBIN A1C
Est. average glucose Bld gHb Est-mCnc: 280 mg/dL
Hgb A1c MFr Bld: 11.4 % — ABNORMAL HIGH (ref 4.8–5.6)

## 2024-02-08 NOTE — Transitions of Care (Post Inpatient/ED Visit) (Signed)
   02/08/2024  Name: Erica [name] MRN: [id number] DOB: [birth year]  Today's TOC FU Call Status: Today's TOC FU Call Status:: Unsuccessful Call (1st Attempt) Unsuccessful Call (1st Attempt) Date: 02/08/24  Attempted to reach the patient regarding the most recent Inpatient/ED visit.  Follow Up Plan: Additional outreach attempts will be made to reach the patient to complete the Transitions of Care (Post Inpatient/ED visit) call.   Signature  [employer], [place]

## 2024-02-10 ENCOUNTER — Other Ambulatory Visit: Payer: Self-pay | Admitting: Obstetrics and Gynecology

## 2024-02-10 LAB — QUANTIFERON-TB GOLD PLUS
QuantiFERON Nil Value: 0.04 [IU]/mL
QuantiFERON TB1 Ag Value: 0.06 [IU]/mL
QuantiFERON TB2 Ag Value: 0.05 [IU]/mL

## 2024-02-11 ENCOUNTER — Telehealth: Payer: Self-pay

## 2024-02-11 NOTE — Transitions of Care (Post Inpatient/ED Visit) (Signed)
   02/11/2024  Name: Erica [name] MRN: [id number] DOB: [birth year]  Today's TOC FU Call Status: Today's TOC FU Call Status:: Unsuccessful Call (2nd Attempt) Unsuccessful Call (2nd Attempt) Date: 02/11/24  Attempted to reach the patient regarding the most recent Inpatient/ED visit.  Follow Up Plan: Additional outreach attempts will be made to reach the patient to complete the Transitions of Care (Post Inpatient/ED visit) call.   Signature Renelda Loma RMA'

## 2024-02-14 ENCOUNTER — Other Ambulatory Visit: Payer: Self-pay

## 2024-02-14 ENCOUNTER — Other Ambulatory Visit: Payer: Self-pay | Admitting: Nurse Practitioner

## 2024-02-14 DIAGNOSIS — F332 Major depressive disorder, recurrent severe without psychotic features: Secondary | ICD-10-CM

## 2024-02-14 MED ORDER — BUPROPION HCL ER (XL) 150 MG PO TB24
150.0000 mg | ORAL_TABLET | Freq: Every morning | ORAL | 2 refills | Status: DC
  Filled 2024-02-14 – 2024-04-13 (×5): qty 30, 30d supply, fill #0
  Filled 2024-05-01 – 2024-05-11 (×2): qty 30, 30d supply, fill #1
  Filled 2024-06-08 – 2024-06-20 (×2): qty 30, 30d supply, fill #2

## 2024-02-14 NOTE — Telephone Encounter (Signed)
 Please advise [facility]

## 2024-02-15 ENCOUNTER — Other Ambulatory Visit: Payer: Self-pay

## 2024-02-16 ENCOUNTER — Other Ambulatory Visit: Payer: Self-pay

## 2024-02-17 ENCOUNTER — Other Ambulatory Visit: Payer: Self-pay

## 2024-02-17 ENCOUNTER — Encounter: Payer: Self-pay | Admitting: Nurse Practitioner

## 2024-02-17 ENCOUNTER — Ambulatory Visit (INDEPENDENT_AMBULATORY_CARE_PROVIDER_SITE_OTHER): Payer: Self-pay

## 2024-02-17 ENCOUNTER — Ambulatory Visit (INDEPENDENT_AMBULATORY_CARE_PROVIDER_SITE_OTHER): Payer: Self-pay | Admitting: Nurse Practitioner

## 2024-02-17 ENCOUNTER — Ambulatory Visit: Payer: Self-pay

## 2024-02-17 ENCOUNTER — Ambulatory Visit: Payer: Self-pay | Admitting: Obstetrics and Gynecology

## 2024-02-17 VITALS — BP 154/95 | HR 97 | Temp 98.2°F | Wt 254.0 lb

## 2024-02-17 DIAGNOSIS — Z13220 Encounter for screening for lipoid disorders: Secondary | ICD-10-CM

## 2024-02-17 DIAGNOSIS — E538 Deficiency of other specified B group vitamins: Secondary | ICD-10-CM

## 2024-02-17 DIAGNOSIS — E559 Vitamin D deficiency, unspecified: Secondary | ICD-10-CM

## 2024-02-17 DIAGNOSIS — Z3042 Encounter for surveillance of injectable contraceptive: Secondary | ICD-10-CM

## 2024-02-17 DIAGNOSIS — E1165 Type 2 diabetes mellitus with hyperglycemia: Secondary | ICD-10-CM

## 2024-02-17 LAB — POCT GLYCOSYLATED HEMOGLOBIN (HGB A1C): Hemoglobin A1C: 11.1 % — AB (ref 4.0–5.6)

## 2024-02-17 MED ORDER — MEDROXYPROGESTERONE ACETATE 150 MG/ML IM SUSP
150.0000 mg | Freq: Once | INTRAMUSCULAR | Status: AC
  Administered 2024-02-17: 150 mg via INTRAMUSCULAR

## 2024-02-17 MED ORDER — TRAZODONE HCL 150 MG PO TABS
150.0000 mg | ORAL_TABLET | Freq: Every day | ORAL | 2 refills | Status: DC
  Filled 2024-02-17: qty 90, 90d supply, fill #0
  Filled 2024-02-23 – 2024-05-01 (×5): qty 90, 90d supply, fill #1
  Filled 2024-05-11: qty 30, 30d supply, fill #1
  Filled 2024-06-08 – 2024-06-20 (×2): qty 30, 30d supply, fill #2
  Filled 2024-07-24 – 2024-07-25 (×2): qty 30, 30d supply, fill #3
  Filled 2024-08-21: qty 30, 30d supply, fill #4
  Filled 2024-10-23: qty 30, 30d supply, fill #5
  Filled 2024-11-17: qty 30, 30d supply, fill #6

## 2024-02-17 NOTE — Progress Notes (Signed)
 Subjective   Patient ID: Erica [name], female    DOB: 1971/08/10, 53 y.o.   MRN: [id number]  Chief Complaint  Patient presents with   Annual Exam   Diabetes    Referring provider: Ivonne Andrew, NP  Erica [name] is a 53 y.o. female with Past Medical History: No date: Arthritis No date: Depression No date: Diabetes mellitus No date: Fatty liver No date: Hernia, abdominal No date: Hypercholesteremia No date: IBS (irritable bowel syndrome)   HPI  Patient presents today for annual exam.  She does have a history of vitamin D deficiency and diabetes.  She does have peripheral nerve disease.  We will check labs today.  A1c in office today was 11.4.  Patient is noncompliant with medications. Denies f/c/s, n/v/d, hemoptysis, PND, leg swelling Denies chest pain or edema       Allergies  Allergen Reactions   Penicillins     Reported from childhood    Immunization History  Administered Date(s) Administered   PNEUMOCOCCAL CONJUGATE-20 05/05/2022   PPD Test 02/08/2023   Tdap 02/01/2020    Tobacco History: Social History   Tobacco Use  Smoking Status Never  Smokeless Tobacco Never   Counseling given: Not Answered   Outpatient Encounter Medications as of 02/17/2024  Medication Sig   Accu-Chek Softclix Lancets lancets 4 (four) times daily.   aspirin 81 MG tablet Take 81 mg by mouth at bedtime.    atorvastatin (LIPITOR) 40 MG tablet Take 1 tablet (40 mg total) by mouth daily.   Blood Glucose Monitoring Suppl (TRUE METRIX METER) w/Device KIT Use to check blood sugar up to twice daily.   buPROPion (WELLBUTRIN XL) 150 MG 24 hr tablet Take 1 tablet (150 mg total) by mouth every morning.   ergocalciferol (VITAMIN D2) 1.25 MG (50000 UT) capsule Take 1 capsule by mouth once a week.   escitalopram (LEXAPRO) 20 MG tablet Take 1 tablet (20 mg total) by mouth daily.   Fluocinolone Acetonide Body 0.01 % OIL Use as needed to scalp   Glucose Blood (BLOOD GLUCOSE TEST STRIPS) STRP  Use to check blood sugar up to twice daily.   Lancet Device MISC Use to check blood sugar up to twice daily. May substitute to any manufacturer covered by patient's insurance.   meclizine (ANTIVERT) 25 MG tablet Take 1 tablet (25 mg total) by mouth 3 (three) times daily as needed for dizziness.   medroxyPROGESTERone (PROVERA) 10 MG tablet Take 1 tablet (10 mg total) by mouth daily.   metFORMIN (GLUCOPHAGE-XR) 500 MG 24 hr tablet Take 2 tablets (1,000 mg total) by mouth 2 (two) times daily with a meal.   omeprazole (PRILOSEC) 20 MG capsule Take 1 capsule (20 mg total) by mouth daily.   pregabalin (LYRICA) 150 MG capsule Take 1 capsule (150 mg total) by mouth 2 (two) times daily.   Semaglutide, 2 MG/DOSE, (OZEMPIC, 2 MG/DOSE,) 8 MG/3ML SOPN Inject 2 mg as directed once a week.   traZODone (DESYREL) 150 MG tablet Take 1 tablet (150 mg total) by mouth at bedtime.   TRUEplus Lancets 28G MISC Use as directed 2 (two) times daily at 8 am and 10 pm.   TRUEplus Lancets 28G MISC Use to check blood sugar up to twice daily.   valsartan-hydrochlorothiazide (DIOVAN-HCT) 80-12.5 MG tablet Take 1 tablet by mouth daily.   [DISCONTINUED] blood glucose meter kit and supplies KIT Dispense based on patient and insurance preference. Use up to four times daily as directed.   [DISCONTINUED] medroxyPROGESTERone  Acetate 150 MG/ML SUSY Inject 1 mL (150 mg total) into the muscle every 3 (three) months.   [DISCONTINUED] traZODone (DESYREL) 100 MG tablet Take 1 tablet (100 mg total) by mouth at bedtime as needed for sleep   medroxyPROGESTERone Acetate 150 MG/ML SUSY Inject 1 mL (150 mg total) into the muscle every 3 (three) months.   ondansetron (ZOFRAN) 4 MG tablet Take 1 tablet (4 mg total) by mouth every 8 (eight) hours as needed for nausea or vomiting. (Patient not taking: Reported on 02/17/2024)   oxyCODONE-acetaminophen (PERCOCET) 7.5-325 MG tablet Take 1 tablet by mouth every 6 (six) hours as needed for severe pain (pain  score 7-10). (Patient not taking: Reported on 02/17/2024)   [DISCONTINUED] amitriptyline (ELAVIL) 50 MG tablet amitriptyline 50 mg tablet  Take 1 tablet(s) every day by oral route as directed for 30 days.   [DISCONTINUED] gabapentin (NEURONTIN) 300 MG capsule Take 300 mg by mouth.   [DISCONTINUED] medroxyPROGESTERone Acetate 150 MG/ML SUSY Inject 1 mL (150 mg total) into the muscle every 3 (three) months. (Patient not taking: Reported on 02/17/2024)   [DISCONTINUED] metFORMIN (GLUCOPHAGE) 1000 MG tablet Take 1 tablet (1,000 mg total) by mouth 2 (two) times daily with a meal.   Facility-Administered Encounter Medications as of 02/17/2024  Medication   medroxyPROGESTERone (DEPO-PROVERA) injection 150 mg    Review of Systems  Review of Systems  Constitutional: Negative.   HENT: Negative.    Cardiovascular: Negative.   Gastrointestinal: Negative.   Allergic/Immunologic: Negative.   Neurological: Negative.   Psychiatric/Behavioral: Negative.       Objective:   BP (!) 154/95   Pulse 97   Temp 98.2 F (36.8 C) (Oral)   Wt 254 lb (115.2 kg)   SpO2 98%   BMI 34.45 kg/m   Wt Readings from Last 5 Encounters:  02/17/24 254 lb (115.2 kg)  09/22/23 260 lb (117.9 kg)  09/06/23 260 lb (117.9 kg)  05/31/23 262 lb 3.2 oz (118.9 kg)  02/16/23 265 lb (120.2 kg)     Physical Exam Vitals and nursing note reviewed.  Constitutional:      General: She is not in acute distress.    Appearance: She is well-developed.  Cardiovascular:     Rate and Rhythm: Normal rate and regular rhythm.  Pulmonary:     Effort: Pulmonary effort is normal.     Breath sounds: Normal breath sounds.  Neurological:     Mental Status: She is alert and oriented to person, place, and time.       Assessment & Plan:   Type 2 diabetes mellitus with hyperglycemia, without long-term current use of insulin (HCC) -     POCT glycosylated hemoglobin (Hb A1C)  Lipid screening -     Lipid panel; Future  Vitamin D  deficiency -     VITAMIN D 25 Hydroxy (Vit-D Deficiency, Fractures); Future  Vitamin B12 deficiency -     Vitamin B12; Future  Other orders -     traZODone HCl; Take 1 tablet (150 mg total) by mouth at bedtime.  Dispense: 90 tablet; Refill: 2     Return in about 3 months (around 05/19/2024).   Ivonne Andrew, NP 02/17/2024

## 2024-02-17 NOTE — Patient Instructions (Addendum)
 1. Type 2 diabetes mellitus with hyperglycemia, without long-term current use of insulin (HCC) (Primary)  - POCT glycosylated hemoglobin (Hb A1C)  2. Lipid screening  - Lipid Panel; Future  3. Vitamin D deficiency  - Vitamin D, 25-hydroxy; Future  4. Vitamin B12 deficiency  - Vitamin B12; Future    Follow up:  Follow up in 3 months

## 2024-02-17 NOTE — Progress Notes (Signed)
 Date last pap: Needs annual. Last Depo-Provera: 12/02/23. Side Effects if any: NA. Serum HCG indicated? NA. Depo-Provera 150 mg IM given by: Karma Ganja, RN. Next appointment due May 22nd - June 5th 2025.   Pt aware BP high in office today, has appt to discuss with PCP.

## 2024-02-21 ENCOUNTER — Other Ambulatory Visit: Payer: Self-pay

## 2024-02-22 ENCOUNTER — Encounter: Payer: Self-pay | Admitting: Nurse Practitioner

## 2024-02-22 ENCOUNTER — Other Ambulatory Visit

## 2024-02-22 ENCOUNTER — Other Ambulatory Visit: Payer: Self-pay

## 2024-02-22 ENCOUNTER — Ambulatory Visit (INDEPENDENT_AMBULATORY_CARE_PROVIDER_SITE_OTHER): Payer: Self-pay | Admitting: Nurse Practitioner

## 2024-02-22 ENCOUNTER — Telehealth: Payer: Self-pay

## 2024-02-22 VITALS — BP 147/79 | HR 98 | Temp 97.0°F | Wt 255.0 lb

## 2024-02-22 DIAGNOSIS — E538 Deficiency of other specified B group vitamins: Secondary | ICD-10-CM

## 2024-02-22 DIAGNOSIS — Z13220 Encounter for screening for lipoid disorders: Secondary | ICD-10-CM

## 2024-02-22 DIAGNOSIS — E559 Vitamin D deficiency, unspecified: Secondary | ICD-10-CM

## 2024-02-22 DIAGNOSIS — R21 Rash and other nonspecific skin eruption: Secondary | ICD-10-CM | POA: Insufficient documentation

## 2024-02-22 DIAGNOSIS — Z79899 Other long term (current) drug therapy: Secondary | ICD-10-CM

## 2024-02-22 MED ORDER — TRIAMCINOLONE ACETONIDE 0.025 % EX OINT
1.0000 | TOPICAL_OINTMENT | Freq: Two times a day (BID) | CUTANEOUS | 0 refills | Status: DC
  Filled 2024-02-22: qty 30, 15d supply, fill #0

## 2024-02-22 NOTE — Patient Instructions (Signed)
.   Rash and other nonspecific skin eruption (Primary)  - triamcinolone (KENALOG) 0.025 % ointment; Apply 1 Application topically 2 (two) times daily.  Dispense: 30 g; Refill: 0    It is important that you exercise regularly at least 30 minutes 5 times a week as tolerated  Think about what you will eat, plan ahead. Choose " clean, green, fresh or frozen" over canned, processed or packaged foods which are more sugary, salty and fatty. 70 to 75% of food eaten should be vegetables and fruit. Three meals at set times with snacks allowed between meals, but they must be fruit or vegetables. Aim to eat over a 12 hour period , example 7 am to 7 pm, and STOP after  your last meal of the day. Drink water,generally about 64 ounces per day, no other drink is as healthy. Fruit juice is best enjoyed in a healthy way, by EATING the fruit.  Thanks for choosing [facility] we consider it a privelige to serve you.

## 2024-02-22 NOTE — Progress Notes (Signed)
   02/22/2024  Patient ID: Erica [name], female   DOB: [birth year], 53 y.o.   MRN: [id number]  Attempted to contact patient for medication management/review. Left HIPAA compliant message for patient to return my call at their convenience.   First attempt for patient outreach. Will follow up with patient in 3-5 business days.  Thank you for allowing pharmacy to be a part of this patient's care.  Cephus Shelling, PharmD Clinical Pharmacist Cell: [phone]

## 2024-02-22 NOTE — Assessment & Plan Note (Signed)
-   triamcinolone (KENALOG) 0.025 % ointment; Apply 1 Application topically 2 (two) times daily.  Dispense: 30 g; Refill: 0 .Apply twice daily for 7 days Patient encouraged to avoid irritants.  Avoid scratching sites to prevent infection

## 2024-02-22 NOTE — Progress Notes (Signed)
 Acute Office Visit  Subjective:     Patient ID: Erica [name], female    DOB: [birth year], 53 y.o.   MRN: [id number]  Chief Complaint  Patient presents with   Rash    Rash Pertinent negatives include no congestion, cough, fatigue, fever, rhinorrhea, shortness of breath or vomiting.   Ms Pagliarulo has a past medical history of Arthritis, Depression, Diabetes mellitus, Fatty liver, Hernia, abdominal, Hypercholesteremia, and IBS (irritable bowel syndrome).   Patient presents with complaints of rashes on her right elbow:, right side of the neck, left shoulder that she first noticed over the weekend.  States that the rashes are itchy.  States that she has sensitive skin, she did not starting any new medication or using any new skin care products.  She has been using OTC hydrocortisone cream that has helped reduce the itching.  She denies fever, chills, shortness of breath, chest pain.     Review of Systems  Constitutional:  Negative for appetite change, chills, fatigue and fever.  HENT:  Negative for congestion, postnasal drip, rhinorrhea and sneezing.   Respiratory:  Negative for cough, shortness of breath and wheezing.   Cardiovascular:  Negative for chest pain, palpitations and leg swelling.  Gastrointestinal:  Negative for abdominal pain, constipation, nausea and vomiting.  Genitourinary:  Negative for difficulty urinating, dysuria, flank pain and frequency.  Musculoskeletal:  Negative for arthralgias, back pain, joint swelling and myalgias.  Skin:  Positive for rash. Negative for color change, pallor and wound.  Neurological:  Negative for dizziness, facial asymmetry, weakness, numbness and headaches.  Psychiatric/Behavioral:  Negative for behavioral problems, confusion, self-injury and suicidal ideas.         Objective:    BP (!) 147/79   Pulse 98   Temp (!) 97 F (36.1 C)   Wt 255 lb (115.7 kg)   SpO2 100%   BMI 34.58 kg/m    Physical Exam Vitals and nursing note  reviewed.  Constitutional:      General: She is not in acute distress.    Appearance: Normal appearance. She is obese. She is not ill-appearing, toxic-appearing or diaphoretic.  HENT:     Mouth/Throat:     Mouth: Mucous membranes are moist.     Pharynx: Oropharynx is clear. No oropharyngeal exudate or posterior oropharyngeal erythema.  Eyes:     General: No scleral icterus.       Right eye: No discharge.        Left eye: No discharge.     Extraocular Movements: Extraocular movements intact.     Conjunctiva/sclera: Conjunctivae normal.  Cardiovascular:     Rate and Rhythm: Normal rate and regular rhythm.     Pulses: Normal pulses.     Heart sounds: Normal heart sounds. No murmur heard.    No friction rub. No gallop.  Pulmonary:     Effort: Pulmonary effort is normal. No respiratory distress.     Breath sounds: Normal breath sounds. No stridor. No wheezing, rhonchi or rales.  Chest:     Chest wall: No tenderness.  Abdominal:     General: There is no distension.     Palpations: Abdomen is soft.     Tenderness: There is no abdominal tenderness. There is no right CVA tenderness, left CVA tenderness or guarding.  Musculoskeletal:        General: No swelling, tenderness, deformity or signs of injury.     Right lower leg: No edema.     Left lower leg: No edema.  Skin:    General: Skin is warm and dry.     Capillary Refill: Capillary refill takes less than 2 seconds.     Coloration: Skin is not jaundiced or pale.     Findings: Rash present. No bruising, erythema or lesion.     Comments: Erythematous rashes noted on right elbow, right side of the neck and left shoulder area.  No swelling or discharge noted  Neurological:     Mental Status: She is alert and oriented to person, place, and time.     Motor: No weakness.     Coordination: Coordination normal.     Gait: Gait normal.  Psychiatric:        Mood and Affect: Mood normal.        Behavior: Behavior normal.        Thought  Content: Thought content normal.        Judgment: Judgment normal.     No results found for any visits on 02/22/24.      Assessment & Plan:   Problem List Items Addressed This Visit       Musculoskeletal and Integument   Rash and other nonspecific skin eruption - Primary    - triamcinolone (KENALOG) 0.025 % ointment; Apply 1 Application topically 2 (two) times daily.  Dispense: 30 g; Refill: 0 .Apply twice daily for 7 days Patient encouraged to avoid irritants.  Avoid scratching sites to prevent infection      Relevant Medications   triamcinolone (KENALOG) 0.025 % ointment     Other   Vitamin D deficiency   Other Visit Diagnoses       Vitamin B12 deficiency         Lipid screening           Meds ordered this encounter  Medications   triamcinolone (KENALOG) 0.025 % ointment    Sig: Apply 1 Application topically 2 (two) times daily.    Dispense:  30 g    Refill:  0    No follow-ups on file.  Donell Beers, FNP

## 2024-02-23 ENCOUNTER — Other Ambulatory Visit: Payer: Self-pay | Admitting: Nurse Practitioner

## 2024-02-23 ENCOUNTER — Other Ambulatory Visit: Payer: Self-pay

## 2024-02-23 DIAGNOSIS — E559 Vitamin D deficiency, unspecified: Secondary | ICD-10-CM

## 2024-02-23 LAB — LIPID PANEL
Chol/HDL Ratio: 5.7 ratio — ABNORMAL HIGH (ref 0.0–4.4)
Cholesterol, Total: 183 mg/dL (ref 100–199)
HDL: 32 mg/dL — ABNORMAL LOW (ref >39)
LDL Chol Calc (NIH): 96 mg/dL (ref 0–99)
Triglycerides: 329 mg/dL — ABNORMAL HIGH (ref 0–149)
VLDL Cholesterol Cal: 55 mg/dL — ABNORMAL HIGH (ref 5–40)

## 2024-02-23 LAB — VITAMIN D 25 HYDROXY (VIT D DEFICIENCY, FRACTURES): Vit D, 25-Hydroxy: 25.7 ng/mL — ABNORMAL LOW (ref 30.0–100.0)

## 2024-02-23 LAB — VITAMIN B12: Vitamin B-12: 311 pg/mL (ref 232–1245)

## 2024-02-23 MED ORDER — ERGOCALCIFEROL 1.25 MG (50000 UT) PO CAPS
1.0000 | ORAL_CAPSULE | ORAL | 2 refills | Status: DC
  Filled 2024-03-15 – 2024-07-25 (×9): qty 4, 28d supply, fill #0
  Filled 2024-08-21: qty 4, 28d supply, fill #1
  Filled 2024-10-09 – 2024-10-23 (×2): qty 4, 28d supply, fill #2

## 2024-03-07 ENCOUNTER — Telehealth: Payer: Self-pay

## 2024-03-07 NOTE — Progress Notes (Signed)
   03/07/2024  Patient ID: Erica [name], female   DOB: 09-17-1971, 53 y.o.   MRN: [id number]  Attempted to contact patient for medication management/review. Left HIPAA compliant message for patient to return my call at their convenience.   Second attempt for patient outreach. Will follow up with patient in 5-7 business days.  Thank you for allowing pharmacy to be a part of this patient's care.  Cephus Shelling, PharmD Clinical Pharmacist Cell: [phone]

## 2024-03-15 ENCOUNTER — Other Ambulatory Visit: Payer: Self-pay

## 2024-03-17 ENCOUNTER — Other Ambulatory Visit: Payer: Self-pay

## 2024-03-17 ENCOUNTER — Other Ambulatory Visit (HOSPITAL_COMMUNITY): Payer: Self-pay

## 2024-03-21 ENCOUNTER — Other Ambulatory Visit: Payer: Self-pay

## 2024-03-21 DIAGNOSIS — Z79899 Other long term (current) drug therapy: Secondary | ICD-10-CM

## 2024-03-21 NOTE — Progress Notes (Signed)
 03/21/2024 Name: Erica [name] MRN: [id number] DOB: [birth year]  Chief Complaint  Patient presents with   Hypertension   Diabetes    Erica [name] is a 53 y.o. year old female who presented for a telephone visit.   They were referred to the pharmacist by their PCP for assistance in managing diabetes, hypertension, and hyperlipidemia.    Subjective:  Care Team: Primary Care Provider: Jerrlyn Morel, NP ; Next Scheduled Visit: 06/02/2024   Medication Access/Adherence  Current Pharmacy:  [facility] [address] [place] [address] Phone: [phone] Fax: [phone]  [facility] [name] [place] [address] Phone: [phone] Fax: [phone]  [facility] [place] [facility] [address] [place] [address] Phone: [phone] Fax: [phone]   Patient reports affordability concerns with their medications: No  Patient reports access/transportation concerns to their pharmacy: No  Patient reports adherence concerns with their medications:  Yes  "trying to do better" - Still using medication adherence packaging from [facility] but still miss doses (she is unable to quantify) - Medication Access - Ozempic through PAP through 06/08/2024 per scaned document  on 7/3/024    Diabetes:  Current medications: Ozempic 2mg  weekly (Sunday), metformin XR 1000 mg twice daily    Current glucose readings: She lost her job and insurance. She does not use Dexcom or traditional BGM. However she still owns a BGM.    Patient reports hypoglycemic s/sx including dizziness, but doesn't check blood glucose. Patient reports hyperglycemic symptoms including neuropathy in feet.  Current meal patterns:  - Typically skips breakfast; pancakes, bacon  Current physical activity: None at this time   Hypertension:  Current medications: valsartan-  hydrochlorothiazide 80-12.5 mg daily  Patient has a validated, automated, upper arm home BP cuff Current blood pressure readings readings: she does not check her BP  Patient reports hypotensive s/sx including dizziness, lightheadedness. Yet she does not check her blood pressure.  Patient denies hypertensive symptoms including headache, chest pain, shortness of breath    Hyperlipidemia/ASCVD Risk Reduction  Current lipid lowering medications: atorvastatin 40 mg daily  The 10-year ASCVD risk score (Arnett DK, et al., 2019) is: 25.5%   Values used to calculate the score:     Age: 50 years     Sex: Female     Is Non-Hispanic African American: Yes     Diabetic: Yes     Tobacco smoker: No     Systolic Blood Pressure: 147 mmHg     Is BP treated: Yes     HDL Cholesterol: 32 mg/dL     Total Cholesterol: 183 mg/dL    Objective:  Lab Results  Component Value Date   HGBA1C 11.1 (A) 02/17/2024    Lab Results  Component Value Date   CREATININE 0.71 02/07/2024   BUN 7 02/07/2024   NA 138 02/07/2024   K 4.1 02/07/2024   CL 99 02/07/2024   CO2 22 02/07/2024    Lab Results  Component Value Date   CHOL 183 02/22/2024   HDL 32 (L) 02/22/2024   LDLCALC 96 02/22/2024   TRIG 329 (H) 02/22/2024   CHOLHDL 5.7 (H) 02/22/2024    Medications Reviewed Today     Reviewed by Amedeo Bailiff, RPH (Pharmacist) on 03/21/24 at [phone]  Med List Status: <None>   Medication Order Taking? Sig Documenting Provider Last Dose Status Informant    Discontinued 11/10/20 1306  Med Note Alwyn Juba Nov 10, 2020 12:39 PM) Pt does not take  aspirin 81 MG tablet [id number] Yes Take 81 mg by mouth at bedtime.  [provider] Taking Active Self  atorvastatin (LIPITOR) 40 MG tablet [id number] Yes Take 1 tablet (40 mg total) by mouth daily. Jerrlyn Morel, NP Taking Active   Blood Glucose Monitoring Suppl (TRUE METRIX METER) w/Device KIT [id number] Yes Use to check blood sugar  up to twice daily. Jerrlyn Morel, NP Taking Active            Med Note Vallarie Gauze, [facility] R   Tue Mar 21, 2024  9:27 AM) supply  buPROPion (WELLBUTRIN XL) 150 MG 24 hr tablet [id number] Yes Take 1 tablet (150 mg total) by mouth every morning. Jerrlyn Morel, NP Taking Active   ergocalciferol (VITAMIN D2) 1.25 MG (50000 UT) capsule [id number] Yes Take 1 capsule by mouth once a week. Jerrlyn Morel, NP Taking Active   escitalopram (LEXAPRO) 20 MG tablet [id number] Yes Take 1 tablet (20 mg total) by mouth daily. Jerrlyn Morel, NP Taking Active   Fluocinolone Acetonide Body 0.01 % OIL [id number] Yes Use as needed to scalp Jerrlyn Morel, NP Taking Active     Discontinued 11/10/20 1306            Med Note Cassondra Cliff, Constantino Demark Nov 10, 2020 12:38 PM) Pt reports she does not take  Glucose Blood (BLOOD GLUCOSE TEST STRIPS) STRP [id number] Yes Use to check blood sugar up to twice daily. Jerrlyn Morel, NP Taking Active            Med Note Vallarie Gauze, [facility] R   Tue Mar 21, 2024  9:29 AM) Supply; PRN d/t CGM  Lancet Device MISC [id number] No Use to check blood sugar up to twice daily. May substitute to any manufacturer covered by patient's insurance.  Patient not taking: Reported on 03/21/2024   Jerrlyn Morel, NP Not Taking Active            Med Note Vallarie Gauze, [facility] R   Tue Mar 21, 2024  9:29 AM) PRN d/t CGM; Supply  meclizine (ANTIVERT) 25 MG tablet [id number] Yes Take 1 tablet (25 mg total) by mouth 3 (three) times daily as needed for dizziness. Carin Charleston, MD Taking Active   medroxyPROGESTERone (DEPO-PROVERA) injection 150 mg [id number]   Abigail Abler, MD  Active   medroxyPROGESTERone (PROVERA) 10 MG tablet [id number] No Take 1 tablet (10 mg total) by mouth daily.  Patient not taking: Reported on 03/21/2024   Tresia Fruit, MD Not Taking Active   medroxyPROGESTERone Acetate 150 MG/ML SUSY [id number] No Inject 1 mL (150 mg total) into the muscle every 3 (three) months.  Patient not taking:  Reported on 03/21/2024   Abigail Abler, MD Not Taking Active     Discontinued 09/30/23 1422   metFORMIN (GLUCOPHAGE-XR) 500 MG 24 hr tablet [id number] Yes Take 2 tablets (1,000 mg total) by mouth 2 (two) times daily with a meal. Jerrlyn Morel, NP Taking Active   omeprazole (PRILOSEC) 20 MG capsule [id number] No Take 1 capsule (20 mg total) by mouth daily.  Patient not taking: Reported on 03/21/2024   Horton, Vonzella Guernsey, MD Not Taking Active   ondansetron (ZOFRAN) 4 MG tablet [id number] No Take 1 tablet (4 mg total) by mouth every 8 (eight) hours as needed for nausea or vomiting.  Patient not taking: Reported on 02/17/2024  Jerrlyn Morel, NP Not Taking Active   oxyCODONE-acetaminophen (PERCOCET) 7.5-325 MG tablet [id number] No Take 1 tablet by mouth every 6 (six) hours as needed for severe pain (pain score 7-10).  Patient not taking: Reported on 02/17/2024   Carin Charleston, MD Not Taking Active   pregabalin (LYRICA) 150 MG capsule [id number] Yes Take 1 capsule (150 mg total) by mouth 2 (two) times daily. Jerrlyn Morel, NP Taking Active   Semaglutide, 2 MG/DOSE, (OZEMPIC, 2 MG/DOSE,) 8 MG/3ML SOPN [id number] Yes Inject 2 mg as directed once a week. Jerrlyn Morel, NP Taking Active   traZODone (DESYREL) 150 MG tablet [id number] Yes Take 1 tablet (150 mg total) by mouth at bedtime. Jerrlyn Morel, NP Taking Active   triamcinolone (KENALOG) 0.025 % ointment [id number] No Apply 1 Application topically 2 (two) times daily.  Patient not taking: Reported on 03/21/2024   Paseda, Folashade R, FNP Not Taking Active   valsartan-hydrochlorothiazide (DIOVAN-HCT) 80-12.5 MG tablet [id number] Yes Take 1 tablet by mouth daily. Jerrlyn Morel, NP Taking Active               Assessment/Plan:   Patient reports that she has a lot of things happening in her personal life in which she has not been able to prioritize her health such as taking care of an older/elderly family member. Although SDOH needs were  identified patient DECLINED services. She also states that she occasionally forgets to take her medication, even with adherence packaging, if she is gone all day. Discussed putting that day's adherence strip in her purse/bag to take with her while she out.    She states that she does not need test strips for BGM or any diabetic supplies. Encouraged patient to test blood glucose periodically.   Diabetes: - Currently uncontrolled - Reviewed long term cardiovascular and renal outcomes of uncontrolled blood sugar - Reviewed goal A1c, goal fasting, and goal 2 hour post prandial glucose - Recommend to continue current therapy. Will need to assess financial resources for medication adjustment.  Will discuss with patient options at the next appointment.  - Recommend to check glucose periodically alternating fasting and post prandial blood glucose, document, and provide at future appointments  - Meets financial criteria for patient assistance program through [facility]. Will collaborate with provider, CPhT, and patient to pursue assistance. Will defer to PCP for repeat A1c at the next appointment.      Hypertension: - Currently uncontrolled - Reviewed long term cardiovascular and renal outcomes of uncontrolled blood pressure - Reviewed appropriate blood pressure monitoring technique and reviewed goal blood pressure. Recommended to check home blood pressure and heart rate periodically, document, and provide at future appointments  - Recommend to increase valsartan/hydrochlorothiazide 80/12.5 mg daily to valsartan/hydrochlorothiazide 160/12.5 mg daily. However, patient declined and stated that she would like to make lifestyle adjustments and see what happens when she has her appointment in June with her PCP.     Hyperlipidemia/ASCVD Risk Reduction: - Currently uncontrolled, last LDL above goal <70. Triglycerides are elevated and likely due to uncontrolled diabetes.  - Recommend to increase  atorvastatin to 80 mg daily. Patient agreeable to dose change. Will collaborate with PCP on dose adjustment. If triglycerides remain elevated, patient need to start fenofibrate with possibly adjusting statin therapy due to drug interaction. Will defer PCP for repeat lipid panel at the next appointment.   Follow Up Plan: 1-2 weeks  Alexandria Angel, PharmD Clinical Pharmacist Cell: [phone]

## 2024-03-28 ENCOUNTER — Other Ambulatory Visit: Payer: Self-pay

## 2024-03-29 ENCOUNTER — Other Ambulatory Visit: Payer: Self-pay

## 2024-03-29 DIAGNOSIS — Z79899 Other long term (current) drug therapy: Secondary | ICD-10-CM

## 2024-03-29 MED ORDER — ATORVASTATIN CALCIUM 40 MG PO TABS
80.0000 mg | ORAL_TABLET | Freq: Every day | ORAL | 3 refills | Status: DC
  Filled 2024-03-29: qty 90, 45d supply, fill #0
  Filled 2024-04-13: qty 60, 30d supply, fill #0
  Filled 2024-05-01 – 2024-05-11 (×2): qty 60, 30d supply, fill #1
  Filled 2024-06-08 – 2024-06-20 (×2): qty 60, 30d supply, fill #2
  Filled 2024-07-24 – 2024-07-25 (×2): qty 60, 30d supply, fill #3
  Filled 2024-08-21: qty 60, 30d supply, fill #4

## 2024-03-29 NOTE — Progress Notes (Signed)
   03/29/2024 Name: Erica [name] MRN: [id number] DOB: November 04, 1971  Chief Complaint  Patient presents with   Diabetes   Medication Management   Brief Summary of Encounter: I called and spoke with the patient regarding the [facility] program. She is already enrolled; however, the application expired last week and needs to be renewed. The patient was notified of the need to renew but stated she did not personally enroll and believes someone else may have completed the application on her behalf. She was amenable to reapplying for the program.  We discussed initiating insulin or an oral diabetic medication. The patient expressed that she does not want to start insulin and is also not open to oral diabetic medications at this time. She acknowledged consuming a significant amount of regular sodas and stated she will work on reducing her intake to see how it affects her blood sugar by the next appointment.  Of note, patient may need adjustments for BP medication and DM medication (max dose of Ozempic and metformin) at the next encounter.   Thank you for allowing pharmacy to be a part of this patient's care. Alexandria Angel, PharmD Clinical Pharmacist Cell: [phone]

## 2024-03-29 NOTE — Patient Instructions (Signed)
 Erica [name],   I was nice speaking with you today. Check your blood pressure periodically, and any time you have concerning symptoms like headache, chest pain, dizziness, shortness of breath, or vision changes.   Our goal is less than 130/80.  To appropriately check your blood pressure, make sure you do the following:  1) Avoid caffeine, exercise, or tobacco products for 30 minutes before checking. Empty your bladder. 2) Sit with your back supported in a flat-backed chair. Rest your arm on something flat (arm of the chair, table, etc). 3) Sit still with your feet flat on the floor, resting, for at least 5 minutes.  4) Check your blood pressure. Take 1-2 readings.  5) Write down these readings and bring with you to any provider appointments.  Bring your home blood pressure machine with you to a provider's office for accuracy comparison at least once a year.   Make sure you take your blood pressure medications before you come to any office visit, even if you were asked to fast for labs.  Check your blood sugars periodically alternating days for fasting and after meal blood sugar:  1) Fasting, first thing in the morning before breakfast and  2) 2 hours after your largest meal.   For a goal A1c of less than 7%, goal fasting readings are less than 130 and goal 2 hour after meal readings are less than 180.    Erica [name], PharmD Clinical Pharmacist Cell: [phone]

## 2024-03-30 ENCOUNTER — Other Ambulatory Visit: Payer: Self-pay

## 2024-03-30 ENCOUNTER — Other Ambulatory Visit: Payer: Self-pay | Admitting: Nurse Practitioner

## 2024-03-30 DIAGNOSIS — E1165 Type 2 diabetes mellitus with hyperglycemia: Secondary | ICD-10-CM

## 2024-03-30 MED ORDER — OZEMPIC (2 MG/DOSE) 8 MG/3ML ~~LOC~~ SOPN
2.0000 mg | PEN_INJECTOR | SUBCUTANEOUS | 2 refills | Status: DC
  Filled 2024-03-30 – 2024-04-14 (×2): qty 3, 28d supply, fill #0
  Filled 2024-05-01 – 2024-05-30 (×3): qty 3, 28d supply, fill #1
  Filled 2024-05-31: qty 6, 56d supply, fill #1

## 2024-03-30 NOTE — Telephone Encounter (Signed)
 Please advise [facility]

## 2024-04-04 ENCOUNTER — Other Ambulatory Visit: Payer: Self-pay

## 2024-04-07 ENCOUNTER — Other Ambulatory Visit: Payer: Self-pay

## 2024-04-10 ENCOUNTER — Other Ambulatory Visit: Payer: Self-pay

## 2024-04-12 ENCOUNTER — Other Ambulatory Visit: Payer: Self-pay

## 2024-04-13 ENCOUNTER — Other Ambulatory Visit: Payer: Self-pay

## 2024-04-14 ENCOUNTER — Other Ambulatory Visit: Payer: Self-pay | Admitting: Nurse Practitioner

## 2024-04-14 ENCOUNTER — Other Ambulatory Visit: Payer: Self-pay

## 2024-04-14 DIAGNOSIS — E559 Vitamin D deficiency, unspecified: Secondary | ICD-10-CM

## 2024-04-14 DIAGNOSIS — R21 Rash and other nonspecific skin eruption: Secondary | ICD-10-CM

## 2024-04-14 MED ORDER — ERGOCALCIFEROL 1.25 MG (50000 UT) PO CAPS
1.0000 | ORAL_CAPSULE | ORAL | 2 refills | Status: DC
  Filled 2024-04-14: qty 4, 28d supply, fill #0
  Filled 2024-05-01 – 2024-05-11 (×2): qty 4, 28d supply, fill #1
  Filled 2024-06-08 – 2024-06-20 (×2): qty 4, 28d supply, fill #2

## 2024-04-14 MED ORDER — TRIAMCINOLONE ACETONIDE 0.025 % EX OINT
1.0000 | TOPICAL_OINTMENT | Freq: Two times a day (BID) | CUTANEOUS | 0 refills | Status: DC
  Filled 2024-04-14: qty 30, 15d supply, fill #0

## 2024-04-14 NOTE — Telephone Encounter (Signed)
 Please advise [facility]

## 2024-04-17 ENCOUNTER — Other Ambulatory Visit: Payer: Self-pay

## 2024-04-20 ENCOUNTER — Other Ambulatory Visit: Payer: Self-pay

## 2024-04-25 ENCOUNTER — Other Ambulatory Visit: Payer: Self-pay

## 2024-04-25 ENCOUNTER — Telehealth: Payer: Self-pay

## 2024-04-25 DIAGNOSIS — Z79899 Other long term (current) drug therapy: Secondary | ICD-10-CM

## 2024-04-25 DIAGNOSIS — E1165 Type 2 diabetes mellitus with hyperglycemia: Secondary | ICD-10-CM

## 2024-04-25 NOTE — Progress Notes (Signed)
   04/25/2024  Patient ID: Erica [name], female   DOB: 1971/07/23, 53 y.o.   MRN: [id number]   Attempted to contact patient for medication management/review. I was unable to leave HIPAA compliant message for patient to return my call at their convenience as the voicemail was full.   Third attempt for patient outreach. Patient was contacted to follow up on diabetes regimen, adherence, and Ozempic  PAP renewal. Will close referral as I have not been able to consistently contact patient, but happy to help with future encounters.   Thank you for allowing pharmacy to be a part of this patient's care.  Alexandria Angel, PharmD Clinical Pharmacist Cell: [phone]

## 2024-05-01 ENCOUNTER — Other Ambulatory Visit: Payer: Self-pay

## 2024-05-03 ENCOUNTER — Telehealth: Payer: Self-pay

## 2024-05-03 ENCOUNTER — Other Ambulatory Visit: Payer: Self-pay

## 2024-05-03 NOTE — Telephone Encounter (Signed)
 Copied from CRM [id number]. Topic: Appointments - Scheduling Inquiry for Clinic >> May 03, 2024 10:17 AM El Gravely T wrote: Reason for CRM: Patient requesting to reschedule depo injection to an earlier time.   Please contact patient at [phone]

## 2024-05-04 ENCOUNTER — Ambulatory Visit: Payer: Self-pay

## 2024-05-04 ENCOUNTER — Ambulatory Visit (INDEPENDENT_AMBULATORY_CARE_PROVIDER_SITE_OTHER): Payer: Self-pay

## 2024-05-04 VITALS — Ht 72.0 in | Wt 260.0 lb

## 2024-05-04 DIAGNOSIS — Z3202 Encounter for pregnancy test, result negative: Secondary | ICD-10-CM

## 2024-05-04 LAB — POCT URINE PREGNANCY: Preg Test, Ur: NEGATIVE

## 2024-05-04 MED ORDER — TESTOSTERONE CYPIONATE 200 MG/ML IM SOLN
150.0000 mg | INTRAMUSCULAR | Status: AC
Start: 2024-05-04 — End: ?
  Administered 2024-05-04: 150 mg via INTRAMUSCULAR

## 2024-05-04 NOTE — Addendum Note (Signed)
 Addended by: Ammon Bales D on: 05/04/2024 08:35 AM   Modules accepted: Orders

## 2024-05-04 NOTE — Progress Notes (Signed)
 Patient is in office today for a nurse visit for Birth Control Injection. Injection was given in the left glute, pregnancy test was negative preformed in office today by Anesa Fronek B

## 2024-05-06 ENCOUNTER — Emergency Department (HOSPITAL_BASED_OUTPATIENT_CLINIC_OR_DEPARTMENT_OTHER)
Admission: EM | Admit: 2024-05-06 | Discharge: 2024-05-06 | Disposition: A | Discharge status: Home / Self Care | Attending: Emergency Medicine | Admitting: Emergency Medicine

## 2024-05-06 ENCOUNTER — Other Ambulatory Visit: Payer: Self-pay

## 2024-05-06 ENCOUNTER — Encounter (HOSPITAL_BASED_OUTPATIENT_CLINIC_OR_DEPARTMENT_OTHER): Payer: Self-pay

## 2024-05-06 DIAGNOSIS — Z7982 Long term (current) use of aspirin: Secondary | ICD-10-CM | POA: Insufficient documentation

## 2024-05-06 DIAGNOSIS — L03116 Cellulitis of left lower limb: Secondary | ICD-10-CM

## 2024-05-06 DIAGNOSIS — Z7984 Long term (current) use of oral hypoglycemic drugs: Secondary | ICD-10-CM | POA: Insufficient documentation

## 2024-05-06 DIAGNOSIS — E119 Type 2 diabetes mellitus without complications: Secondary | ICD-10-CM | POA: Insufficient documentation

## 2024-05-06 MED ORDER — CEPHALEXIN 500 MG PO CAPS: 1000.0000 mg | ORAL_CAPSULE | Freq: Two times a day (BID) | ORAL | 0 refills | Status: DC

## 2024-05-06 MED ORDER — DOXYCYCLINE HYCLATE 100 MG PO CAPS: 100.0000 mg | ORAL_CAPSULE | Freq: Two times a day (BID) | ORAL | 0 refills | Status: DC

## 2024-05-06 MED ORDER — CEPHALEXIN 500 MG PO CAPS
1000.0000 mg | ORAL_CAPSULE | Freq: Two times a day (BID) | ORAL | 0 refills | Status: AC
  Filled 2024-05-06 – 2024-05-30 (×3): qty 40, 10d supply, fill #0

## 2024-05-06 MED ORDER — DOXYCYCLINE HYCLATE 100 MG PO TABS
100.0000 mg | ORAL_TABLET | Freq: Two times a day (BID) | ORAL | 0 refills | Status: AC
  Filled 2024-05-06 – 2024-05-30 (×4): qty 14, 7d supply, fill #0

## 2024-05-06 MED ORDER — DOXYCYCLINE HYCLATE 100 MG PO TABS
100.0000 mg | ORAL_TABLET | Freq: Once | ORAL | Status: AC
  Administered 2024-05-06: 100 mg via ORAL
  Filled 2024-05-06: qty 1

## 2024-05-06 MED ORDER — CEPHALEXIN 250 MG PO CAPS
1000.0000 mg | ORAL_CAPSULE | Freq: Once | ORAL | Status: AC
  Administered 2024-05-06: 1000 mg via ORAL
  Filled 2024-05-06: qty 4

## 2024-05-06 NOTE — Discharge Instructions (Signed)
 Please return for rapid spreading or if you develop a fever.  Please follow-up with your family doctor and podiatrist in the office.

## 2024-05-06 NOTE — ED Triage Notes (Signed)
 Pt noticed a small ulcer on left little toe x 2 days and noticed redness and swelling going up left foot and lower leg. Pt is diabetic and is prescribed Metformin  but admits to not taking it as prescribed.

## 2024-05-06 NOTE — ED Notes (Signed)
Reviewed discharge instructions, medications, and home care with pt. Pt verbalized understanding and had no further questions. Pt exited ED without complications.

## 2024-05-06 NOTE — ED Provider Notes (Signed)
 [place] EMERGENCY DEPARTMENT AT [facility] Provider Note   CSN: [id number] Arrival date & time: 05/06/24  1927     History  Chief Complaint  Patient presents with   Cellulitis    Erica [name] is a 53 y.o. female.  53 yo F with a chief complaints of an ulcer to her left foot.  She noticed this yesterday.  Since then has developed some redness surrounding that has spread up her leg.  She denies any fevers or chills.  She otherwise has been doing well.  Has a history of diabetes and has some decreased sensation to her feet.  Denies obvious injury.        Home Medications Prior to Admission medications   Medication Sig Start Date End Date Taking? Authorizing Provider  cephALEXin (KEFLEX) 500 MG capsule Take 2 capsules (1,000 mg total) by mouth 2 (two) times daily for 10 days. 05/06/24 05/16/24 Yes Albertus Hughs, DO  doxycycline  (VIBRAMYCIN ) 100 MG capsule Take 1 capsule (100 mg total) by mouth 2 (two) times daily. One po bid x 7 days 05/06/24  Yes Albertus Hughs, DO  aspirin  81 MG tablet Take 81 mg by mouth at bedtime.     [provider]  atorvastatin  (LIPITOR) 40 MG tablet Take 2 tablets (80 mg total) by mouth daily. 03/29/24   Jerrlyn Morel, NP  Blood Glucose Monitoring Suppl (TRUE METRIX METER) w/Device KIT Use to check blood sugar up to twice daily. 08/03/23   Jerrlyn Morel, NP  buPROPion  (WELLBUTRIN  XL) 150 MG 24 hr tablet Take 1 tablet (150 mg total) by mouth every morning. 02/14/24   Jerrlyn Morel, NP  ergocalciferol  (VITAMIN D2) 1.25 MG (50000 UT) capsule Take 1 capsule by mouth once a week. 02/23/24   Jerrlyn Morel, NP  ergocalciferol  (VITAMIN D2) 1.25 MG (50000 UT) capsule Take 1 capsule by mouth once a week. 04/14/24   Jerrlyn Morel, NP  escitalopram  (LEXAPRO ) 20 MG tablet Take 1 tablet (20 mg total) by mouth daily. 09/22/23   Jerrlyn Morel, NP  Fluocinolone  Acetonide Body 0.01 % OIL Use as needed to scalp 12/23/23   Nichols, Tonya S, NP  Glucose Blood  (BLOOD GLUCOSE TEST STRIPS) STRP Use to check blood sugar up to twice daily. 08/03/23   Jerrlyn Morel, NP  Lancet Device MISC Use to check blood sugar up to twice daily. May substitute to any manufacturer covered by patient's insurance. Patient not taking: Reported on 03/21/2024 08/03/23   Jerrlyn Morel, NP  meclizine  (ANTIVERT ) 25 MG tablet Take 1 tablet (25 mg total) by mouth 3 (three) times daily as needed for dizziness. 11/22/23   Carin Charleston, MD  medroxyPROGESTERone  (PROVERA ) 10 MG tablet Take 1 tablet (10 mg total) by mouth daily. Patient not taking: Reported on 03/21/2024 11/15/23   Tresia Fruit, MD  medroxyPROGESTERone  Acetate 150 MG/ML SUSY Inject 1 mL (150 mg total) into the muscle every 3 (three) months. Patient not taking: Reported on 03/21/2024 12/02/23   Abigail Abler, MD  metFORMIN  (GLUCOPHAGE -XR) 500 MG 24 hr tablet Take 2 tablets (1,000 mg total) by mouth 2 (two) times daily with a meal. 09/30/23   Jerrlyn Morel, NP  omeprazole  (PRILOSEC) 20 MG capsule Take 1 capsule (20 mg total) by mouth daily. Patient not taking: Reported on 03/21/2024 02/05/24   Horton, Vonzella Guernsey, MD  ondansetron  (ZOFRAN ) 4 MG tablet Take 1 tablet (4 mg total) by mouth every 8 (eight) hours as  needed for nausea or vomiting. Patient not taking: Reported on 02/17/2024 09/22/23   Nichols, Tonya S, NP  oxyCODONE -acetaminophen  (PERCOCET) 7.5-325 MG tablet Take 1 tablet by mouth every 6 (six) hours as needed for severe pain (pain score 7-10). Patient not taking: Reported on 02/17/2024 11/22/23   Carin Charleston, MD  pregabalin  (LYRICA ) 150 MG capsule Take 1 capsule (150 mg total) by mouth 2 (two) times daily. 10/15/23   Jerrlyn Morel, NP  Semaglutide , 2 MG/DOSE, (OZEMPIC , 2 MG/DOSE,) 8 MG/3ML SOPN Inject 2 mg as directed once a week. 03/30/24   Jerrlyn Morel, NP  traZODone  (DESYREL ) 150 MG tablet Take 1 tablet (150 mg total) by mouth at bedtime. 02/17/24   Nichols, Tonya S, NP  triamcinolone  (KENALOG ) 0.025 % ointment  Apply 1 Application topically 2 (two) times daily. 04/14/24   Jerrlyn Morel, NP  valsartan -hydrochlorothiazide  (DIOVAN -HCT) 80-12.5 MG tablet Take 1 tablet by mouth daily. 12/23/23   Nichols, Tonya S, NP  amitriptyline (ELAVIL) 50 MG tablet amitriptyline 50 mg tablet  Take 1 tablet(s) every day by oral route as directed for 30 days.  11/10/20  [provider]  gabapentin (NEURONTIN) 300 MG capsule Take 300 mg by mouth.  11/10/20  [provider]  metFORMIN  (GLUCOPHAGE ) 1000 MG tablet Take 1 tablet (1,000 mg total) by mouth 2 (two) times daily with a meal. 09/22/23   Jerrlyn Morel, NP      Allergies    Penicillins    Review of Systems   Review of Systems  Physical Exam Updated Vital Signs BP (!) 141/87   Pulse (!) 108   Temp 98.3 F (36.8 C) (Oral)   Resp 18   SpO2 100%  Physical Exam Vitals and nursing note reviewed.  Constitutional:      General: She is not in acute distress.    Appearance: She is well-developed. She is not diaphoretic.  HENT:     Head: Normocephalic and atraumatic.  Eyes:     Pupils: Pupils are equal, round, and reactive to light.  Cardiovascular:     Rate and Rhythm: Normal rate and regular rhythm.     Heart sounds: No murmur heard.    No friction rub. No gallop.  Pulmonary:     Effort: Pulmonary effort is normal.     Breath sounds: No wheezing or rales.  Abdominal:     General: There is no distension.     Palpations: Abdomen is soft.     Tenderness: There is no abdominal tenderness.  Musculoskeletal:        General: No tenderness.     Cervical back: Normal range of motion and neck supple.     Comments: Ulceration to the dorsum of the left fifth digit.  There is some red streaking that goes up the foot and up the medial aspect of her leg.  Warm to touch.  Pulse motor and sensation intact.  Skin:    General: Skin is warm and dry.  Neurological:     Mental Status: She is alert and oriented to person, place, and time.  Psychiatric:         Behavior: Behavior normal.     ED Results / Procedures / Treatments   Labs (all labs ordered are listed, but only abnormal results are displayed) Labs Reviewed - No data to display  EKG None  Radiology No results found.  Procedures Procedures    Medications Ordered in ED Medications  cephALEXin (KEFLEX) capsule 1,000 mg (has  no administration in time range)  doxycycline  (VIBRA -TABS) tablet 100 mg (has no administration in time range)    ED Course/ Medical Decision Making/ A&P                                 Medical Decision Making Risk Prescription drug management.   53 yo F with a chief complaints of a diabetic foot wound.  Noticed yesterday.  Now with some redness streaking up her leg.  I did offer admission.  Patient elected to trial antibiotics first.  Encouraged her to follow-up with her PCP and podiatry.  8:27 PM:  I have discussed the diagnosis/risks/treatment options with the patient and family.  Evaluation and diagnostic testing in the emergency department does not suggest an emergent condition requiring admission or immediate intervention beyond what has been performed at this time.  They will follow up with PCP. We also discussed returning to the ED immediately if new or worsening sx occur. We discussed the sx which are most concerning (e.g., sudden worsening pain, fever, inability to tolerate by mouth) that necessitate immediate return. Medications administered to the patient during their visit and any new prescriptions provided to the patient are listed below.  Medications given during this visit Medications  cephALEXin  (KEFLEX ) capsule 1,000 mg (has no administration in time range)  doxycycline  (VIBRA -TABS) tablet 100 mg (has no administration in time range)     The patient appears reasonably screen and/or stabilized for discharge and I doubt any other medical condition or other [facility] requiring further screening, evaluation, or treatment in the ED at  this time prior to discharge.          Final Clinical Impression(s) / ED Diagnoses Final diagnoses:  Cellulitis of left lower extremity    Rx / DC Orders ED Discharge Orders          Ordered    cephALEXin  (KEFLEX ) 500 MG capsule  2 times daily        05/06/24 2021    doxycycline  (VIBRAMYCIN ) 100 MG capsule  2 times daily        05/06/24 2021              Albertus Hughs, DO 05/06/24 2027

## 2024-05-07 ENCOUNTER — Other Ambulatory Visit: Payer: Self-pay

## 2024-05-09 ENCOUNTER — Other Ambulatory Visit: Payer: Self-pay

## 2024-05-09 ENCOUNTER — Telehealth: Payer: Self-pay

## 2024-05-09 NOTE — Transitions of Care (Post Inpatient/ED Visit) (Unsigned)
 05/09/2024  Name: Erica [name] MRN: [id number] DOB: 02-Nov-1971  Today's TOC FU Call Status:   Patient's Name and Date of Birth confirmed.  Transition Care Management Follow-up Telephone Call Date of Discharge: 05/06/24 Discharge Facility: [facility] ([facility]) Type of Discharge: Emergency Department Reason for ED Visit: Other: How have you been since you were released from the hospital?: Better Any questions or concerns?: No  Items Reviewed: Did you receive and understand the discharge instructions provided?: Yes Medications obtained,verified, and reconciled?: Yes (Medications Reviewed) Any new allergies since your discharge?: No Dietary orders reviewed?: No Do you have support at home?: Yes People in Home [RPT]: child(ren), adult  Medications Reviewed Today: Medications Reviewed Today     Reviewed by Angelita Bares, CMA (Certified Medical Assistant) on 05/09/24 at 1602  Med List Status: <None>   Medication Order Taking? Sig Documenting Provider Last Dose Status Informant    Discontinued 11/10/20 1306            Med Note Alwyn Juba Nov 10, 2020 12:39 PM) Pt does not take  aspirin  81 MG tablet [id number] Yes Take 81 mg by mouth at bedtime.  [provider] Taking Active Self  atorvastatin  (LIPITOR) 40 MG tablet [id number] Yes Take 2 tablets (80 mg total) by mouth daily. Jerrlyn Morel, NP Taking Active   Blood Glucose Monitoring Suppl (TRUE METRIX METER) w/Device KIT [id number] Yes Use to check blood sugar up to twice daily. Jerrlyn Morel, NP Taking Active            Med Note Vallarie Gauze, [facility] R   Tue Mar 21, 2024  9:27 AM) supply  buPROPion  (WELLBUTRIN  XL) 150 MG 24 hr tablet [id number] Yes Take 1 tablet (150 mg total) by mouth every morning. Jerrlyn Morel, NP Taking Active   cephALEXin  (KEFLEX ) 500 MG capsule [id number] Yes Take 2 capsules (1,000 mg total) by mouth 2 (two) times daily for 10 days. Albertus Hughs, DO Taking Active   doxycycline   (VIBRA -TABS) 100 MG tablet [id number] Yes Take 1 tablet (100 mg total) by mouth 2 (two) times daily for 7 days. Albertus Hughs, DO Taking Active   ergocalciferol  (VITAMIN D2) 1.25 MG (50000 UT) capsule [id number] Yes Take 1 capsule by mouth once a week. Jerrlyn Morel, NP Taking Active   ergocalciferol  (VITAMIN D2) 1.25 MG (50000 UT) capsule [id number] Yes Take 1 capsule by mouth once a week. Jerrlyn Morel, NP Taking Active   escitalopram  (LEXAPRO ) 20 MG tablet [id number] Yes Take 1 tablet (20 mg total) by mouth daily. Jerrlyn Morel, NP Taking Active   Fluocinolone  Acetonide Body 0.01 % OIL [id number] Yes Use as needed to scalp Jerrlyn Morel, NP Taking Active     Discontinued 11/10/20 1306            Med Note Cassondra Cliff, Constantino Demark Nov 10, 2020 12:38 PM) Pt reports she does not take  Glucose Blood (BLOOD GLUCOSE TEST STRIPS) STRP [id number] Yes Use to check blood sugar up to twice daily. Jerrlyn Morel, NP Taking Active            Med Note Vallarie Gauze, [facility] R   Tue Mar 21, 2024  9:29 AM) Supply; PRN d/t CGM  Lancet Device MISC [id number] No Use to check blood sugar up to twice daily. May substitute to any manufacturer covered by patient's insurance.  Patient not taking: Reported on 05/09/2024   Jerrlyn Morel, NP Not Taking Active  Med Note Vallarie Gauze, [facility] R   Tue Mar 21, 2024  9:29 AM) PRN d/t CGM; Supply  meclizine  (ANTIVERT ) 25 MG tablet [id number] Yes Take 1 tablet (25 mg total) by mouth 3 (three) times daily as needed for dizziness. Carin Charleston, MD Taking Active   medroxyPROGESTERone  (DEPO-PROVERA ) injection 150 mg [id number]   Abigail Abler, MD  Active   medroxyPROGESTERone  (PROVERA ) 10 MG tablet [id number]  Take 1 tablet (10 mg total) by mouth daily.  Patient not taking: Reported on 03/21/2024   Tresia Fruit, MD  Active   medroxyPROGESTERone  Acetate 150 MG/ML SUSY [id number]  Inject 1 mL (150 mg total) into the muscle every 3 (three) months.  Patient not taking: Reported on  03/21/2024   Abigail Abler, MD  Active     Discontinued 09/30/23 1422   metFORMIN  (GLUCOPHAGE -XR) 500 MG 24 hr tablet [id number]  Take 2 tablets (1,000 mg total) by mouth 2 (two) times daily with a meal. Jerrlyn Morel, NP  Active   omeprazole  (PRILOSEC) 20 MG capsule [id number]  Take 1 capsule (20 mg total) by mouth daily.  Patient not taking: Reported on 03/21/2024   Horton, Vonzella Guernsey, MD  Active   ondansetron  (ZOFRAN ) 4 MG tablet [id number]  Take 1 tablet (4 mg total) by mouth every 8 (eight) hours as needed for nausea or vomiting.  Patient not taking: Reported on 02/17/2024   Jerrlyn Morel, NP  Active   oxyCODONE -acetaminophen  (PERCOCET) 7.5-325 MG tablet [id number]  Take 1 tablet by mouth every 6 (six) hours as needed for severe pain (pain score 7-10).  Patient not taking: Reported on 02/17/2024   Carin Charleston, MD  Active   pregabalin  (LYRICA ) 150 MG capsule [id number] Yes Take 1 capsule (150 mg total) by mouth 2 (two) times daily. Jerrlyn Morel, NP Taking Active   Semaglutide , 2 MG/DOSE, (OZEMPIC , 2 MG/DOSE,) 8 MG/3ML SOPN [id number] Yes Inject 2 mg as directed once a week. Jerrlyn Morel, NP Taking Active   testosterone cypionate (DEPOTESTOSTERONE CYPIONATE) injection 150 mg [id number]   Nichols, Tonya S, NP  Active   traZODone  (DESYREL ) 150 MG tablet [id number] Yes Take 1 tablet (150 mg total) by mouth at bedtime. Jerrlyn Morel, NP Taking Active   triamcinolone  (KENALOG ) 0.025 % ointment [id number] Yes Apply 1 Application topically 2 (two) times daily. Jerrlyn Morel, NP Taking Active   valsartan -hydrochlorothiazide  (DIOVAN -HCT) 80-12.5 MG tablet [id number] Yes Take 1 tablet by mouth daily. Jerrlyn Morel, NP Taking Active             Home Care and Equipment/Supplies: Were Home Health Services Ordered?: NA Any new equipment or medical supplies ordered?: NA  Functional Questionnaire: Do you need assistance with bathing/showering or dressing?: No Do you need assistance with  meal preparation?: No Do you need assistance with eating?: No Do you have difficulty maintaining continence: No Do you need assistance with getting out of bed/getting out of a chair/moving?: No Do you have difficulty managing or taking your medications?: No  Follow up appointments reviewed: PCP Follow-up appointment confirmed?: Yes Date of PCP follow-up appointment?: 05/18/24 Specialist Hospital Follow-up appointment confirmed?: NA Do you need transportation to your follow-up appointment?: No Do you understand care options if your condition(s) worsen?: Yes-patient verbalized understanding  SDOH Interventions Today    Flowsheet Row Most Recent Value  SDOH Interventions   Food Insecurity Interventions Intervention Not Indicated       SIGNATURE Kanishk Stroebel, RMA

## 2024-05-11 ENCOUNTER — Other Ambulatory Visit: Payer: Self-pay

## 2024-05-12 ENCOUNTER — Other Ambulatory Visit: Payer: Self-pay

## 2024-05-18 ENCOUNTER — Other Ambulatory Visit: Payer: Self-pay

## 2024-05-18 ENCOUNTER — Inpatient Hospital Stay: Payer: Self-pay | Admitting: Nurse Practitioner

## 2024-05-19 ENCOUNTER — Other Ambulatory Visit: Payer: Self-pay

## 2024-05-25 ENCOUNTER — Other Ambulatory Visit: Payer: Self-pay

## 2024-05-30 ENCOUNTER — Other Ambulatory Visit: Payer: Self-pay

## 2024-05-31 ENCOUNTER — Other Ambulatory Visit: Payer: Self-pay

## 2024-06-01 ENCOUNTER — Other Ambulatory Visit: Payer: Self-pay

## 2024-06-02 ENCOUNTER — Ambulatory Visit: Payer: Self-pay | Admitting: Nurse Practitioner

## 2024-06-04 ENCOUNTER — Telehealth: Payer: Self-pay | Admitting: Nurse Practitioner

## 2024-06-04 NOTE — Telephone Encounter (Signed)
 Patient was identified as falling into the [place] Measure - Diabetes.   Patient was: Appointment already scheduled for:  07/17/24. Pt canceled appt for 06/02/24

## 2024-06-06 ENCOUNTER — Other Ambulatory Visit: Payer: Self-pay

## 2024-06-08 ENCOUNTER — Other Ambulatory Visit: Payer: Self-pay

## 2024-06-08 ENCOUNTER — Other Ambulatory Visit: Payer: Self-pay | Admitting: Nurse Practitioner

## 2024-06-08 MED ORDER — METFORMIN HCL ER 500 MG PO TB24
1000.0000 mg | ORAL_TABLET | Freq: Two times a day (BID) | ORAL | 1 refills | Status: AC
  Filled 2024-06-20: qty 120, 30d supply, fill #0
  Filled 2024-07-24 – 2024-07-25 (×2): qty 120, 30d supply, fill #1
  Filled 2024-08-21: qty 120, 30d supply, fill #2
  Filled 2024-10-23: qty 120, 30d supply, fill #3
  Filled 2024-11-17: qty 120, 30d supply, fill #4
  Filled 2024-12-22: qty 120, 30d supply, fill #5

## 2024-06-09 ENCOUNTER — Ambulatory Visit: Payer: Self-pay

## 2024-06-09 NOTE — Telephone Encounter (Signed)
 FYI Only or Action Required?: Action required by provider: request for appointment and update on patient condition.  Patient was last seen in primary care on 02/22/2024 by Paseda, Folashade R, FNP. Called Nurse Triage reporting Wound Infection. Symptoms began several weeks ago. Interventions attempted: Nothing. Symptoms are: gradually worsening.  Triage Disposition: See Physician Within 24 Hours  Patient/caregiver understands and will follow disposition?: No, wishes to speak with PCP     Copied from CRM [id number]. Topic: Clinical - Red Word Triage >> Jun 09, 2024 11:47 AM Nathanel BROCKS wrote: Red Word that prompted transfer to Nurse Triage:   Sore on left toe, she is a diabetic. She stated that she went to the [facility] and they treated it but its not healing right and is afraid she may loose her toe. May need another round of antibiotics.  ----------------------------------------------------------------------- From previous Reason for Contact - Scheduling: Patient/patient representative is calling to schedule an appointment. Refer to attachments for appointment information. Reason for Disposition  [1] Finished taking antibiotic AND [2] wound infection symptoms are WORSE (e.g., draining pus, pain, red streak, spreading redness, swelling)  Answer Assessment - Initial Assessment Questions 1. SYMPTOM: What's the main symptom you're concerned about? (e.g., redness, swelling, pain, fever, weakness)     Deep red to purple 2. WOUND INFECTION LOCATION: Where is the wound infection located? (e.g., arm, face, foot, knee, leg)     Pinky toe on left foot 3. WOUND INFECTION SIZE: What is the size of the red area? (e.g., inches, centimeters; compare to size of a coin)      Whole toe  4. BETTER-SAME-WORSE: Are you getting better, staying the same, or getting worse compared to the day you started the antibiotics?      worse 5. PAIN: Do you have any pain?  If Yes, ask: How bad is the pain?  (e.g., Scale  1-10; mild, moderate, or severe)    - MILD (1-3): Doesn't interfere with normal activities.     - MODERATE (4-7): Interferes with normal activities or awakens from sleep.    - SEVERE (8-10): Excruciating pain, unable to do any normal activities.       5/10 6. FEVER: Do you have a fever? If Yes, ask: What is it, how was it measured and when did it start?     no 7. OTHER SYMPTOMS: Do you have any other symptoms? (e.g., pus coming from a wound, red streaks, weakness)     Look like rings and maybe pus inside the foot 8. DIAGNOSIS DATE: When was the wound infection diagnosed? By whom?      ED 9. ANTIBIOTIC NAME: What antibiotic(s) are you taking?  How many times a day? Note: Be sure the patient is taking the antibiotic as directed.      clindamycin 10. ANTIBIOTIC DATE: When was the antibiotic started?       Finished 2 weeks ago 11. FOLLOW-UP APPOINTMENT: Do you have a follow-up appointment with your doctor?       Miss appt  Protocols used: Wound Infection on Antibiotic Follow-up Call-A-AH

## 2024-06-20 ENCOUNTER — Other Ambulatory Visit: Payer: Self-pay

## 2024-06-21 ENCOUNTER — Other Ambulatory Visit: Payer: Self-pay

## 2024-06-21 ENCOUNTER — Other Ambulatory Visit: Payer: Self-pay | Admitting: Nurse Practitioner

## 2024-06-21 DIAGNOSIS — E1165 Type 2 diabetes mellitus with hyperglycemia: Secondary | ICD-10-CM

## 2024-06-21 MED ORDER — OZEMPIC (2 MG/DOSE) 8 MG/3ML ~~LOC~~ SOPN
2.0000 mg | PEN_INJECTOR | SUBCUTANEOUS | 2 refills | Status: DC
  Filled 2024-06-21 – 2024-07-25 (×4): qty 3, 28d supply, fill #0
  Filled 2024-08-21: qty 3, 28d supply, fill #1
  Filled 2024-08-28 – 2024-09-21 (×2): qty 3, 28d supply, fill #0
  Filled 2024-10-30: qty 3, 28d supply, fill #1

## 2024-06-22 ENCOUNTER — Other Ambulatory Visit: Payer: Self-pay

## 2024-06-24 ENCOUNTER — Encounter (HOSPITAL_BASED_OUTPATIENT_CLINIC_OR_DEPARTMENT_OTHER): Payer: Self-pay

## 2024-06-24 ENCOUNTER — Emergency Department (HOSPITAL_BASED_OUTPATIENT_CLINIC_OR_DEPARTMENT_OTHER): Admission: EM | Admit: 2024-06-24 | Discharge: 2024-06-24 | Disposition: A | Discharge status: Home / Self Care

## 2024-06-24 ENCOUNTER — Other Ambulatory Visit: Payer: Self-pay

## 2024-06-24 DIAGNOSIS — L03119 Cellulitis of unspecified part of limb: Secondary | ICD-10-CM

## 2024-06-24 DIAGNOSIS — Z7982 Long term (current) use of aspirin: Secondary | ICD-10-CM | POA: Insufficient documentation

## 2024-06-24 DIAGNOSIS — Z7984 Long term (current) use of oral hypoglycemic drugs: Secondary | ICD-10-CM | POA: Insufficient documentation

## 2024-06-24 DIAGNOSIS — Z794 Long term (current) use of insulin: Secondary | ICD-10-CM | POA: Insufficient documentation

## 2024-06-24 DIAGNOSIS — L97429 Non-pressure chronic ulcer of left heel and midfoot with unspecified severity: Secondary | ICD-10-CM | POA: Insufficient documentation

## 2024-06-24 DIAGNOSIS — E11621 Type 2 diabetes mellitus with foot ulcer: Secondary | ICD-10-CM | POA: Insufficient documentation

## 2024-06-24 DIAGNOSIS — L03116 Cellulitis of left lower limb: Secondary | ICD-10-CM | POA: Insufficient documentation

## 2024-06-24 DIAGNOSIS — L97521 Non-pressure chronic ulcer of other part of left foot limited to breakdown of skin: Secondary | ICD-10-CM

## 2024-06-24 MED ORDER — CEPHALEXIN 500 MG PO CAPS
500.0000 mg | ORAL_CAPSULE | Freq: Four times a day (QID) | ORAL | 0 refills | Status: DC
  Filled 2024-06-24 – 2024-06-26 (×2): qty 28, 7d supply, fill #0

## 2024-06-24 MED ORDER — DOXYCYCLINE HYCLATE 100 MG PO CAPS
100.0000 mg | ORAL_CAPSULE | Freq: Two times a day (BID) | ORAL | 0 refills | Status: DC
  Filled 2024-06-24 – 2024-06-26 (×2): qty 14, 7d supply, fill #0

## 2024-06-24 NOTE — ED Provider Notes (Signed)
 [place] EMERGENCY DEPARTMENT AT [facility] Provider Note   CSN: [id number] Arrival date & time: 06/24/24  9244     Patient presents with: Toe Pain   Erica [name] is a 53 y.o. female.   53 year old female presents for evaluation of left fifth toe wound.  States she noticed it yesterday.  States she has had the wound off-and-on for a while and was felt was a callus.  Has not followed up with podiatry.  States last night at work she was walking and noticed some pain to her toe and some redness on her foot.  Denies any fevers, chills, nausea, vomiting, or any other symptoms or concerns at this time.   Toe Pain Pertinent negatives include no chest pain, no abdominal pain and no shortness of breath.       Prior to Admission medications   Medication Sig Start Date End Date Taking? Authorizing Provider  cephALEXin  (KEFLEX ) 500 MG capsule Take 1 capsule (500 mg total) by mouth 4 (four) times daily for 7 days. 06/24/24 07/01/24 Yes Margy Sumler L, DO  doxycycline  (VIBRAMYCIN ) 100 MG capsule Take 1 capsule (100 mg total) by mouth 2 (two) times daily for 7 days. 06/24/24 07/01/24 Yes Jahnessa Vanduyn L, DO  aspirin  81 MG tablet Take 81 mg by mouth at bedtime.     [provider]  atorvastatin  (LIPITOR) 40 MG tablet Take 2 tablets (80 mg total) by mouth daily. 03/29/24   [name], NP  Blood Glucose Monitoring Suppl (TRUE METRIX METER) w/Device KIT Use to check blood sugar up to twice daily. 08/03/23   [name], NP  buPROPion  (WELLBUTRIN  XL) 150 MG 24 hr tablet Take 1 tablet (150 mg total) by mouth every morning. 02/14/24   [name], NP  ergocalciferol  (VITAMIN D2) 1.25 MG (50000 UT) capsule Take 1 capsule by mouth once a week. 02/23/24   [name], NP  ergocalciferol  (VITAMIN D2) 1.25 MG (50000 UT) capsule Take 1 capsule by mouth once a week. 04/14/24   [name], NP  escitalopram  (LEXAPRO ) 20 MG tablet Take 1 tablet (20 mg total) by mouth daily.  09/22/23   [name], NP  Fluocinolone  Acetonide Body 0.01 % OIL Use as needed to scalp 12/23/23   Nichols, Tonya S, NP  Glucose Blood (BLOOD GLUCOSE TEST STRIPS) STRP Use to check blood sugar up to twice daily. 08/03/23   [name], NP  Lancet Device MISC Use to check blood sugar up to twice daily. May substitute to any manufacturer covered by patient's insurance. Patient not taking: Reported on 05/09/2024 08/03/23   [name], NP  meclizine  (ANTIVERT ) 25 MG tablet Take 1 tablet (25 mg total) by mouth 3 (three) times daily as needed for dizziness. 11/22/23   Ula Prentice SAUNDERS, MD  medroxyPROGESTERone  (PROVERA ) 10 MG tablet Take 1 tablet (10 mg total) by mouth daily. Patient not taking: Reported on 03/21/2024 11/15/23   Eveline Lynwood MATSU, MD  medroxyPROGESTERone  Acetate 150 MG/ML SUSY Inject 1 mL (150 mg total) into the muscle every 3 (three) months. Patient not taking: Reported on 03/21/2024 12/02/23   Zina Jerilynn LABOR, MD  metFORMIN  (GLUCOPHAGE -XR) 500 MG 24 hr tablet Take 2 tablets (1,000 mg total) by mouth 2 (two) times daily with a meal. 06/08/24   [name], NP  omeprazole  (PRILOSEC) 20 MG capsule Take 1 capsule (20 mg total) by mouth daily. Patient not taking: Reported on 03/21/2024 02/05/24   Horton, Charmaine  F, MD  ondansetron  (ZOFRAN ) 4 MG tablet Take 1 tablet (4 mg total) by mouth every 8 (eight) hours as needed for nausea or vomiting. Patient not taking: Reported on 02/17/2024 09/22/23   Nichols, Tonya S, NP  oxyCODONE -acetaminophen  (PERCOCET) 7.5-325 MG tablet Take 1 tablet by mouth every 6 (six) hours as needed for severe pain (pain score 7-10). Patient not taking: Reported on 02/17/2024 11/22/23   Ula Prentice SAUNDERS, MD  pregabalin  (LYRICA ) 150 MG capsule Take 1 capsule (150 mg total) by mouth 2 (two) times daily. 10/15/23   [name], NP  Semaglutide , 2 MG/DOSE, (OZEMPIC , 2 MG/DOSE,) 8 MG/3ML SOPN Inject 2 mg as directed once a week. 06/21/24   [name], NP  traZODone   (DESYREL ) 150 MG tablet Take 1 tablet (150 mg total) by mouth at bedtime. 02/17/24   [name], NP  triamcinolone  (KENALOG ) 0.025 % ointment Apply 1 Application topically 2 (two) times daily. 04/14/24   [name], NP  valsartan -hydrochlorothiazide  (DIOVAN -HCT) 80-12.5 MG tablet Take 1 tablet by mouth daily. 12/23/23   Nichols, Tonya S, NP  amitriptyline (ELAVIL) 50 MG tablet amitriptyline 50 mg tablet  Take 1 tablet(s) every day by oral route as directed for 30 days.  11/10/20  [provider]  gabapentin (NEURONTIN) 300 MG capsule Take 300 mg by mouth.  11/10/20  [provider]  metFORMIN  (GLUCOPHAGE ) 1000 MG tablet Take 1 tablet (1,000 mg total) by mouth 2 (two) times daily with a meal. 09/22/23   [name], NP    Allergies: Penicillins    Review of Systems  Constitutional:  Negative for chills and fever.  HENT:  Negative for ear pain and sore throat.   Eyes:  Negative for pain and visual disturbance.  Respiratory:  Negative for cough and shortness of breath.   Cardiovascular:  Negative for chest pain and palpitations.  Gastrointestinal:  Negative for abdominal pain and vomiting.  Genitourinary:  Negative for dysuria and hematuria.  Musculoskeletal:  Negative for arthralgias and back pain.       Admit left fifth toe pain  Skin:  Negative for color change and rash.  Neurological:  Negative for seizures and syncope.  All other systems reviewed and are negative.   Updated Vital Signs BP 133/76 (BP Location: Right Arm)   Pulse (!) 105   Temp (!) 97.5 F (36.4 C) (Oral)   Resp 12   LMP  (LMP Unknown)   SpO2 96%   Physical Exam Vitals and nursing note reviewed.  Constitutional:      General: She is not in acute distress.    Appearance: Normal appearance. She is well-developed. She is not ill-appearing.  HENT:     Head: Normocephalic and atraumatic.  Eyes:     Conjunctiva/sclera: Conjunctivae normal.  Cardiovascular:     Rate and Rhythm: Normal  rate and regular rhythm.     Heart sounds: No murmur heard. Pulmonary:     Effort: Pulmonary effort is normal. No respiratory distress.     Breath sounds: Normal breath sounds.  Abdominal:     Palpations: Abdomen is soft.     Tenderness: There is no abdominal tenderness.  Musculoskeletal:        General: No swelling.     Cervical back: Neck supple.  Skin:    General: Skin is warm and dry.     Capillary Refill: Capillary refill takes less than 2 seconds.     Comments: Left fifth toe with callus and  small ulcer and surrounding erythema up the foot consistent with cellulitis  Neurological:     Mental Status: She is alert.  Psychiatric:        Mood and Affect: Mood normal.     (all labs ordered are listed, but only abnormal results are displayed) Labs Reviewed - No data to display  EKG: None  Radiology: No results found.   Procedures   Medications Ordered in the ED - No data to display                                  Medical Decision Making Patient here for toe pain since April.  States before she had previous cellulitis was treated with antibiotics it went away.  She thinks it is back again.  On exam it does appear that she has a simple cellulitis of her foot, she does have a chronic appearing ulcer.  She is diabetic.  Has not followed up with podiatry or primary care as she has had difficulties with insurance.  States she should be getting insurance this week and will plan to follow-up.  Advised her to do these things I will start her on Keflex  and doxycycline  as this has helped in the past.  Advise return to the ER for new or worsening symptoms.  Advised Tylenol  and Motrin  as needed for pain.  She was comfortable with the plan to be discharged home.  Problems Addressed: Cellulitis of foot: acute illness or injury Skin ulcer of toe of left foot, limited to breakdown of skin [facility]): chronic illness or injury  Amount and/or Complexity of Data Reviewed External Data  Reviewed: notes.    Details: Prior ED records reviewed and patient was seen previously for cellulitis and treated with Keflex  and doxycycline   Risk OTC drugs. Prescription drug management. Diagnosis or treatment significantly limited by social determinants of health.   Final diagnoses:  Skin ulcer of toe of left foot, limited to breakdown of skin (HCC)  Cellulitis of foot    ED Discharge Orders          Ordered    cephALEXin  (KEFLEX ) 500 MG capsule  4 times daily        06/24/24 0817    doxycycline  (VIBRAMYCIN ) 100 MG capsule  2 times daily        06/24/24 0817               Gennaro Duwaine CROME, DO 06/24/24 [phone]

## 2024-06-24 NOTE — ED Triage Notes (Signed)
 She c/o on-going (since ~ April of this year) left 5th toe issue. She has a chronic sore with some erythema at ant. Distal lower leg. Pt. Denies fever and is in no distress.

## 2024-06-24 NOTE — Discharge Instructions (Addendum)
 Take your antibiotics as prescribed.  You can use Tylenol  and Motrin  as needed for pain.  Follow-up with your primary care doctor and podiatry at your earliest convenience.  Return to the ER for new or worsening symptoms.

## 2024-06-25 ENCOUNTER — Inpatient Hospital Stay (HOSPITAL_BASED_OUTPATIENT_CLINIC_OR_DEPARTMENT_OTHER)
Admission: EM | Admit: 2024-06-25 | Discharge: 2024-06-29 | DRG: 629 | Disposition: A | Discharge status: Home / Self Care | Payer: Self-pay | Attending: Internal Medicine | Admitting: Internal Medicine

## 2024-06-25 ENCOUNTER — Emergency Department (HOSPITAL_BASED_OUTPATIENT_CLINIC_OR_DEPARTMENT_OTHER)

## 2024-06-25 ENCOUNTER — Other Ambulatory Visit: Payer: Self-pay

## 2024-06-25 ENCOUNTER — Encounter (HOSPITAL_BASED_OUTPATIENT_CLINIC_OR_DEPARTMENT_OTHER): Payer: Self-pay | Admitting: Emergency Medicine

## 2024-06-25 DIAGNOSIS — F32A Depression, unspecified: Secondary | ICD-10-CM | POA: Diagnosis present

## 2024-06-25 DIAGNOSIS — E11628 Type 2 diabetes mellitus with other skin complications: Secondary | ICD-10-CM | POA: Diagnosis not present

## 2024-06-25 DIAGNOSIS — R6510 Systemic inflammatory response syndrome (SIRS) of non-infectious origin without acute organ dysfunction: Secondary | ICD-10-CM | POA: Diagnosis present

## 2024-06-25 DIAGNOSIS — I10 Essential (primary) hypertension: Secondary | ICD-10-CM | POA: Diagnosis present

## 2024-06-25 DIAGNOSIS — F5104 Psychophysiologic insomnia: Secondary | ICD-10-CM | POA: Diagnosis present

## 2024-06-25 DIAGNOSIS — E1169 Type 2 diabetes mellitus with other specified complication: Secondary | ICD-10-CM | POA: Diagnosis present

## 2024-06-25 DIAGNOSIS — E1165 Type 2 diabetes mellitus with hyperglycemia: Secondary | ICD-10-CM | POA: Diagnosis present

## 2024-06-25 DIAGNOSIS — Z79899 Other long term (current) drug therapy: Secondary | ICD-10-CM

## 2024-06-25 DIAGNOSIS — Z9049 Acquired absence of other specified parts of digestive tract: Secondary | ICD-10-CM

## 2024-06-25 DIAGNOSIS — Z794 Long term (current) use of insulin: Secondary | ICD-10-CM

## 2024-06-25 DIAGNOSIS — E7800 Pure hypercholesterolemia, unspecified: Secondary | ICD-10-CM | POA: Diagnosis present

## 2024-06-25 DIAGNOSIS — S90122A Contusion of left lesser toe(s) without damage to nail, initial encounter: Secondary | ICD-10-CM

## 2024-06-25 DIAGNOSIS — Z7984 Long term (current) use of oral hypoglycemic drugs: Secondary | ICD-10-CM

## 2024-06-25 DIAGNOSIS — E11621 Type 2 diabetes mellitus with foot ulcer: Secondary | ICD-10-CM | POA: Diagnosis present

## 2024-06-25 DIAGNOSIS — Z792 Long term (current) use of antibiotics: Secondary | ICD-10-CM

## 2024-06-25 DIAGNOSIS — Z8249 Family history of ischemic heart disease and other diseases of the circulatory system: Secondary | ICD-10-CM

## 2024-06-25 DIAGNOSIS — D638 Anemia in other chronic diseases classified elsewhere: Secondary | ICD-10-CM | POA: Diagnosis present

## 2024-06-25 DIAGNOSIS — Z7982 Long term (current) use of aspirin: Secondary | ICD-10-CM

## 2024-06-25 DIAGNOSIS — Z880 Allergy status to penicillin: Secondary | ICD-10-CM

## 2024-06-25 DIAGNOSIS — L089 Local infection of the skin and subcutaneous tissue, unspecified: Secondary | ICD-10-CM

## 2024-06-25 DIAGNOSIS — Z833 Family history of diabetes mellitus: Secondary | ICD-10-CM

## 2024-06-25 DIAGNOSIS — E876 Hypokalemia: Secondary | ICD-10-CM | POA: Diagnosis not present

## 2024-06-25 DIAGNOSIS — Z6835 Body mass index (BMI) 35.0-35.9, adult: Secondary | ICD-10-CM

## 2024-06-25 DIAGNOSIS — M869 Osteomyelitis, unspecified: Secondary | ICD-10-CM | POA: Diagnosis present

## 2024-06-25 DIAGNOSIS — K589 Irritable bowel syndrome without diarrhea: Secondary | ICD-10-CM | POA: Diagnosis present

## 2024-06-25 DIAGNOSIS — E66812 Obesity, class 2: Secondary | ICD-10-CM | POA: Diagnosis present

## 2024-06-25 DIAGNOSIS — L02612 Cutaneous abscess of left foot: Secondary | ICD-10-CM | POA: Diagnosis present

## 2024-06-25 DIAGNOSIS — E1140 Type 2 diabetes mellitus with diabetic neuropathy, unspecified: Secondary | ICD-10-CM | POA: Diagnosis present

## 2024-06-25 DIAGNOSIS — L03116 Cellulitis of left lower limb: Principal | ICD-10-CM | POA: Diagnosis present

## 2024-06-25 DIAGNOSIS — L97529 Non-pressure chronic ulcer of other part of left foot with unspecified severity: Secondary | ICD-10-CM | POA: Diagnosis present

## 2024-06-25 LAB — CBC WITH DIFFERENTIAL/PLATELET
Abs Immature Granulocytes: 0.04 K/uL (ref 0.00–0.07)
Basophils Absolute: 0 K/uL (ref 0.0–0.1)
Basophils Relative: 0 %
Eosinophils Absolute: 0.1 K/uL (ref 0.0–0.5)
Eosinophils Relative: 1 %
HCT: 34.5 % — ABNORMAL LOW (ref 36.0–46.0)
Hemoglobin: 11.4 g/dL — ABNORMAL LOW (ref 12.0–15.0)
Immature Granulocytes: 0 %
Lymphocytes Relative: 18 %
Lymphs Abs: 1.7 K/uL (ref 0.7–4.0)
MCH: 28.2 pg (ref 26.0–34.0)
MCHC: 33 g/dL (ref 30.0–36.0)
MCV: 85.4 fL (ref 80.0–100.0)
Monocytes Absolute: 0.7 K/uL (ref 0.1–1.0)
Monocytes Relative: 7 %
Neutro Abs: 6.9 K/uL (ref 1.7–7.7)
Neutrophils Relative %: 74 %
Platelets: 304 K/uL (ref 150–400)
RBC: 4.04 MIL/uL (ref 3.87–5.11)
RDW: 11.8 % (ref 11.5–15.5)
WBC: 9.6 K/uL (ref 4.0–10.5)
nRBC: 0 % (ref 0.0–0.2)

## 2024-06-25 LAB — BASIC METABOLIC PANEL WITH GFR
Anion gap: 14 (ref 5–15)
BUN: 6 mg/dL (ref 6–20)
CO2: 25 mmol/L (ref 22–32)
Calcium: 9.8 mg/dL (ref 8.9–10.3)
Chloride: 100 mmol/L (ref 98–111)
Creatinine, Ser: 0.74 mg/dL (ref 0.44–1.00)
GFR, Estimated: >60 mL/min (ref >60)
Glucose, Bld: 206 mg/dL — ABNORMAL HIGH (ref 70–99)
Potassium: 3.1 mmol/L — ABNORMAL LOW (ref 3.5–5.1)
Sodium: 139 mmol/L (ref 135–145)

## 2024-06-25 LAB — LACTIC ACID, PLASMA: Lactic Acid, Venous: 2.6 mmol/L (ref 0.5–1.9)

## 2024-06-25 MED ORDER — POTASSIUM CHLORIDE CRYS ER 20 MEQ PO TBCR
40.0000 meq | EXTENDED_RELEASE_TABLET | Freq: Once | ORAL | Status: AC
  Administered 2024-06-25: 40 meq via ORAL
  Filled 2024-06-25: qty 2

## 2024-06-25 MED ORDER — SODIUM CHLORIDE 0.9 % IV BOLUS
1000.0000 mL | Freq: Once | INTRAVENOUS | Status: AC
  Administered 2024-06-25: 1000 mL via INTRAVENOUS

## 2024-06-25 MED ORDER — SODIUM CHLORIDE 0.9 % IV SOLN
2.0000 g | Freq: Once | INTRAVENOUS | Status: AC
  Administered 2024-06-25: 2 g via INTRAVENOUS
  Filled 2024-06-25: qty 12.5

## 2024-06-25 MED ORDER — HYDROCODONE-ACETAMINOPHEN 5-325 MG PO TABS
1.0000 | ORAL_TABLET | Freq: Once | ORAL | Status: AC
  Administered 2024-06-25: 1 via ORAL
  Filled 2024-06-25: qty 1

## 2024-06-25 MED ORDER — VANCOMYCIN HCL IN DEXTROSE 1-5 GM/200ML-% IV SOLN
1000.0000 mg | Freq: Once | INTRAVENOUS | Status: AC
  Administered 2024-06-25: 1000 mg via INTRAVENOUS
  Filled 2024-06-25: qty 200

## 2024-06-25 NOTE — ED Provider Notes (Signed)
 [place] EMERGENCY DEPARTMENT AT [facility] Provider Note   CSN: [id number] Arrival date & time: 06/25/24  1843     Patient presents with: Toe Pain   Erica [name] is a 53 y.o. female here for left fifth toe wound.  States she has had ongoing issues to this area.  Was seen at the end of May did a round of antibiotics which seemed to help however reoccurred.  He tried to get into see her PCP and podiatry however was having insurance issues and was not able to.  She was seen in the ED yesterday and prescribed antibiotics for possible cellulitis.  She was not able to pick these up.  She woke up this morning and had worsening pain and streaking going up the anterior aspect of her tib-fib.  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  No fever, nausea, vomiting.  She has chronic lower extremity paresthesias from her diabetic neuropathy.  No recent falls or injuries.   HPI     Prior to Admission medications   Medication Sig Start Date End Date Taking? Authorizing Provider  aspirin  81 MG tablet Take 81 mg by mouth at bedtime.     [provider]  atorvastatin  (LIPITOR) 40 MG tablet Take 2 tablets (80 mg total) by mouth daily. 03/29/24   [name], NP  Blood Glucose Monitoring Suppl (TRUE METRIX METER) w/Device KIT Use to check blood sugar up to twice daily. 08/03/23   [name], NP  buPROPion  (WELLBUTRIN  XL) 150 MG 24 hr tablet Take 1 tablet (150 mg total) by mouth every morning. 02/14/24   [name], NP  cephALEXin  (KEFLEX ) 500 MG capsule Take 1 capsule (500 mg total) by mouth 4 (four) times daily for 7 days. 06/24/24 07/01/24  Kammerer, Megan L, DO  doxycycline  (VIBRAMYCIN ) 100 MG capsule Take 1 capsule (100 mg total) by mouth 2 (two) times daily for 7 days. 06/24/24 07/01/24  Kammerer, Megan L, DO  ergocalciferol  (VITAMIN D2) 1.25 MG (50000 UT) capsule Take 1 capsule by mouth once a week. 02/23/24   [name], NP  ergocalciferol   (VITAMIN D2) 1.25 MG (50000 UT) capsule Take 1 capsule by mouth once a week. 04/14/24   [name], NP  escitalopram  (LEXAPRO ) 20 MG tablet Take 1 tablet (20 mg total) by mouth daily. 09/22/23   [name], NP  Fluocinolone  Acetonide Body 0.01 % OIL Use as needed to scalp 12/23/23   Nichols, Tonya S, NP  Glucose Blood (BLOOD GLUCOSE TEST STRIPS) STRP Use to check blood sugar up to twice daily. 08/03/23   [name], NP  Lancet Device MISC Use to check blood sugar up to twice daily. May substitute to any manufacturer covered by patient's insurance. Patient not taking: Reported on 05/09/2024 08/03/23   [name], NP  meclizine  (ANTIVERT ) 25 MG tablet Take 1 tablet (25 mg total) by mouth 3 (three) times daily as needed for dizziness. 11/22/23   Ula Prentice SAUNDERS, MD  medroxyPROGESTERone  (PROVERA ) 10 MG tablet Take 1 tablet (10 mg total) by mouth daily. Patient not taking: Reported on 03/21/2024 11/15/23   Eveline Lynwood MATSU, MD  medroxyPROGESTERone  Acetate 150 MG/ML SUSY Inject 1 mL (150 mg total) into the muscle every 3 (three) months. Patient not taking: Reported on 03/21/2024 12/02/23   Zina Jerilynn LABOR, MD  metFORMIN  (GLUCOPHAGE -XR) 500 MG 24 hr tablet Take 2 tablets (1,000 mg total) by mouth 2 (two)  times daily with a meal. 06/08/24   [name], NP  omeprazole  (PRILOSEC) 20 MG capsule Take 1 capsule (20 mg total) by mouth daily. Patient not taking: Reported on 03/21/2024 02/05/24   Horton, Charmaine FALCON, MD  ondansetron  (ZOFRAN ) 4 MG tablet Take 1 tablet (4 mg total) by mouth every 8 (eight) hours as needed for nausea or vomiting. Patient not taking: Reported on 02/17/2024 09/22/23   [name], NP  oxyCODONE -acetaminophen  (PERCOCET) 7.5-325 MG tablet Take 1 tablet by mouth every 6 (six) hours as needed for severe pain (pain score 7-10). Patient not taking: Reported on 02/17/2024 11/22/23   Ula Prentice SAUNDERS, MD  pregabalin  (LYRICA ) 150 MG capsule Take 1 capsule (150 mg total) by mouth 2 (two)  times daily. 10/15/23   [name], NP  Semaglutide , 2 MG/DOSE, (OZEMPIC , 2 MG/DOSE,) 8 MG/3ML SOPN Inject 2 mg as directed once a week. 06/21/24   [name], NP  traZODone  (DESYREL ) 150 MG tablet Take 1 tablet (150 mg total) by mouth at bedtime. 02/17/24   Nichols, Tonya S, NP  triamcinolone  (KENALOG ) 0.025 % ointment Apply 1 Application topically 2 (two) times daily. 04/14/24   [name], NP  valsartan -hydrochlorothiazide  (DIOVAN -HCT) 80-12.5 MG tablet Take 1 tablet by mouth daily. 12/23/23   Nichols, Tonya S, NP  amitriptyline (ELAVIL) 50 MG tablet amitriptyline 50 mg tablet  Take 1 tablet(s) every day by oral route as directed for 30 days.  11/10/20  [provider]  gabapentin (NEURONTIN) 300 MG capsule Take 300 mg by mouth.  11/10/20  [provider]  metFORMIN  (GLUCOPHAGE ) 1000 MG tablet Take 1 tablet (1,000 mg total) by mouth 2 (two) times daily with a meal. 09/22/23   [name], NP    Allergies: Penicillins    Review of Systems  Constitutional: Negative.   HENT: Negative.    Respiratory: Negative.    Cardiovascular: Negative.   Gastrointestinal: Negative.   Genitourinary: Negative.   Musculoskeletal:        Left foot pain  Skin:  Positive for wound.  Neurological: Negative.   All other systems reviewed and are negative.   Updated Vital Signs BP (!) 149/80 (BP Location: Right Arm)   Pulse (!) 103   Temp 98.1 F (36.7 C) (Oral)   Resp 18   Ht 6' (1.829 m)   Wt 117.9 kg   LMP  (LMP Unknown)   SpO2 99%   BMI 35.25 kg/m   Physical Exam Vitals and nursing note reviewed.  Constitutional:      General: She is not in acute distress.    Appearance: She is well-developed. She is not ill-appearing, toxic-appearing or diaphoretic.  HENT:     Head: Atraumatic.  Eyes:     Pupils: Pupils are equal, round, and reactive to light.  Cardiovascular:     Rate and Rhythm: Normal rate.     Pulses:          Dorsalis pedis pulses are 2+ on the  right side and 2+ on the left side.  Pulmonary:     Effort: No respiratory distress.  Abdominal:     General: There is no distension.  Musculoskeletal:        General: Normal range of motion.     Cervical back: Normal range of motion.     Comments: Diffuse tenderness about anterior aspect left tib-fib into dorsum of foot to left lateral dorsum great toe.  Macerated tissue between 4th and 5th metatarsal.  Appears to have 1.5 cm callus with pus filled lesion to the fifth toe.  Calf soft, nontender.  Skin:    General: Skin is warm and dry.     Capillary Refill: Capillary refill takes less than 2 seconds.     Comments: See picture in chart  Neurological:     General: No focal deficit present.     Mental Status: She is alert.  Psychiatric:        Mood and Affect: Mood normal.        (all labs ordered are listed, but only abnormal results are displayed) Labs Reviewed  CBC WITH DIFFERENTIAL/PLATELET - Abnormal; Notable for the following components:      Result Value   Hemoglobin 11.4 (*)    HCT 34.5 (*)    All other components within normal limits  BASIC METABOLIC PANEL WITH GFR - Abnormal; Notable for the following components:   Potassium 3.1 (*)    Glucose, Bld 206 (*)    All other components within normal limits  LACTIC ACID, PLASMA - Abnormal; Notable for the following components:   Lactic Acid, Venous 2.6 (*)    All other components within normal limits  CULTURE, BLOOD (ROUTINE X 2)  CULTURE, BLOOD (ROUTINE X 2)  LACTIC ACID, PLASMA    EKG: None  Radiology: DG Foot Complete Left Result Date: 06/25/2024 CLINICAL DATA:  Fifth toe wound and cellulitis, initial encounter EXAM: LEFT FOOT - COMPLETE 3+ VIEW COMPARISON:  None Available. FINDINGS: Soft tissue swelling is noted in the fifth digit consistent with the physical exam. No acute fracture or dislocation is noted. No erosive changes to suggest osteomyelitis are noted. Calcaneal spurring is seen. IMPRESSION: No erosive  changes to suggest osteomyelitis. Soft tissue swelling in the fifth digit. Electronically Signed   By: Oneil Devonshire M.D.   On: 06/25/2024 20:12     .Critical Care  Performed by: Edie Rosebud LABOR, PA-C Authorized by: Edie Rosebud LABOR, PA-C   Critical care provider statement:    Critical care time (minutes):  31   Critical care was necessary to treat or prevent imminent or life-threatening deterioration of the following conditions:  Sepsis   Critical care was time spent personally by me on the following activities:  Development of treatment plan with patient or surrogate, discussions with consultants, evaluation of patient's response to treatment, examination of patient, ordering and review of laboratory studies, ordering and review of radiographic studies, ordering and performing treatments and interventions, pulse oximetry, re-evaluation of patient's condition and review of old charts    Medications Ordered in the ED  vancomycin  (VANCOCIN ) IVPB 1000 mg/200 mL premix (has no administration in time range)  potassium chloride  SA (KLOR-CON  M) CR tablet 40 mEq (has no administration in time range)  sodium chloride  0.9 % bolus 1,000 mL (has no administration in time range)  ceFEPIme  (MAXIPIME ) 2 g in sodium chloride  0.9 % 100 mL IVPB (0 g Intravenous Stopped 06/25/24 2135)  HYDROcodone -acetaminophen  (NORCO/VICODIN) 5-325 MG per tablet 1 tablet (1 tablet Oral Given 06/25/24 2020)  sodium chloride  0.9 % bolus 1,000 mL (1,000 mLs Intravenous New Bag/Given 06/25/24 367)    53 year old here for evaluation of left little toe wound.  She has multiple medical problems including diabetes with diabetic neuropathy.  Has had ongoing issue to her left fifth digit.  Seen at the end of May prescribed antibiotics.  Area initially improved however subsequently worsened.  Has not been able to follow back up with PCP or podiatry due  to insurance issues.  Seen yesterday was prescribed antibiotics for cellulitis  however was not able to pick them up.  She was up today with worsening pain and streaking going up from the little toe to the dorsum of the foot and into her anterior tib-fib.  Here she appears to have extensive cellulitis.  She has no crepitus to suggest necrotizing infection.  Concern for osteomyelitis given her known history of diabetes.  Will plan on labs and imaging.  Patient initially unsure about admission.  She states she will think about it.  In the meantime we will get labs, IV antibiotics and reassess  Labs and imaging personally viewed and interpreted:  X-ray foot with soft tissue swelling, no erosions to suggest osteomyelitis CBC without leukocytosis, hemoglobin 11.4 BMP potassium 3.1--given oral placement Lactic 2.6 BC obtained  Patient reassessed.  Discussed labs and imaging.  Given rapidly progressive cellulitic changes will plan for admission for IV antibiotics also meet sepsis criteria with tachycardia, leukocytosis and source of infection.  Getting 2 L IV fluid.  No history of heart failure.  Low suspicion for necrotizing infection, septic joint, gout, hemarthrosis, occult fracture, dislocation, VTE, ischemia.  Discussed with medicine team who is agreeable to accept patient in transfer for admission  The patient appears reasonably stabilized for admission considering the current resources, flow, and capabilities available in the ED at this time, and I doubt any other [facility] requiring further screening and/or treatment in the ED prior to admission.    Clinical Course as of 06/25/24 2143  [place] Jun 25, 2024  2139 Discussed with Dr. Marcene who is agreeable to accept patient in transfer for admission [BH]    Clinical Course User Index [BH] Dejai Schubach A, PA-C                                 Medical Decision Making Amount and/or Complexity of Data Reviewed External Data Reviewed: labs, radiology and notes. Labs: ordered. Decision-making details documented in ED  Course. Radiology: ordered and independent interpretation performed. Decision-making details documented in ED Course.  Risk OTC drugs. Prescription drug management. Parenteral controlled substances. Decision regarding hospitalization. Diagnosis or treatment significantly limited by social determinants of health.       Final diagnoses:  Cellulitis of left lower extremity  SIRS (systemic inflammatory response syndrome) (HCC)  Hematoma of left little toe  Hypokalemia    ED Discharge Orders     None          Aseret Hoffman A, PA-C 06/25/24 2144    Simon Lavonia SAILOR, MD 06/25/24 2228

## 2024-06-25 NOTE — ED Notes (Signed)
 Antibiotics were delayed do to Pt needing US  guided IV and having difficult veins to access.

## 2024-06-25 NOTE — Progress Notes (Signed)
 Hospitalist Transfer Note:    Nursing staff, Please call [facility] Admits & Consults System-Wide number on Amion [phone]) as soon as patient's arrival, so appropriate admitting provider can evaluate the pt.  Transferring facility: [facility] Requesting provider: Arthor Captain, PA (EDP at [facility]) Reason for transfer: admission for further evaluation and management of acute diverticulitis in the setting of failed outpatient antibiotics.     53 year old female  who presented to [facility] ED complaining of 3 weeks of progressive lower abdominal discomfort.  Approximately 1 week ago he had presented to his PCP with complaints of lower abdominal discomfort, at which time PCP was able to arrange for outpatient CT abdomen/pelvis which was suggestive of acute diverticulitis.  The patient subsequently completed a course of Cipro/Flagyl, but is noted further ensuing progression of his lower abdominal discomfort, prompting him to present to [facility] this evening.  Vital signs in the ED were notable for the following: Afebrile, heart rates in the 60s to 80s; systolic blood pressures in the 110s to 130s mmHg.   Imaging in the ED today notable for CT abdomen/pelvis with contrast, which showed acute diverticulitis without evidence of obstruction, perforation, or abscess.  Medications administered prior to transfer included the following: Zosyn   Subsequently, I accepted this patient for transfer for inpatient admission to a med/tele bed at [facility] or [facility]  (first available) for further work-up and management of the above.        Newton Pigg, DO Hospitalist

## 2024-06-25 NOTE — ED Triage Notes (Signed)
 Pt returns for continued pain in her left 5th toe. She was seen yesterday for the same and given prescriptions for antibiotics. She states she has been taking aleve  for pain, and it is worse today; foot is more swollen than yesterday. Pt a&o x 4; nad noted.

## 2024-06-25 NOTE — ED Notes (Signed)
 Called [facility] for transport spoke with [employer]

## 2024-06-26 ENCOUNTER — Inpatient Hospital Stay (HOSPITAL_COMMUNITY)

## 2024-06-26 ENCOUNTER — Encounter (HOSPITAL_COMMUNITY): Payer: Self-pay | Admitting: Family Medicine

## 2024-06-26 ENCOUNTER — Other Ambulatory Visit: Payer: Self-pay

## 2024-06-26 DIAGNOSIS — Z794 Long term (current) use of insulin: Secondary | ICD-10-CM | POA: Diagnosis not present

## 2024-06-26 DIAGNOSIS — I10 Essential (primary) hypertension: Secondary | ICD-10-CM | POA: Diagnosis present

## 2024-06-26 DIAGNOSIS — D638 Anemia in other chronic diseases classified elsewhere: Secondary | ICD-10-CM | POA: Diagnosis present

## 2024-06-26 DIAGNOSIS — E11628 Type 2 diabetes mellitus with other skin complications: Secondary | ICD-10-CM | POA: Diagnosis present

## 2024-06-26 DIAGNOSIS — F32A Depression, unspecified: Secondary | ICD-10-CM | POA: Diagnosis present

## 2024-06-26 DIAGNOSIS — S90122A Contusion of left lesser toe(s) without damage to nail, initial encounter: Secondary | ICD-10-CM

## 2024-06-26 DIAGNOSIS — Z7982 Long term (current) use of aspirin: Secondary | ICD-10-CM | POA: Diagnosis not present

## 2024-06-26 DIAGNOSIS — Z7984 Long term (current) use of oral hypoglycemic drugs: Secondary | ICD-10-CM | POA: Diagnosis not present

## 2024-06-26 DIAGNOSIS — E1165 Type 2 diabetes mellitus with hyperglycemia: Secondary | ICD-10-CM | POA: Diagnosis present

## 2024-06-26 DIAGNOSIS — Z8249 Family history of ischemic heart disease and other diseases of the circulatory system: Secondary | ICD-10-CM | POA: Diagnosis not present

## 2024-06-26 DIAGNOSIS — E11621 Type 2 diabetes mellitus with foot ulcer: Secondary | ICD-10-CM | POA: Diagnosis present

## 2024-06-26 DIAGNOSIS — L02612 Cutaneous abscess of left foot: Secondary | ICD-10-CM | POA: Diagnosis present

## 2024-06-26 DIAGNOSIS — E876 Hypokalemia: Secondary | ICD-10-CM | POA: Diagnosis present

## 2024-06-26 DIAGNOSIS — L98499 Non-pressure chronic ulcer of skin of other sites with unspecified severity: Secondary | ICD-10-CM

## 2024-06-26 DIAGNOSIS — L97529 Non-pressure chronic ulcer of other part of left foot with unspecified severity: Secondary | ICD-10-CM | POA: Diagnosis present

## 2024-06-26 DIAGNOSIS — S90425A Blister (nonthermal), left lesser toe(s), initial encounter: Secondary | ICD-10-CM

## 2024-06-26 DIAGNOSIS — M869 Osteomyelitis, unspecified: Secondary | ICD-10-CM | POA: Diagnosis present

## 2024-06-26 DIAGNOSIS — E1140 Type 2 diabetes mellitus with diabetic neuropathy, unspecified: Secondary | ICD-10-CM | POA: Diagnosis present

## 2024-06-26 DIAGNOSIS — E1169 Type 2 diabetes mellitus with other specified complication: Secondary | ICD-10-CM | POA: Diagnosis present

## 2024-06-26 DIAGNOSIS — E7800 Pure hypercholesterolemia, unspecified: Secondary | ICD-10-CM | POA: Diagnosis present

## 2024-06-26 DIAGNOSIS — K589 Irritable bowel syndrome without diarrhea: Secondary | ICD-10-CM | POA: Diagnosis present

## 2024-06-26 DIAGNOSIS — Z6835 Body mass index (BMI) 35.0-35.9, adult: Secondary | ICD-10-CM | POA: Diagnosis not present

## 2024-06-26 DIAGNOSIS — L089 Local infection of the skin and subcutaneous tissue, unspecified: Secondary | ICD-10-CM

## 2024-06-26 DIAGNOSIS — L03116 Cellulitis of left lower limb: Secondary | ICD-10-CM | POA: Diagnosis present

## 2024-06-26 DIAGNOSIS — Z833 Family history of diabetes mellitus: Secondary | ICD-10-CM | POA: Diagnosis not present

## 2024-06-26 DIAGNOSIS — E66812 Obesity, class 2: Secondary | ICD-10-CM | POA: Diagnosis present

## 2024-06-26 DIAGNOSIS — F5104 Psychophysiologic insomnia: Secondary | ICD-10-CM | POA: Diagnosis present

## 2024-06-26 DIAGNOSIS — R6510 Systemic inflammatory response syndrome (SIRS) of non-infectious origin without acute organ dysfunction: Secondary | ICD-10-CM | POA: Diagnosis present

## 2024-06-26 LAB — CBC
HCT: 33.8 % — ABNORMAL LOW (ref 36.0–46.0)
Hemoglobin: 10.9 g/dL — ABNORMAL LOW (ref 12.0–15.0)
MCH: 27.9 pg (ref 26.0–34.0)
MCHC: 32.2 g/dL (ref 30.0–36.0)
MCV: 86.7 fL (ref 80.0–100.0)
Platelets: 287 K/uL (ref 150–400)
RBC: 3.9 MIL/uL (ref 3.87–5.11)
RDW: 11.9 % (ref 11.5–15.5)
WBC: 8.6 K/uL (ref 4.0–10.5)
nRBC: 0 % (ref 0.0–0.2)

## 2024-06-26 LAB — GLUCOSE, CAPILLARY
Glucose-Capillary: 333 mg/dL — ABNORMAL HIGH (ref 70–99)
Glucose-Capillary: 265 mg/dL — ABNORMAL HIGH (ref 70–99)
Glucose-Capillary: 219 mg/dL — ABNORMAL HIGH (ref 70–99)
Glucose-Capillary: 276 mg/dL — ABNORMAL HIGH (ref 70–99)

## 2024-06-26 LAB — BASIC METABOLIC PANEL WITH GFR
Anion gap: 12 (ref 5–15)
BUN: 6 mg/dL (ref 6–20)
CO2: 22 mmol/L (ref 22–32)
Calcium: 8.6 mg/dL — ABNORMAL LOW (ref 8.9–10.3)
Chloride: 103 mmol/L (ref 98–111)
Creatinine, Ser: 0.57 mg/dL (ref 0.44–1.00)
GFR, Estimated: >60 mL/min (ref >60)
Glucose, Bld: 272 mg/dL — ABNORMAL HIGH (ref 70–99)
Potassium: 3.3 mmol/L — ABNORMAL LOW (ref 3.5–5.1)
Sodium: 137 mmol/L (ref 135–145)

## 2024-06-26 LAB — MAGNESIUM: Magnesium: 1.5 mg/dL — ABNORMAL LOW (ref 1.7–2.4)

## 2024-06-26 LAB — SEDIMENTATION RATE: Sed Rate: 59 mm/h — ABNORMAL HIGH (ref 0–22)

## 2024-06-26 LAB — C-REACTIVE PROTEIN: CRP: 6.5 mg/dL — ABNORMAL HIGH (ref <1.0)

## 2024-06-26 LAB — LACTIC ACID, PLASMA: Lactic Acid, Venous: 2 mmol/L (ref 0.5–1.9)

## 2024-06-26 LAB — HIV ANTIBODY (ROUTINE TESTING W REFLEX): HIV Screen 4th Generation wRfx: NONREACTIVE

## 2024-06-26 LAB — VAS US ABI WITH/WO TBI
Left ABI: 1.12
Right ABI: 1.11

## 2024-06-26 LAB — PREALBUMIN: Prealbumin: 14 mg/dL — ABNORMAL LOW (ref 18–38)

## 2024-06-26 MED ORDER — VITAMIN C 500 MG PO TABS
500.0000 mg | ORAL_TABLET | Freq: Two times a day (BID) | ORAL | Status: DC
  Administered 2024-06-26 – 2024-06-29 (×7): 500 mg via ORAL
  Filled 2024-06-26 (×7): qty 1

## 2024-06-26 MED ORDER — HYDROMORPHONE HCL 1 MG/ML IJ SOLN: 0.5000 mg | INTRAMUSCULAR | Status: DC | PRN

## 2024-06-26 MED ORDER — MAGNESIUM SULFATE 2 GM/50ML IV SOLN
2.0000 g | Freq: Once | INTRAVENOUS | Status: AC
  Administered 2024-06-26: 2 g via INTRAVENOUS
  Filled 2024-06-26: qty 50

## 2024-06-26 MED ORDER — OXYCODONE HCL 5 MG PO TABS
5.0000 mg | ORAL_TABLET | ORAL | Status: DC | PRN
  Administered 2024-06-27 (×2): 5 mg via ORAL
  Filled 2024-06-26 (×2): qty 1

## 2024-06-26 MED ORDER — ENOXAPARIN SODIUM 40 MG/0.4ML IJ SOSY
40.0000 mg | PREFILLED_SYRINGE | INTRAMUSCULAR | Status: DC
  Administered 2024-06-26 – 2024-06-29 (×4): 40 mg via SUBCUTANEOUS
  Filled 2024-06-26 (×4): qty 0.4

## 2024-06-26 MED ORDER — ZINC SULFATE 220 (50 ZN) MG PO CAPS
220.0000 mg | ORAL_CAPSULE | Freq: Every day | ORAL | Status: DC
  Administered 2024-06-26 – 2024-06-29 (×4): 220 mg via ORAL
  Filled 2024-06-26 (×4): qty 1

## 2024-06-26 MED ORDER — POLYETHYLENE GLYCOL 3350 17 G PO PACK
17.0000 g | PACK | Freq: Every day | ORAL | Status: DC | PRN
  Filled 2024-06-26: qty 1

## 2024-06-26 MED ORDER — TRANEXAMIC ACID-NACL 1000-0.7 MG/100ML-% IV SOLN
1000.0000 mg | INTRAVENOUS | Status: AC
  Administered 2024-06-27: 1000 mg via INTRAVENOUS
  Filled 2024-06-26: qty 100

## 2024-06-26 MED ORDER — SODIUM CHLORIDE 0.9 % IV SOLN
2.0000 g | Freq: Three times a day (TID) | INTRAVENOUS | Status: DC
  Administered 2024-06-26 – 2024-06-29 (×10): 2 g via INTRAVENOUS
  Filled 2024-06-26 (×12): qty 12.5

## 2024-06-26 MED ORDER — SODIUM CHLORIDE 0.9% FLUSH
3.0000 mL | Freq: Two times a day (BID) | INTRAVENOUS | Status: DC
  Administered 2024-06-26 – 2024-06-29 (×5): 3 mL via INTRAVENOUS

## 2024-06-26 MED ORDER — POTASSIUM CHLORIDE CRYS ER 20 MEQ PO TBCR
40.0000 meq | EXTENDED_RELEASE_TABLET | Freq: Two times a day (BID) | ORAL | Status: AC
  Administered 2024-06-26 (×2): 40 meq via ORAL
  Filled 2024-06-26 (×2): qty 2

## 2024-06-26 MED ORDER — LINEZOLID 600 MG PO TABS
600.0000 mg | ORAL_TABLET | Freq: Two times a day (BID) | ORAL | Status: DC
  Administered 2024-06-26 – 2024-06-29 (×7): 600 mg via ORAL
  Filled 2024-06-26 (×7): qty 1

## 2024-06-26 MED ORDER — ACETAMINOPHEN 650 MG RE SUPP: 650.0000 mg | Freq: Four times a day (QID) | RECTAL | Status: DC | PRN

## 2024-06-26 MED ORDER — METRONIDAZOLE 500 MG/100ML IV SOLN
500.0000 mg | Freq: Two times a day (BID) | INTRAVENOUS | Status: DC
  Administered 2024-06-26 – 2024-06-28 (×4): 500 mg via INTRAVENOUS
  Filled 2024-06-26 (×4): qty 100

## 2024-06-26 MED ORDER — BUPROPION HCL ER (XL) 150 MG PO TB24
150.0000 mg | ORAL_TABLET | Freq: Every morning | ORAL | Status: DC
  Administered 2024-06-26 – 2024-06-29 (×4): 150 mg via ORAL
  Filled 2024-06-26 (×4): qty 1

## 2024-06-26 MED ORDER — ACETAMINOPHEN 325 MG PO TABS
650.0000 mg | ORAL_TABLET | Freq: Four times a day (QID) | ORAL | Status: DC | PRN
  Administered 2024-06-27: 650 mg via ORAL
  Filled 2024-06-26 (×2): qty 2

## 2024-06-26 MED ORDER — ATORVASTATIN CALCIUM 20 MG PO TABS
80.0000 mg | ORAL_TABLET | Freq: Every day | ORAL | Status: DC
  Administered 2024-06-26 – 2024-06-29 (×4): 80 mg via ORAL
  Filled 2024-06-26 (×4): qty 4

## 2024-06-26 MED ORDER — TRAZODONE HCL 50 MG PO TABS
150.0000 mg | ORAL_TABLET | Freq: Every day | ORAL | Status: DC
  Administered 2024-06-26 – 2024-06-28 (×3): 150 mg via ORAL
  Filled 2024-06-26 (×3): qty 1

## 2024-06-26 MED ORDER — PROCHLORPERAZINE EDISYLATE 10 MG/2ML IJ SOLN: 5.0000 mg | Freq: Four times a day (QID) | INTRAMUSCULAR | Status: DC | PRN

## 2024-06-26 MED ORDER — GADOBUTROL 1 MMOL/ML IV SOLN
10.0000 mL | Freq: Once | INTRAVENOUS | Status: AC | PRN
  Administered 2024-06-26: 10 mL via INTRAVENOUS

## 2024-06-26 MED ORDER — ESCITALOPRAM OXALATE 20 MG PO TABS
20.0000 mg | ORAL_TABLET | Freq: Every day | ORAL | Status: DC
  Administered 2024-06-26 – 2024-06-29 (×4): 20 mg via ORAL
  Filled 2024-06-26 (×4): qty 1

## 2024-06-26 MED ORDER — IRBESARTAN 75 MG PO TABS
75.0000 mg | ORAL_TABLET | Freq: Every day | ORAL | Status: DC
  Administered 2024-06-26 – 2024-06-29 (×4): 75 mg via ORAL
  Filled 2024-06-26 (×4): qty 1

## 2024-06-26 MED ORDER — INSULIN ASPART 100 UNIT/ML IJ SOLN
0.0000 [IU] | Freq: Every day | INTRAMUSCULAR | Status: DC
  Administered 2024-06-26: 3 [IU] via SUBCUTANEOUS

## 2024-06-26 MED ORDER — PANTOPRAZOLE SODIUM 40 MG PO TBEC
40.0000 mg | DELAYED_RELEASE_TABLET | Freq: Every day | ORAL | Status: DC
  Administered 2024-06-26 – 2024-06-29 (×4): 40 mg via ORAL
  Filled 2024-06-26 (×4): qty 1

## 2024-06-26 MED ORDER — INSULIN ASPART 100 UNIT/ML IJ SOLN
0.0000 [IU] | Freq: Three times a day (TID) | INTRAMUSCULAR | Status: DC
  Administered 2024-06-26: 2 [IU] via SUBCUTANEOUS
  Administered 2024-06-27: 8 [IU] via SUBCUTANEOUS
  Administered 2024-06-27 – 2024-06-28 (×3): 5 [IU] via SUBCUTANEOUS
  Administered 2024-06-28: 8 [IU] via SUBCUTANEOUS
  Administered 2024-06-29: 5 [IU] via SUBCUTANEOUS

## 2024-06-26 MED ORDER — INSULIN ASPART 100 UNIT/ML IJ SOLN
0.0000 [IU] | Freq: Three times a day (TID) | INTRAMUSCULAR | Status: DC
  Administered 2024-06-26: 3 [IU] via SUBCUTANEOUS
  Administered 2024-06-26: 4 [IU] via SUBCUTANEOUS

## 2024-06-26 NOTE — Progress Notes (Signed)
 ABI's have been completed. Preliminary results can be found in CV Proc through chart review.   06/26/24 10:15 AM Cathlyn Collet RVT

## 2024-06-26 NOTE — Consult Note (Signed)
 Orthopedic Surgery Consult Note  Assessment: Patient is a 53 y.o. female with left fifth toe abscess   Plan: -Planning for I&D tomorrow at [facility]. Please transfer to [facility] -Diet: NPO at midnight -Antibiotics: per primary -Weight bearing status: as tolerated -PT evaluate and treat -Pain control -Dispo: pending completion of operative plans   Discussed recommendation for operative intervention in the form of left fifth toe I&D. Talked about amputation as well but patient is not interested in any kind of amputation. Explained the risks of this procedure included, but were not limited to: persistent infection, wound dehiscence, bleeding, stiffness, need for additional procedures, deep vein thrombosis, pulmonary embolism, and death. The benefits of this procedure would be to help clear the infection and collect cultures to better tailor antibiotic management going forward. The alternatives of this surgery would be to treat the infection with antibiotics alone or to do no intervention. The patient's questions were answered to her satisfaction. After this discussion, patient elected to proceed with surgery. Informed consent was obtained.   ___________________________________________________________________________   Reason for consult: left fifth toe infection  History:  Patient is a 53 y.o. female with history of poorly controlled diabetes and diabetic neuropathy who presented with swelling and pain over her lateral forefoot. There was no trauma or injury that she can recall. She first noticed this on Saturday. She has not had any ulcers in the past and has not noticed any ulcers around her foot recently. She said she may have bumped the toe or injured but with her neuropathy, she has difficulty knowing if she injures her foot.   Review of systems: General: denies fevers and chills, myalgias Neurologic: denies recent changes in vision, slurred speech Abdomen: denies nausea, vomiting,  hematemesis Respiratory: denies cough, shortness of breath  Past medical history:  Diabetes with neuropathy (last A1c was 11.1 on 02/17/2024) Depression HLD IBS  Allergies: penicillin    Past surgical history:  C section  Cholecystectomy  Social history: Denies use of nicotine-containing products (cigarettes, vaping, smokeless, etc.) Alcohol use: denies Denies use of recreational drugs  Family history: -reviewed and not pertinent to left fifth toe abscess   Physical Exam:  BMI of 35.3  General: no acute distress, appears stated age Neurologic: alert, answering questions appropriately, following commands Cardiovascular: regular rate, no cyanosis Respiratory: unlabored breathing on room air, symmetric chest rise Psychiatric: appropriate affect, normal cadence to speech  MSK:   -Left lower foot  TTP over the left lateral forefoot, blister with underlying hematoma at the lateral aspect of the left fifth toe which does not extend proximally to the phalanges, no ulcer or wound seen EHL/TA/GSC intact Plantarflexes and dorsiflexes toes Sensation intact to light touch in sural, saphenous, tibial, deep peroneal, and superficial peroneal nerve distributions (significantly decreased in all distributions from neuropathy)  Foot warm and well perfused  ESR was 59 CRP was 6.5  Imaging: MRI of the left toes from 06/26/2024 was independently reviewed and interpreted, showing fluid collection over the lateral aspect of the fifth toe. Subtle changes in the marrow of the fifth toe phalanges, possible early osteomyelitis. No fracture or dislocation seen.    Patient name: Erica [name] Patient MRN: [id number] Date: 06/26/24

## 2024-06-26 NOTE — TOC Initial Note (Addendum)
 Transition of Care [facility]) - Initial/Assessment Note    Patient Details  Name: Erica [name] MRN: [id number] Date of Birth: 1971/05/07  Transition of Care [facility]) CM/SW Contact:    Heather DELENA Saltness, LCSW Phone Number: 06/26/2024, 10:34 AM  Clinical Narrative:                 TOC consulted for medication assistance and home health needs. CSW met with pt at bedside to discuss discharge planning. Pt reports she currently does not have insurance. Pt reports she uses [facility] for her medication needs. Pt reports no difficultly affording medication at this time. Pt denies needing any home health/DME services. Pt reports family will transport her upon discharge. No further TOC needs at this time. TOC signing off.    Expected Discharge Plan: Home/Self Care Barriers to Discharge: Continued Medical Work up   Patient Goals and CMS Choice Patient states their goals for this hospitalization and ongoing recovery are:: To return home        Expected Discharge Plan and Services In-house Referral: Clinical Social Work Discharge Planning Services: NA Post Acute Care Choice: NA Living arrangements for the past 2 months: Apartment                 DME Arranged: N/A DME Agency: NA       HH Arranged: NA HH Agency: NA        Prior Living Arrangements/Services Living arrangements for the past 2 months: Apartment Lives with:: Self Patient language and need for interpreter reviewed:: Yes Do you feel safe going back to the place where you live?: Yes      Need for Family Participation in Patient Care: No (Comment) Care giver support system in place?: No (comment)   Criminal Activity/Legal Involvement Pertinent to Current Situation/Hospitalization: No - Comment as needed  Activities of Daily Living   ADL Screening (condition at time of admission) Independently performs ADLs?: Yes (appropriate for developmental age) Is the patient deaf or have difficulty hearing?: No Does the  patient have difficulty seeing, even when wearing glasses/contacts?: No Does the patient have difficulty concentrating, remembering, or making decisions?: No  Permission Sought/Granted   Permission granted to share information with : No  Emotional Assessment Appearance:: Appears stated age, Well-Groomed Attitude/Demeanor/Rapport: Engaged Affect (typically observed): Stable, Pleasant, Appropriate Orientation: : Oriented to Self, Oriented to Place, Oriented to  Time, Oriented to Situation Alcohol / Substance Use: Not Applicable Psych Involvement: No (comment)  Admission diagnosis:  Hypokalemia [E87.6] SIRS (systemic inflammatory response syndrome) (HCC) [R65.10] Cellulitis of left lower extremity [L03.116] Hematoma of left little toe [S90.122A] Patient Active Problem List   Diagnosis Date Noted   Hypokalemia 06/26/2024   Hypertension 06/26/2024   Depression    Cellulitis of left lower extremity 06/25/2024   Rash and other nonspecific skin eruption 02/22/2024   Psychophysiological insomnia 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   Severe episode of recurrent major depressive disorder, without psychotic features (HCC) 02/16/2023   Abnormal uterine bleeding (AUB) 04/20/2022   Hyperpigmentation of skin 04/20/2022   Puncture wound of right foot 02/08/2020   Sinusitis 10/31/2019   Urine finding 10/31/2019   Microalbuminuria 05/27/2018   Hyperlipidemia 05/24/2017   Inflammatory disorder of digestive tract 05/24/2017   Low back pain 05/24/2017   Type 2 diabetes mellitus with hyperglycemia, without long-term current use of insulin  (HCC) 05/24/2017   Vitamin D  deficiency 05/24/2017   Otalgia of both ears 06/09/2016   Temporomandibular joint (TMJ) pain 06/09/2016   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 [place] [address] Phone: [phone] Fax:  [phone]  [facility] - [facility] [address] [place] [address] Phone: [phone] Fax: [phone]  [facility] [place] [address] [place] [address] Phone: [phone] Fax: [phone]  [id number] [place], [place] - [address] AT [facility] OF [address] [place] [address] Phone: [phone] Fax: [phone]    Social Drivers of Health (SDOH) Social History: SDOH Screenings   Food Insecurity: No Food Insecurity (06/26/2024)  Housing: Low Risk  (06/26/2024)  Transportation Needs: No Transportation Needs (06/26/2024)  Utilities: Not At Risk (06/26/2024)  Depression (PHQ2-9): Medium Risk (02/17/2024)  Social Connections: Unknown (04/13/2022)   Received from [facility]  Tobacco Use: Low Risk  (06/26/2024)   SDOH Interventions: None indicated     Readmission Risk Interventions    06/26/2024   10:32 AM  Readmission Risk Prevention Plan  Transportation Screening Complete  PCP or Specialist Appt within 5-7 Days Complete  Home Care Screening Complete  Medication Review (RN CM) Complete    Heather Saltness, MSW, LCSW 06/26/2024 10:37 AM

## 2024-06-26 NOTE — Progress Notes (Signed)
 Initial Nutrition Assessment  DOCUMENTATION CODES:   Obesity unspecified  INTERVENTION:   -Reviewed diet principles with patient to aid wound healing  -500 mg Vitamin C  BID -220 mg Zinc  sulfate daily x 14 days   -Placed Carbohydrate Counting handout in AVS  NUTRITION DIAGNOSIS:   Increased nutrient needs related to wound healing as evidenced by estimated needs.  GOAL:   Patient will meet greater than or equal to 90% of their needs  MONITOR:   PO intake  REASON FOR ASSESSMENT:   Consult Wound healing  ASSESSMENT:   53 y.o. female with medical history significant for hypertension, type 2 diabetes mellitus, depression, and insomnia who presents with worsening left foot pain, redness, and swelling.Patient has a chronic ulcer involving the left fifth toe.  Patient in room, received lunch tray. Ready to eat but particular about meats so is mainly ordering vegetables. Pt consuming 100% of meals. States her main issue has been drinking regular soda and sweet tea. Pt describes craving Pepsi especially when stressed. 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 Reviewed ways to increase protein in diet and recommended pt try to not drink sweetened beverages.  Pt agreeable to vitamin supplementation to aid in wound healing.  Per weight records, pt's weight has remained stable recently.  Medications: KLOR-CON , IV Mg sulfate  Labs reviewed: CBGs: 265-333 Low potassium Low magnesium    NUTRITION - FOCUSED PHYSICAL EXAM:  No depletions noted.  Diet Order:   Diet Order             Diet regular Room service appropriate? Yes; Fluid consistency: Thin  Diet effective now                   EDUCATION NEEDS:   Education needs have been addressed  Skin:  Skin Assessment: Reviewed RN Assessment  Last BM:  PTA  Height:   Ht Readings from Last 1 Encounters:  06/25/24 6' (1.829 m)    Weight:   Wt  Readings from Last 1 Encounters:  06/25/24 117.9 kg    BMI:  Body mass index is 35.25 kg/m.  Estimated Nutritional Needs:   Kcal:  1800-2000  Protein:  105-115g  Fluid:  2L/day   Morna Lee, MS, RD, LDN Inpatient Clinical Dietitian Contact via Secure chat

## 2024-06-26 NOTE — Discharge Instructions (Signed)
 Carbohydrate Counting For People With Diabetes  Foods with carbohydrates make your blood glucose level go up. Learning how to count carbohydrates can help you control your blood glucose levels. First, identify the foods you eat that contain carbohydrates. Then, using the Foods with Carbohydrates chart, determine about how much carbohydrates are in your meals and snacks. Make sure you are eating foods with fiber, protein, and healthy fat along with your carbohydrate foods. Foods with Carbohydrates The following table shows carbohydrate foods that have about 15 grams of carbohydrate each. Using measuring cups, spoons, or a food scale when you first begin learning about carbohydrate counting can help you learn about the portion sizes you typically eat. The following foods have 15 grams carbohydrate each:  Grains 1 slice bread (1 ounce)  1 small tortilla (6-inch size)   large bagel (1 ounce)  1/3 cup pasta or rice (cooked)   hamburger or hot dog bun ( ounce)   cup cooked cereal   to  cup ready-to-eat cereal  2 taco shells (5-inch size) Fruit 1 small fresh fruit ( to 1 cup)   medium banana  17 small grapes (3 ounces)  1 cup melon or berries   cup canned or frozen fruit  2 tablespoons dried fruit (blueberries, cherries, cranberries, raisins)   cup unsweetened fruit juice  Starchy Vegetables  cup cooked beans, peas, corn, potatoes/sweet potatoes   large baked potato (3 ounces)  1 cup acorn or butternut squash  Snack Foods 3 to 6 crackers  8 potato chips or 13 tortilla chips ( ounce to 1 ounce)  3 cups popped popcorn  Dairy 3/4 cup (6 ounces) nonfat plain yogurt, or yogurt with sugar-free sweetener  1 cup milk  1 cup plain rice, soy, coconut or flavored almond milk Sweets and Desserts  cup ice cream or frozen yogurt  1 tablespoon jam, jelly, pancake syrup, table sugar, or honey  2 tablespoons light pancake syrup  1 inch square of frosted cake or 2 inch square of unfrosted  cake  2 small cookies (2/3 ounce each) or  large cookie  Sometimes you'll have to estimate carbohydrate amounts if you don't know the exact recipe. One cup of mixed foods like soups can have 1 to 2 carbohydrate servings, while some casseroles might have 2 or more servings of carbohydrate. Foods that have less than 20 calories in each serving can be counted as "free" foods. Count 1 cup raw vegetables, or  cup cooked non-starchy vegetables as "free" foods. If you eat 3 or more servings at one meal, then count them as 1 carbohydrate serving.  Foods without Carbohydrates  Not all foods contain carbohydrates. Meat, some dairy, fats, non-starchy vegetables, and many beverages don't contain carbohydrate. So when you count carbohydrates, you can generally exclude chicken, pork, beef, fish, seafood, eggs, tofu, cheese, butter, sour cream, avocado, nuts, seeds, olives, mayonnaise, water, black coffee, unsweetened tea, and zero-calorie drinks. Vegetables with no or low carbohydrate include green beans, cauliflower, tomatoes, and onions. How much carbohydrate should I eat at each meal?  Carbohydrate counting can help you plan your meals and manage your weight. Following are some starting points for carbohydrate intake at each meal. Work with your registered dietitian nutritionist to find the best range that works for your blood glucose and weight.   To Lose Weight To Maintain Weight  Women 2 - 3 carb servings 3 - 4 carb servings  Men 3 - 4 carb servings 4 - 5 carb servings  Checking your  blood glucose after meals will help you know if you need to adjust the timing, type, or number of carbohydrate servings in your meal plan. Achieve and keep a healthy body weight by balancing your food intake and physical activity.  Tips How should I plan my meals?  Plan for half the food on your plate to include non-starchy vegetables, like salad greens, broccoli, or carrots. Try to eat 3 to 5 servings of non-starchy vegetables  every day. Have a protein food at each meal. Protein foods include chicken, fish, meat, eggs, or beans (note that beans contain carbohydrate). These two food groups (non-starchy vegetables and proteins) are low in carbohydrate. If you fill up your plate with these foods, you will eat less carbohydrate but still fill up your stomach. Try to limit your carbohydrate portion to  of the plate.  What fats are healthiest to eat?  Diabetes increases risk for heart disease. To help protect your heart, eat more healthy fats, such as olive oil, nuts, and avocado. Eat less saturated fats like butter, cream, and high-fat meats, like bacon and sausage. Avoid trans fats, which are in all foods that list "partially hydrogenated oil" as an ingredient. What should I drink?  Choose drinks that are not sweetened with sugar. The healthiest choices are water, carbonated or seltzer waters, and tea and coffee without added sugars.  Sweet drinks will make your blood glucose go up very quickly. One serving of soda or energy drink is  cup. It is best to drink these beverages only if your blood glucose is low.  Artificially sweetened, or diet drinks, typically do not increase your blood glucose if they have zero calories in them. Read labels of beverages, as some diet drinks do have carbohydrate and will raise your blood glucose. Label Reading Tips Read Nutrition Facts labels to find out how many grams of carbohydrate are in a food you want to eat. Don't forget: sometimes serving sizes on the label aren't the same as how much food you are going to eat, so you may need to calculate how much carbohydrate is in the food you are serving yourself.   Carbohydrate Counting for People with Diabetes Sample 1-Day Menu  Breakfast  cup yogurt, low fat, low sugar (1 carbohydrate serving)   cup cereal, ready-to-eat, unsweetened (1 carbohydrate serving)  1 cup strawberries (1 carbohydrate serving)   cup almonds ( carbohydrate serving)   Lunch 1, 5 ounce can chunk light tuna  2 ounces cheese, low fat cheddar  6 whole wheat crackers (1 carbohydrate serving)  1 small apple (1 carbohydrate servings)   cup carrots ( carbohydrate serving)   cup snap peas  1 cup 1% milk (1 carbohydrate serving)   Evening Meal Stir fry made with: 3 ounces chicken  1 cup brown rice (3 carbohydrate servings)   cup broccoli ( carbohydrate serving)   cup green beans   cup onions  1 tablespoon olive oil  2 tablespoons teriyaki sauce ( carbohydrate serving)  Evening Snack 1 extra small banana (1 carbohydrate serving)  1 tablespoon peanut butter   Carbohydrate Counting for People with Diabetes Vegan Sample 1-Day Menu  Breakfast 1 cup cooked oatmeal (2 carbohydrate servings)   cup blueberries (1 carbohydrate serving)  2 tablespoons flaxseeds  1 cup soymilk fortified with calcium  and vitamin D   1 cup coffee  Lunch 2 slices whole wheat bread (2 carbohydrate servings)   cup baked tofu   cup lettuce  2 slices tomato  2 slices avocado  cup baby carrots ( carbohydrate serving)  1 orange (1 carbohydrate serving)  1 cup soymilk fortified with calcium  and vitamin D    Evening Meal Burrito made with: 1 6-inch corn tortilla (1 carbohydrate serving)  1 cup refried vegetarian beans (2 carbohydrate servings)   cup chopped tomatoes   cup lettuce   cup salsa  1/3 cup brown rice (1 carbohydrate serving)  1 tablespoon olive oil for rice   cup zucchini   Evening Snack 6 small whole grain crackers (1 carbohydrate serving)  2 apricots ( carbohydrate serving)   cup unsalted peanuts ( carbohydrate serving)    Carbohydrate Counting for People with Diabetes Vegetarian (Lacto-Ovo) Sample 1-Day Menu  Breakfast 1 cup cooked oatmeal (2 carbohydrate servings)   cup blueberries (1 carbohydrate serving)  2 tablespoons flaxseeds  1 egg  1 cup 1% milk (1 carbohydrate serving)  1 cup coffee  Lunch 2 slices whole wheat bread (2 carbohydrate  servings)  2 ounces low-fat cheese   cup lettuce  2 slices tomato  2 slices avocado   cup baby carrots ( carbohydrate serving)  1 orange (1 carbohydrate serving)  1 cup unsweetened tea  Evening Meal Burrito made with: 1 6-inch corn tortilla (1 carbohydrate serving)   cup refried vegetarian beans (1 carbohydrate serving)   cup tomatoes   cup lettuce   cup salsa  1/3 cup brown rice (1 carbohydrate serving)  1 tablespoon olive oil for rice   cup zucchini  1 cup 1% milk (1 carbohydrate serving)  Evening Snack 6 small whole grain crackers (1 carbohydrate serving)  2 apricots ( carbohydrate serving)   cup unsalted peanuts ( carbohydrate serving)    Copyright 2020  Academy of [employer] and Dietetics. All rights reserved.  Using Nutrition Labels: Carbohydrate  Serving Size  Look at the serving size. All the information on the label is based on this portion. Servings Per Container  The number of servings contained in the package. Guidelines for Carbohydrate  Look at the total grams of carbohydrate in the serving size.  1 carbohydrate choice = 15 grams of carbohydrate. Range of Carbohydrate Grams Per Choice  Carbohydrate Grams/Choice Carbohydrate Choices  6-10   11-20 1  21-25 1  26-35 2  36-40 2  41-50 3  51-55 3  56-65 4  66-70 4  71-80 5    Copyright 2020  Academy of [employer] and Dietetics. All rights reserved.    Orthopedic Surgery Discharge Instructions  Patient name: Erica [name] Procedure Performed: left fifth toe abscess incision and drainage Date of Surgery: 06/27/2024 Surgeon: Ozell Ada, MD  Activity: You are allowed to put as much weight on your leg as you would like. You can walk as much as you would like. You should use a post-operative shoe when weight bearing. You do not need it when you are not bearing weight (e.g., when sleeping)  Incision Care: Your incision site has a dressing over it. That dressing should remain in place and  dry at all times for a total of one week after surgery. After one week, you can remove the dressing. Underneath the dressing, you will find skin sutures. You should leave these sutures in place. They will be taken out in the office when the wound has healed. Do not pick, rub, or scrub at them. Do not put cream or lotion over the surgical area. After one week and once the dressing is off, it is okay to let soap and water run over your incision. Again,  do not pick, scrub, or rub at the sutures when bathing. Do not submerge (e.g., take a bath, swim, go in a hot tub, etc.) until the wound has fully healed. There may be some bloody drainage from the incision into the dressing after surgery. This is normal. You do not need to replace the dressing. Continue to leave it in place for the one week as instructed above. Should the dressing become saturated with blood or drainage, please call the office for further instructions.   Reasons to Call the Office After Surgery: You should feel free to call the office with any concerns or questions you have in the post-operative period, but you should definitely notify the office if you develop: -shortness of breath, chest pain, or trouble breathing -excessive bleeding, drainage, redness, or swelling around the surgical site -fevers, chills, or pain that is getting worse with each passing day -persistent nausea or vomiting -other concerns about your surgery  Follow Up Appointments: You have a follow up appointment scheduled with Dr. Georgina on 07/12/2024 at 1:15pm. The office location and phone number are listed below. Please arrive on time to your appointment.   Office Information:  -Ozell Georgina, MD -Phone number: [phone] -Address: [address]       [place], [place] [address]

## 2024-06-26 NOTE — H&P (Signed)
 History and Physical    Erica [name] FMW:[id number] DOB: [birth year] DOA: 06/25/2024  PCP: [name], NP   Patient coming from: Home   Chief Complaint: Increased left foot pain, redness, and swelling   HPI: Erica [name] is a 53 y.o. female with medical history significant for hypertension, type 2 diabetes mellitus, depression, and insomnia who presents with worsening left foot pain, redness, and swelling.  Patient has a chronic ulcer involving the left fifth toe, was treated for surrounding cellulitis in May with good results, but has had recent recurrence and surrounding redness, swelling, and pain.  She was seen for this in the ED yesterday but now returns with worsening redness, pain, and swelling which now extends up past the ankle.  She denies any fevers or chills.  [facility] ED Course: Upon arrival to the ED, patient is found to be afebrile and saturating well on room air with mild tachycardia and stable BP.  Labs are most notable for potassium 3.1, glucose 206, normal WBC, and lactate 2.6.  Plain radiographs demonstrate soft tissue swelling involving the fifth toe without acute osseous abnormality.  Blood cultures were collected and the patient was given 2 L NS, oral potassium, Norco, cefepime , and vancomycin .  She was transferred to [facility] for admission.  Review of Systems:  All other systems reviewed and apart from HPI, are negative.  Past Medical History:  Diagnosis Date   Arthritis    Depression    Diabetes mellitus    Fatty liver    Hernia, abdominal    Hypercholesteremia    IBS (irritable bowel syndrome)     Past Surgical History:  Procedure Laterality Date   CESAREAN SECTION     CHOLECYSTECTOMY      Social History:   reports that she has never smoked. She has never used smokeless tobacco. She reports that she does not drink alcohol and does not use drugs.  Allergies  Allergen Reactions   Penicillins     Reported from childhood     Family History  Problem Relation Age of Onset   Hypertension Mother    Heart failure Mother    Fibroids Mother    Breast cancer Sister    Fibroids Sister    Fibroids Maternal Grandmother    Diabetes type II Paternal Grandmother      Prior to Admission medications   Medication Sig Start Date End Date Taking? Authorizing Provider  aspirin  81 MG tablet Take 81 mg by mouth at bedtime.     [provider]  atorvastatin  (LIPITOR) 40 MG tablet Take 2 tablets (80 mg total) by mouth daily. 03/29/24   [name], NP  Blood Glucose Monitoring Suppl (TRUE METRIX METER) w/Device KIT Use to check blood sugar up to twice daily. 08/03/23   [name], NP  buPROPion  (WELLBUTRIN  XL) 150 MG 24 hr tablet Take 1 tablet (150 mg total) by mouth every morning. 02/14/24   [name], NP  cephALEXin  (KEFLEX ) 500 MG capsule Take 1 capsule (500 mg total) by mouth 4 (four) times daily for 7 days. 06/24/24 07/01/24  Kammerer, Megan L, DO  doxycycline  (VIBRAMYCIN ) 100 MG capsule Take 1 capsule (100 mg total) by mouth 2 (two) times daily for 7 days. 06/24/24 07/01/24  Kammerer, Megan L, DO  ergocalciferol  (VITAMIN D2) 1.25 MG (50000 UT) capsule Take 1 capsule by mouth once a week. 02/23/24   [name], NP  ergocalciferol  (VITAMIN D2) 1.25 MG (50000 UT) capsule Take  1 capsule by mouth once a week. 04/14/24   [name], NP  escitalopram  (LEXAPRO ) 20 MG tablet Take 1 tablet (20 mg total) by mouth daily. 09/22/23   [name], NP  Fluocinolone  Acetonide Body 0.01 % OIL Use as needed to scalp 12/23/23   Nichols, Tonya S, NP  Glucose Blood (BLOOD GLUCOSE TEST STRIPS) STRP Use to check blood sugar up to twice daily. 08/03/23   [name], NP  Lancet Device MISC Use to check blood sugar up to twice daily. May substitute to any manufacturer covered by patient's insurance. Patient not taking: Reported on 05/09/2024 08/03/23   [name], NP  meclizine  (ANTIVERT ) 25 MG tablet Take 1  tablet (25 mg total) by mouth 3 (three) times daily as needed for dizziness. 11/22/23   Ula Prentice SAUNDERS, MD  medroxyPROGESTERone  (PROVERA ) 10 MG tablet Take 1 tablet (10 mg total) by mouth daily. Patient not taking: Reported on 03/21/2024 11/15/23   Eveline Lynwood MATSU, MD  medroxyPROGESTERone  Acetate 150 MG/ML SUSY Inject 1 mL (150 mg total) into the muscle every 3 (three) months. Patient not taking: Reported on 03/21/2024 12/02/23   Zina Jerilynn LABOR, MD  metFORMIN  (GLUCOPHAGE -XR) 500 MG 24 hr tablet Take 2 tablets (1,000 mg total) by mouth 2 (two) times daily with a meal. 06/08/24   [name], NP  omeprazole  (PRILOSEC) 20 MG capsule Take 1 capsule (20 mg total) by mouth daily. Patient not taking: Reported on 03/21/2024 02/05/24   Horton, Charmaine FALCON, MD  ondansetron  (ZOFRAN ) 4 MG tablet Take 1 tablet (4 mg total) by mouth every 8 (eight) hours as needed for nausea or vomiting. Patient not taking: Reported on 02/17/2024 09/22/23   Nichols, Tonya S, NP  oxyCODONE -acetaminophen  (PERCOCET) 7.5-325 MG tablet Take 1 tablet by mouth every 6 (six) hours as needed for severe pain (pain score 7-10). Patient not taking: Reported on 02/17/2024 11/22/23   Ula Prentice SAUNDERS, MD  pregabalin  (LYRICA ) 150 MG capsule Take 1 capsule (150 mg total) by mouth 2 (two) times daily. 10/15/23   [name], NP  Semaglutide , 2 MG/DOSE, (OZEMPIC , 2 MG/DOSE,) 8 MG/3ML SOPN Inject 2 mg as directed once a week. 06/21/24   [name], NP  traZODone  (DESYREL ) 150 MG tablet Take 1 tablet (150 mg total) by mouth at bedtime. 02/17/24   Nichols, Tonya S, NP  triamcinolone  (KENALOG ) 0.025 % ointment Apply 1 Application topically 2 (two) times daily. 04/14/24   [name], NP  valsartan -hydrochlorothiazide  (DIOVAN -HCT) 80-12.5 MG tablet Take 1 tablet by mouth daily. 12/23/23   Nichols, Tonya S, NP  amitriptyline (ELAVIL) 50 MG tablet amitriptyline 50 mg tablet  Take 1 tablet(s) every day by oral route as directed for 30 days.  11/10/20  [provider]  gabapentin (NEURONTIN) 300 MG capsule Take 300 mg by mouth.  11/10/20  [provider]  metFORMIN  (GLUCOPHAGE ) 1000 MG tablet Take 1 tablet (1,000 mg total) by mouth 2 (two) times daily with a meal. 09/22/23   [name], NP    Physical Exam: Vitals:   06/25/24 2300 06/26/24 0100 06/26/24 0215 06/26/24 0249  BP: 136/71 136/72 132/82 (!) 143/81  Pulse:  99 (!) 101 100  Resp:  16  18  Temp:  98.2 F (36.8 C) 98.2 F (36.8 C) 97.8 F (36.6 C)  TempSrc:  Oral Oral Oral  SpO2:  97% 97% 98%  Weight:      Height:  Constitutional: NAD, no pallor or diaphoresis   Eyes: PERTLA, lids and conjunctivae normal ENMT: Mucous membranes are moist. Posterior pharynx clear of any exudate or lesions.   Neck: supple, no masses  Respiratory: no wheezing, no crackles. No accessory muscle use.  Cardiovascular: S1 & S2 heard, regular rate and rhythm. No JVD. Abdomen: No tenderness, soft. Bowel sounds active.  Musculoskeletal: no clubbing / cyanosis. No joint deformity upper and lower extremities.   Skin: Thick callous and ulceration involving left 5th toe with adjacent bulla and surrounding erythema, heat, edema, and tenderness that extends proximally past the ankle. Skin is otherwise warm, dry, well-perfused. Neurologic: CN 2-12 grossly intact. Moving all extremities. Alert and oriented.  Psychiatric: Calm. Cooperative.    Labs and Imaging on Admission: I have personally reviewed following labs and imaging studies  CBC: Recent Labs  Lab 06/25/24 1950  WBC 9.6  NEUTROABS 6.9  HGB 11.4*  HCT 34.5*  MCV 85.4  PLT 304   Basic Metabolic Panel: Recent Labs  Lab 06/25/24 1950  NA 139  K 3.1*  CL 100  CO2 25  GLUCOSE 206*  BUN 6  CREATININE 0.74  CALCIUM  9.8   GFR: Estimated Creatinine Clearance: 120.9 mL/min (by C-G formula based on SCr of 0.74 mg/dL). Liver Function Tests: No results for input(s): AST, ALT, ALKPHOS, BILITOT, PROT,  ALBUMIN in the last 168 hours. No results for input(s): LIPASE, AMYLASE in the last 168 hours. No results for input(s): AMMONIA in the last 168 hours. Coagulation Profile: No results for input(s): INR, PROTIME in the last 168 hours. Cardiac Enzymes: No results for input(s): CKTOTAL, CKMB, CKMBINDEX, TROPONINI in the last 168 hours. BNP (last 3 results) No results for input(s): PROBNP in the last 8760 hours. HbA1C: No results for input(s): HGBA1C in the last 72 hours. CBG: No results for input(s): GLUCAP in the last 168 hours. Lipid Profile: No results for input(s): CHOL, HDL, LDLCALC, TRIG, CHOLHDL, LDLDIRECT in the last 72 hours. Thyroid Function Tests: No results for input(s): TSH, T4TOTAL, FREET4, T3FREE, THYROIDAB in the last 72 hours. Anemia Panel: No results for input(s): VITAMINB12, FOLATE, FERRITIN, TIBC, IRON, RETICCTPCT in the last 72 hours. Urine analysis:    Component Value Date/Time   COLORURINE YELLOW 08/01/2021 1257   APPEARANCEUR CLEAR 08/01/2021 1257   LABSPEC 1.027 08/01/2021 1257   PHURINE 5.5 08/01/2021 1257   GLUCOSEU >1,000 (A) 08/01/2021 1257   HGBUR TRACE (A) 08/01/2021 1257   BILIRUBINUR NEGATIVE 08/01/2021 1257   BILIRUBINUR neg 04/10/2016 1509   KETONESUR 15 (A) 08/01/2021 1257   PROTEINUR TRACE (A) 08/01/2021 1257   UROBILINOGEN negative 04/10/2016 1509   UROBILINOGEN 0.2 12/12/2014 0806   NITRITE NEGATIVE 08/01/2021 1257   LEUKOCYTESUR NEGATIVE 08/01/2021 1257   Sepsis Labs: @LABRCNTIP (procalcitonin:4,lacticidven:4) )No results found for this or any previous visit (from the past 240 hours).   Radiological Exams on Admission: DG Foot Complete Left Result Date: 06/25/2024 CLINICAL DATA:  Fifth toe wound and cellulitis, initial encounter EXAM: LEFT FOOT - COMPLETE 3+ VIEW COMPARISON:  None Available. FINDINGS: Soft tissue swelling is noted in the fifth digit consistent with the physical  exam. No acute fracture or dislocation is noted. No erosive changes to suggest osteomyelitis are noted. Calcaneal spurring is seen. IMPRESSION: No erosive changes to suggest osteomyelitis. Soft tissue swelling in the fifth digit. Electronically Signed   By: Oneil Devonshire M.D.   On: 06/25/2024 20:12    Assessment/Plan   1. Left foot cellulitis  - Presents with chronic  left 5th toe ulcer with adjacent blister and now edema and erythema extending up past the ankle  - She is not septic on admission  - Check MRI and ABI, continue empiric antibiotics, follow cultures and clinical response to treatment   2. Type II DM  - A1c was 11.1% in March 2025  - Check CBGs and use SSI for now    3. Hypertension  - Continue ARB, hold hydrochlorothiazide  for now    4. Hypokalemia  - Replacing    5. Depression, insomnia  - Continue Lexapro , Wellbutrin , trazodone     DVT prophylaxis: Lovenox   Code Status: Full  Level of Care: Level of care: Telemetry Family Communication: none present  Disposition Plan:  Patient is from: home  Anticipated d/c is to: Home  Anticipated d/c date is: 06/29/24  Patient currently: Pending MRI, ABI  Consults called: None Admission status: Inpatient     Evalene GORMAN Sprinkles, MD [facility]  06/26/2024, 4:02 AM

## 2024-06-26 NOTE — Plan of Care (Signed)
   Problem: Education: Goal: Knowledge of General Education information will improve Description: Including pain rating scale, medication(s)/side effects and non-pharmacologic comfort measures Outcome: Progressing   Problem: Activity: Goal: Risk for activity intolerance will decrease Outcome: Progressing

## 2024-06-26 NOTE — Progress Notes (Signed)
 [facility]                                                                               [name], is a 53 y.o. female, DOB - 1971-09-06, FMW:[id number] Admit date - 06/25/2024    Outpatient Primary MD for the patient is [name], NP  LOS - 0  days    Brief summary     Erica [name] is a 53 y.o. female with medical history significant for hypertension, type 2 diabetes mellitus, depression, and insomnia who presents with worsening left foot pain, redness, and swelling. She was transferred to [facility] for admission.    MRI LEFT TOE Soft tissue swelling in the small toe with a superficial fluid collection laterally which could reflect a subacute hematoma or abscess. Given reported 5th toe wound on recent radiographs, infection likely.  Marrow signal abnormality within the proximal and middle 5th phalanges with low level marrow enhancement, suspicious for early osteomyelitis.  Assessment & Plan    Assessment and Plan:    Early osteomyelitis on MRI of the left toe:  - continue with IV antibiotics, orthopedics consulted.  - continue with IV cefepime  and linezolid , flagyl .  - pain control.    Type 2 Diabetes mellitus:  CBG (last 3)  Recent Labs    06/26/24 0844 06/26/24 1141  GLUCAP 333* 265*   Continue with SSI. Get A1c.    Hypokalemia Replaced.   Hypertension Well controlled.    Depression:  Continue with Wellbutrin .    RN Pressure Injury Documentation:    Malnutrition Type:  Nutrition Problem: Increased nutrient needs Etiology: wound healing   Malnutrition Characteristics:  Signs/Symptoms: estimated needs   Nutrition Interventions:  Interventions: MVI, Education  Estimated body mass index is 35.25 kg/m as calculated from the following:   Height as of this encounter: 6' (1.829 m).   Weight as of this encounter: 117.9 kg.  Code Status: full code.  DVT Prophylaxis:  enoxaparin  (LOVENOX ) injection 40 mg  Start: 06/26/24 1000   Level of Care: Level of care: Telemetry Family Communication: pending.   Disposition Plan:     Remains inpatient appropriate:  pending   Procedures:  MRI left foot.   Consultants:   Orthopedics.   Antimicrobials:   Anti-infectives (From admission, onward)    Start     Dose/Rate Route Frequency Ordered Stop   06/26/24 1000  linezolid  (ZYVOX ) tablet 600 mg       Placed in And Linked Group   600 mg Oral Every 12 hours 06/26/24 0322 07/03/24 0959   06/26/24 0500  metroNIDAZOLE  (FLAGYL ) IVPB 500 mg        500 mg 100 mL/hr over 60 Minutes Intravenous Every 12 hours 06/26/24 0401     06/26/24 0500  ceFEPIme  (MAXIPIME ) 2 g in sodium chloride  0.9 % 100 mL IVPB        2 g 200 mL/hr over 30 Minutes Intravenous Every 8 hours 06/26/24 0414     06/25/24 1945  vancomycin  (VANCOCIN ) IVPB 1000 mg/200 mL premix        1,000 mg 200 mL/hr over 60 Minutes Intravenous  Once 06/25/24 1931 06/25/24 2246  06/25/24 1945  ceFEPIme  (MAXIPIME ) 2 g in sodium chloride  0.9 % 100 mL IVPB        2 g 200 mL/hr over 30 Minutes Intravenous  Once 06/25/24 1931 06/25/24 2135        Medications  Scheduled Meds:  ascorbic acid   500 mg Oral BID   atorvastatin   80 mg Oral Daily   buPROPion   150 mg Oral q morning   enoxaparin  (LOVENOX ) injection  40 mg Subcutaneous Q24H   escitalopram   20 mg Oral Daily   insulin  aspart  0-5 Units Subcutaneous QHS   insulin  aspart  0-6 Units Subcutaneous TID WC   irbesartan   75 mg Oral Daily   linezolid   600 mg Oral Q12H   pantoprazole   40 mg Oral Daily   potassium chloride   40 mEq Oral BID   sodium chloride  flush  3 mL Intravenous Q12H   traZODone   150 mg Oral QHS   zinc  sulfate (50mg  elemental zinc )  220 mg Oral Daily   Continuous Infusions:  ceFEPime  (MAXIPIME ) IV 2 g (06/26/24 1245)   metronidazole  500 mg (06/26/24 0459)   PRN Meds:.acetaminophen  **OR** acetaminophen , HYDROmorphone  (DILAUDID ) injection, oxyCODONE , polyethylene glycol,  prochlorperazine     Subjective:   Erica [name] was seen and examined today. Pain and tenderness in the left foot.   Objective:   Vitals:   06/26/24 0249 06/26/24 0659 06/26/24 1011 06/26/24 1316  BP: (!) 143/81 (!) 142/90 (!) 159/92 (!) 149/84  Pulse: 100 95 96 98  Resp: 18 16 16 16   Temp: 97.8 F (36.6 C) 97.8 F (36.6 C) 98.2 F (36.8 C) 97.9 F (36.6 C)  TempSrc: Oral Oral Oral Oral  SpO2: 98% 95% 99% 97%  Weight:      Height:        Intake/Output Summary (Last 24 hours) at 06/26/2024 1552 Last data filed at 06/26/2024 1343 Gross per 24 hour  Intake 3273.72 ml  Output --  Net 3273.72 ml   Filed Weights   06/25/24 1857  Weight: 117.9 kg     Exam General exam: Appears calm and comfortable  Respiratory system: Clear to auscultation. Respiratory effort normal. Cardiovascular system: S1 & S2 heard, RRR. No JVD,  Gastrointestinal system: Abdomen is nondistended, soft and nontender.  Central nervous system: Alert and oriented. No focal neurological deficits. Extremities: 5 th toe swelling and tenderness.  Skin: No rashes Psychiatry: Mood & affect appropriate.    Data Reviewed:  I have personally reviewed following labs and imaging studies   CBC Lab Results  Component Value Date   WBC 8.6 06/26/2024   RBC 3.90 06/26/2024   HGB 10.9 (L) 06/26/2024   HCT 33.8 (L) 06/26/2024   MCV 86.7 06/26/2024   MCH 27.9 06/26/2024   PLT 287 06/26/2024   MCHC 32.2 06/26/2024   RDW 11.9 06/26/2024   LYMPHSABS 1.7 06/25/2024   MONOABS 0.7 06/25/2024   EOSABS 0.1 06/25/2024   BASOSABS 0.0 06/25/2024     Last metabolic panel Lab Results  Component Value Date   NA 137 06/26/2024   K 3.3 (L) 06/26/2024   CL 103 06/26/2024   CO2 22 06/26/2024   BUN 6 06/26/2024   CREATININE 0.57 06/26/2024   GLUCOSE 272 (H) 06/26/2024   GFRNONAA >60 06/26/2024   GFRAA >90 12/12/2014   CALCIUM  8.6 (L) 06/26/2024   PROT 6.9 02/07/2024   ALBUMIN 4.3 02/07/2024   LABGLOB 2.6  02/07/2024   AGRATIO 1.8 02/17/2023   BILITOT 0.3 02/07/2024   ALKPHOS  94 02/07/2024   AST 14 02/07/2024   ALT 15 02/07/2024   ANIONGAP 12 06/26/2024    CBG (last 3)  Recent Labs    06/26/24 0844 06/26/24 1141  GLUCAP 333* 265*      Coagulation Profile: No results for input(s): INR, PROTIME in the last 168 hours.   Radiology Studies: MR TOES LEFT W WO CONTRAST Result Date: 06/26/2024 CLINICAL DATA:  Foot swelling, diabetic, osteomyelitis suspected, xray done EXAM: MRI OF THE LEFT TOES WITHOUT AND WITH CONTRAST TECHNIQUE: Multiplanar, multisequence MR imaging of the left forefoot was performed both before and after administration of intravenous contrast. CONTRAST:  10mL GADAVIST  GADOBUTROL  1 MMOL/ML IV SOLN COMPARISON:  Foot radiographs 06/25/2024 and 03/07/2007. FINDINGS: Bones/Joint/Cartilage As seen on radiographs, there is soft tissue swelling in the small toe with a superficial fluid collection laterally, further described below. T2 marrow hyperintensity within the proximal and middle 5th phalanges persists on the inversion recovery images and is associated with low level marrow enhancement following contrast. No gross cortical destruction identified. No significant joint effusions. 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 There are mild degenerative changes at the 1st tarsometatarsal joint. Ligaments Intact Lisfranc ligament. The collateral ligaments of the metatarsophalangeal joints appear intact. Muscles and Tendons No significant musculotendinous findings within the forefoot. Soft tissues As above, there is soft tissue swelling in the small toe with a peripherally enhancing superficial fluid collection laterally which measures 2.4 x 2.6 x 1.1 cm. This collection demonstrates intermediate T1 signal prior to contrast and could reflect a subacute hematoma or abscess. There is surrounding  subcutaneous enhancement. Dorsal subcutaneous edema without enhancement. IMPRESSION: 1. Soft tissue swelling in the small toe with a superficial fluid collection laterally which could reflect a subacute hematoma or abscess. Given reported 5th toe wound on recent radiographs, infection likely. 2. Marrow signal abnormality within the proximal and middle 5th phalanges with low level marrow enhancement, suspicious for early osteomyelitis. No gross cortical destruction identified. 3. No other acute osseous findings. Electronically Signed   By: Elsie Perone M.D.   On: 06/26/2024 10:21   VAS US  ABI WITH/WO TBI Result Date: 06/26/2024  LOWER EXTREMITY DOPPLER STUDY Patient Name:  Dwanda Tufano  Date of Exam:   06/26/2024 Medical Rec #: [id number]     Accession #:    [id number] Date of Birth: 1971-03-04     Patient Gender: F Patient Age:   34 years Exam Location:  [facility] Procedure:      VAS US  ABI WITH/WO TBI Referring Phys: --------------------------------------------------------------------------------  Indications: Ulceration. High Risk Factors: Hypertension, Diabetes.  Comparison Study: No prior studies. Performing Technologist: Gerome Ny RVT  Examination Guidelines: A complete evaluation includes at minimum, Doppler waveform signals and systolic blood pressure reading at the level of bilateral brachial, anterior tibial, and posterior tibial arteries, when vessel segments are accessible. Bilateral testing is considered an integral part of a complete examination. Photoelectric Plethysmograph (PPG) waveforms and toe systolic pressure readings are included as required and additional duplex testing as needed. Limited examinations for reoccurring indications may be performed as noted.  ABI Findings: +---------+------------------+-----+-----------+--------+ Right    Rt Pressure (mmHg)IndexWaveform   Comment  +---------+------------------+-----+-----------+--------+ Brachial 139                     triphasic           +---------+------------------+-----+-----------+--------+ PTA      166  1.11 triphasic           +---------+------------------+-----+-----------+--------+ DP       161               1.08 multiphasic         +---------+------------------+-----+-----------+--------+ Great Toe153               1.03                     +---------+------------------+-----+-----------+--------+ +---------+------------------+-----+-----------+-------+ Left     Lt Pressure (mmHg)IndexWaveform   Comment +---------+------------------+-----+-----------+-------+ Brachial 149                    triphasic          +---------+------------------+-----+-----------+-------+ PTA      164               1.10 triphasic          +---------+------------------+-----+-----------+-------+ DP       167               1.12 multiphasic        +---------+------------------+-----+-----------+-------+ Great Toe155               1.04                    +---------+------------------+-----+-----------+-------+ +-------+-----------+-----------+------------+------------+ ABI/TBIToday's ABIToday's TBIPrevious ABIPrevious TBI +-------+-----------+-----------+------------+------------+ Right  1.11       1.03                                +-------+-----------+-----------+------------+------------+ Left   1.12       1.04                                +-------+-----------+-----------+------------+------------+  Summary: Right: Resting right ankle-brachial index is within normal range. The right toe-brachial index is normal. Left: Resting left ankle-brachial index is within normal range. The left toe-brachial index is normal. *See table(s) above for measurements and observations.     Preliminary    DG Foot Complete Left Result Date: 06/25/2024 CLINICAL DATA:  Fifth toe wound and cellulitis, initial encounter EXAM: LEFT FOOT - COMPLETE 3+ VIEW COMPARISON:  None Available.  FINDINGS: Soft tissue swelling is noted in the fifth digit consistent with the physical exam. No acute fracture or dislocation is noted. No erosive changes to suggest osteomyelitis are noted. Calcaneal spurring is seen. IMPRESSION: No erosive changes to suggest osteomyelitis. Soft tissue swelling in the fifth digit. Electronically Signed   By: Oneil Devonshire M.D.   On: 06/25/2024 20:12       Elgie Butter M.D. [facility] 06/26/2024, 3:52 PM  Available via Epic secure chat 7am-7pm After 7 pm, please refer to night coverage provider listed on amion.

## 2024-06-26 NOTE — Progress Notes (Signed)
 PT Cancellation Note  Patient Details Name: Erica [name] MRN: [id number] DOB: September 08, 1971   Cancelled Treatment:    Reason Eval/Treat Not Completed: PT screened, no needs identified, will sign offPatient is independent [name] PT Acute Rehabilitation Services Office [phone]   Potters Darice Norris 06/26/2024, 4:30 PM

## 2024-06-26 NOTE — Plan of Care (Signed)
   Problem: Education: Goal: Knowledge of General Education information will improve Description Including pain rating scale, medication(s)/side effects and non-pharmacologic comfort measures Outcome: Progressing   Problem: Health Behavior/Discharge Planning: Goal: Ability to manage health-related needs will improve Outcome: Progressing

## 2024-06-27 ENCOUNTER — Inpatient Hospital Stay (HOSPITAL_COMMUNITY): Admitting: Anesthesiology

## 2024-06-27 ENCOUNTER — Encounter (HOSPITAL_COMMUNITY)
Admission: EM | Disposition: A | Discharge status: Home / Self Care | Payer: Self-pay | Source: Home / Self Care | Attending: Internal Medicine

## 2024-06-27 ENCOUNTER — Other Ambulatory Visit: Payer: Self-pay

## 2024-06-27 ENCOUNTER — Encounter (HOSPITAL_COMMUNITY): Payer: Self-pay | Admitting: Family Medicine

## 2024-06-27 DIAGNOSIS — L02612 Cutaneous abscess of left foot: Secondary | ICD-10-CM

## 2024-06-27 DIAGNOSIS — I10 Essential (primary) hypertension: Secondary | ICD-10-CM

## 2024-06-27 DIAGNOSIS — L089 Local infection of the skin and subcutaneous tissue, unspecified: Secondary | ICD-10-CM

## 2024-06-27 LAB — CBC
HCT: 35.9 % — ABNORMAL LOW (ref 36.0–46.0)
Hemoglobin: 11.5 g/dL — ABNORMAL LOW (ref 12.0–15.0)
MCH: 28 pg (ref 26.0–34.0)
MCHC: 32 g/dL (ref 30.0–36.0)
MCV: 87.6 fL (ref 80.0–100.0)
Platelets: 296 K/uL (ref 150–400)
RBC: 4.1 MIL/uL (ref 3.87–5.11)
RDW: 11.9 % (ref 11.5–15.5)
WBC: 8.6 K/uL (ref 4.0–10.5)
nRBC: 0 % (ref 0.0–0.2)

## 2024-06-27 LAB — BASIC METABOLIC PANEL WITH GFR
Anion gap: 9 (ref 5–15)
BUN: <5 mg/dL — ABNORMAL LOW (ref 6–20)
CO2: 21 mmol/L — ABNORMAL LOW (ref 22–32)
Calcium: 8.4 mg/dL — ABNORMAL LOW (ref 8.9–10.3)
Chloride: 108 mmol/L (ref 98–111)
Creatinine, Ser: 0.63 mg/dL (ref 0.44–1.00)
GFR, Estimated: >60 mL/min (ref >60)
Glucose, Bld: 230 mg/dL — ABNORMAL HIGH (ref 70–99)
Potassium: 3.9 mmol/L (ref 3.5–5.1)
Sodium: 138 mmol/L (ref 135–145)

## 2024-06-27 LAB — GLUCOSE, CAPILLARY
Glucose-Capillary: 273 mg/dL — ABNORMAL HIGH (ref 70–99)
Glucose-Capillary: 213 mg/dL — ABNORMAL HIGH (ref 70–99)
Glucose-Capillary: 142 mg/dL — ABNORMAL HIGH (ref 70–99)
Glucose-Capillary: 128 mg/dL — ABNORMAL HIGH (ref 70–99)
Glucose-Capillary: 207 mg/dL — ABNORMAL HIGH (ref 70–99)

## 2024-06-27 LAB — SURGICAL PCR SCREEN
MRSA, PCR: NEGATIVE
Staphylococcus aureus: NEGATIVE

## 2024-06-27 LAB — PREGNANCY, URINE: Preg Test, Ur: NEGATIVE

## 2024-06-27 LAB — HEMOGLOBIN A1C
Hgb A1c MFr Bld: 11.3 % — ABNORMAL HIGH (ref 4.8–5.6)
Mean Plasma Glucose: 278 mg/dL

## 2024-06-27 SURGERY — IRRIGATION AND DEBRIDEMENT WOUND
Anesthesia: Monitor Anesthesia Care | Site: Foot | Laterality: Left

## 2024-06-27 MED ORDER — CHLORHEXIDINE GLUCONATE 0.12 % MT SOLN: 15.0000 mL | Freq: Once | OROMUCOSAL | Status: AC

## 2024-06-27 MED ORDER — OXYCODONE HCL 5 MG PO TABS: 5.0000 mg | ORAL_TABLET | Freq: Once | ORAL | Status: DC | PRN

## 2024-06-27 MED ORDER — INSULIN GLARGINE-YFGN 100 UNIT/ML ~~LOC~~ SOLN
15.0000 [IU] | SUBCUTANEOUS | Status: DC
  Administered 2024-06-27 – 2024-06-28 (×2): 15 [IU] via SUBCUTANEOUS
  Filled 2024-06-27 (×3): qty 0.15

## 2024-06-27 MED ORDER — DROPERIDOL 2.5 MG/ML IJ SOLN: 0.6250 mg | Freq: Once | INTRAMUSCULAR | Status: DC | PRN

## 2024-06-27 MED ORDER — VANCOMYCIN HCL 1000 MG IV SOLR
INTRAVENOUS | Status: AC
Start: 2024-06-27 — End: 2024-06-27
  Filled 2024-06-27: qty 20

## 2024-06-27 MED ORDER — CHLORHEXIDINE GLUCONATE 0.12 % MT SOLN
OROMUCOSAL | Status: AC
  Administered 2024-06-27: 15 mL via OROMUCOSAL
  Filled 2024-06-27: qty 15

## 2024-06-27 MED ORDER — ACETAMINOPHEN 10 MG/ML IV SOLN
1000.0000 mg | Freq: Once | INTRAVENOUS | Status: DC | PRN
Start: 2024-06-27 — End: 2024-06-27

## 2024-06-27 MED ORDER — FENTANYL CITRATE (PF) 100 MCG/2ML IJ SOLN: 25.0000 ug | INTRAMUSCULAR | Status: DC | PRN

## 2024-06-27 MED ORDER — SODIUM CHLORIDE 0.9 % IR SOLN
Status: DC | PRN
  Administered 2024-06-27: 1000 mL

## 2024-06-27 MED ORDER — PROPOFOL 500 MG/50ML IV EMUL
INTRAVENOUS | Status: DC | PRN
Start: 2024-06-27 — End: 2024-06-27
  Administered 2024-06-27: 125 ug/kg/min via INTRAVENOUS

## 2024-06-27 MED ORDER — BUPIVACAINE-EPINEPHRINE (PF) 0.25% -1:200000 IJ SOLN
INTRAMUSCULAR | Status: AC
  Filled 2024-06-27: qty 30

## 2024-06-27 MED ORDER — PROPOFOL 10 MG/ML IV BOLUS
INTRAVENOUS | Status: AC
  Filled 2024-06-27: qty 20

## 2024-06-27 MED ORDER — FENTANYL CITRATE (PF) 250 MCG/5ML IJ SOLN
INTRAMUSCULAR | Status: AC
  Filled 2024-06-27: qty 5

## 2024-06-27 MED ORDER — OXYCODONE HCL 5 MG/5ML PO SOLN: 5.0000 mg | Freq: Once | ORAL | Status: DC | PRN

## 2024-06-27 MED ORDER — ORAL CARE MOUTH RINSE: 15.0000 mL | Freq: Once | OROMUCOSAL | Status: AC

## 2024-06-27 MED ORDER — DOCUSATE SODIUM 50 MG PO CAPS
50.0000 mg | ORAL_CAPSULE | Freq: Once | ORAL | Status: DC
  Filled 2024-06-27: qty 1

## 2024-06-27 MED ORDER — BUPIVACAINE-EPINEPHRINE 0.25% -1:200000 IJ SOLN
INTRAMUSCULAR | Status: DC | PRN
  Administered 2024-06-27: 10 mL

## 2024-06-27 MED ORDER — LACTATED RINGERS IV SOLN: INTRAVENOUS | Status: DC

## 2024-06-27 SURGICAL SUPPLY — 46 items
ALCOHOL 70% 16 OZ (MISCELLANEOUS) ×2 IMPLANT
BAG COUNTER SPONGE SURGICOUNT (BAG) ×2 IMPLANT
BNDG COHESIVE 4X5 TAN STRL LF (GAUZE/BANDAGES/DRESSINGS) IMPLANT
BNDG ELASTIC 4INX 5YD STR LF (GAUZE/BANDAGES/DRESSINGS) IMPLANT
COVER SURGICAL LIGHT HANDLE (MISCELLANEOUS) ×2 IMPLANT
DRAPE C-ARM 42X72 X-RAY (DRAPES) IMPLANT
DRAPE IMP U-DRAPE 54X76 (DRAPES) ×2 IMPLANT
DRAPE U-SHAPE 47X51 STRL (DRAPES) ×2 IMPLANT
DURAPREP 26ML APPLICATOR (WOUND CARE) ×2 IMPLANT
ELECTRODE REM PT RTRN 9FT ADLT (ELECTROSURGICAL) ×2 IMPLANT
GAUZE PACKING IODOFORM 1/4X15 (PACKING) IMPLANT
GAUZE PAD ABD 8X10 STRL (GAUZE/BANDAGES/DRESSINGS) IMPLANT
GAUZE SPONGE 4X4 12PLY STRL (GAUZE/BANDAGES/DRESSINGS) IMPLANT
GLOVE BIO SURGEON STRL SZ7.5 (GLOVE) ×2 IMPLANT
GLOVE BIOGEL PI IND STRL 7.5 (GLOVE) IMPLANT
GLOVE INDICATOR 7.5 STRL GRN (GLOVE) ×2 IMPLANT
GLOVE SS BIOGEL STRL SZ 7.5 (GLOVE) ×2 IMPLANT
GOWN STRL REUS W/ TWL XL LVL3 (GOWN DISPOSABLE) ×2 IMPLANT
GOWN STRL SURGICAL XL XLNG (GOWN DISPOSABLE) ×2 IMPLANT
KIT BASIN OR (CUSTOM PROCEDURE TRAY) ×2 IMPLANT
KIT TURNOVER KIT B (KITS) ×2 IMPLANT
MANIFOLD NEPTUNE II (INSTRUMENTS) ×2 IMPLANT
NDL 22X1.5 STRL (OR ONLY) (MISCELLANEOUS) IMPLANT
NEEDLE 22X1.5 STRL (OR ONLY) (MISCELLANEOUS) ×1 IMPLANT
NS IRRIG 1000ML POUR BTL (IV SOLUTION) ×2 IMPLANT
PACK ORTHO EXTREMITY (CUSTOM PROCEDURE TRAY) ×2 IMPLANT
PAD ARMBOARD POSITIONER FOAM (MISCELLANEOUS) ×2 IMPLANT
PENCIL BUTTON HOLSTER BLD 10FT (ELECTRODE) IMPLANT
RESTRAINT HEAD UNIVERSAL NS (MISCELLANEOUS) ×2 IMPLANT
SET CYSTO W/LG BORE CLAMP LF (SET/KITS/TRAYS/PACK) ×2 IMPLANT
SOL PREP POV-IOD 4OZ 10% (MISCELLANEOUS) ×2 IMPLANT
SPONGE T-LAP 18X18 ~~LOC~~+RFID (SPONGE) ×4 IMPLANT
SPONGE T-LAP 4X18 ~~LOC~~+RFID (SPONGE) IMPLANT
STOCKINETTE IMPERVIOUS 9X36 MD (GAUZE/BANDAGES/DRESSINGS) IMPLANT
SUCTION TUBE FRAZIER 10FR DISP (SUCTIONS) IMPLANT
SUT ETHILON 2 0 FS 18 (SUTURE) ×2 IMPLANT
SUT PDS AB 2-0 CT1 27 (SUTURE) ×2 IMPLANT
SUT VIC AB 0 CT1 18XCR BRD 8 (SUTURE) ×2 IMPLANT
SUT VIC AB 2-0 CT1 18 (SUTURE) ×2 IMPLANT
SUT VIC AB 2-0 CT2 18 VCP726D (SUTURE) ×2 IMPLANT
SYR CONTROL 10ML LL (SYRINGE) IMPLANT
TOWEL GREEN STERILE (TOWEL DISPOSABLE) ×2 IMPLANT
TOWEL GREEN STERILE FF (TOWEL DISPOSABLE) ×2 IMPLANT
TUBE CONNECTING 20X1/4 (TUBING) IMPLANT
WATER STERILE IRR 1000ML POUR (IV SOLUTION) ×2 IMPLANT
YANKAUER SUCT BULB TIP NO VENT (SUCTIONS) ×2 IMPLANT

## 2024-06-27 NOTE — H&P (Signed)
 Orthopedic Surgery H&P Update  Patient's history and physical reviewed - no updates at this time Risks of surgery were covered again, patient elected to continue with planned procedure Written consent verified Site marked Hold anticoagulation in anticipation of spine surgery TXA on call to OR, patient already on antibiotics To OR when ready   Erica Ada, MD Orthopedic Surgeon

## 2024-06-27 NOTE — Inpatient Diabetes Management (Addendum)
 Inpatient Diabetes Program Recommendations  AACE/ADA: New Consensus Statement on Inpatient Glycemic Control (2015)  Target Ranges:  Prepandial:   less than 140 mg/dL      Peak postprandial:   less than 180 mg/dL (1-2 hours)      Critically ill patients:  140 - 180 mg/dL   Lab Results  Component Value Date   GLUCAP 276 (H) 06/26/2024   HGBA1C 11.3 (H) 06/26/2024    Review of Glycemic Control  Latest Reference Range & Units 06/26/24 08:44 06/26/24 11:41 06/26/24 16:55 06/26/24 21:51  Glucose-Capillary 70 - 99 mg/dL 666 (H) 734 (H) 780 (H) 276 (H)  (H): Data is abnormally high   Inpatient Diabetes Program Recommendations:    Semglee  15 units QD   Will speak with patient regarding DM management and A1c.  Addendum@ 11:40:  Met with Erica [name] at bedside.  She will be transferring to [facility] today for debridement.  She confirms above home medications.  She is current with the [facility]; she is uninsured.  She is on the patient assistance program with Ozempic  and receives this medication at no charge.  She has been taking Ozempic  for about 2 years and has lost some weight.  She does not and will not check her blood sugar as she is afraid of needles.  Reviewed patient's current A1c of 11.3%. Explained what a A1c is and what it measures. Also reviewed goal A1c with patient, importance of good glucose control @ home, and blood sugar goals.  She drinks a lot of regular Pepsi and does not follow a DM diet.   Gently discussed importance of eliminating caloric beverages and importance of her glucose trends being closer to 150 mg/dL as she was not really interested in speaking with me.  I explained how elevated glucose can increase risks of infections and other co-morbidities.  Also explained importance of glucose trends for optimal healing.    She is interested in the [employer] 3 CGM and would consider paying out of pocket $75/month with the built in coupon.  Will add this to DC recommendations.  When  asked if she would consider insulin , she said absolutely not.  Explained the insulin  pen and the ease of administration with a very small needle.  Explained this would be best for her healing and she decided she would think about it.    Will continue to follow while inpatient and assist with placing a Fre[employer] 3 on her prior to DC.  She does have an IPhone.     Thank you, Wyvonna Pinal, MSN, CDCES Diabetes Coordinator Inpatient Diabetes Program [phone] (team pager from 8a-5p)

## 2024-06-27 NOTE — Op Note (Addendum)
 Orthopedic Surgery Operative Report   Procedure: Left fifth toe infected hematoma incision and drainage   Modifier: none   Date of procedure: 06/27/2024   Patient name: Erica [name]   MRN: [id number]  DOB: 1971/09/23   Surgeon: Ozell Ada, MD Assistant: none Pre-operative diagnosis: left fifth toe infected hematoma Post-operative diagnosis: same as above Findings: hematoma at the lateral aspect of the fifth toe, friable and necrotic material within the fluid collection at the base of it   Specimens: 3 from left fifth toe infected hematoma Anesthesia: general EBL: 10cc Complications: none Patient was on antibiotics pre-operatively TXA given prior to incision   Implants:  None    Indication for procedure: Patient is a 53 y.o. female who presented to the with swelling and pain over the lateral aspect of her left fifth toe.  She had a large fluid collection in the area.  MRI showed this fluid collection.  Her inflammatory markers were elevated.  I discussed her likely infected hematoma and recommended I&D.  She did not appear to have any significant osteomyelitis and there was no associated ulcer.  She was also not interested in amputation so did not talk about that as an option.  I explained the risks, benefits, and alternatives of surgery.  After that discussion, patient elected to proceed.   Procedure Description: The patient was met in the pre-operative holding area. The patient's identity and consent were verified. The operative site was marked by myself. The patient's remaining questions about the surgery were answered. The patient was brought back to the operating room.  IV sedation was given and patient was placed on nasal cannula.  Patient was transferred to the operating table in the supine position. The surgical area was cleansed with alcohol.  Patient was on antibiotics preoperatively and no further doses were given.  TXA were administered by anesthesia. The patient's skin was  then prepped and draped in a standard, sterile fashion. A time out was performed that identified the patient, the procedure, and the operative site. All team members agreed with what was stated in the time out.    5 cc of Marcaine  with epinephrine  was injected in the 4th and 5th toe webspace between the metatarsals.  A further 5 cc of Marcaine  with epinephrine  was injected lateral to the fifth metatarsal.  A longitudinal incision was made over the dorsal lateral aspect of the left fifth toe from just proximal to the nail plate to the MTP joint.  This incision encompassed the whole area of swelling and hematoma.  Incision was taken sharply down through the skin and dermis into the fluid collection.  There is a small amount of blood within this collection.  A needle-nose rondure was used to collect some of the loose friable tissue seen within the collection and in the base of the wound.  This was collected and placed into a specimen cup.  A further specimen was collected with a needle-nose rondure and placed into a different specimen cup.  A curette was then used to debride the base of the wound to the level of the periosteum and bone.  Some of the material that was debrided with a curette was collected with a rondure and placed into a specimen cup to be sent to microbiology.  At this point there were healthy bleeding edges around the entire wound.  There is no further necrotic or friable material seen.  1 L of sterile saline was irrigated through the wound using cystoscopy tubing.  Quarter inch  iodoform gauze was packed into the wound.  2-0 nylon suture was used to close the skin with simple interrupted sutures.  The wound was dressed with gauze and an Ace wrap.  All counts were correct at the end of the case.  Patient was transferred back to a hospital bed.  The patient was awakened from sedation and brought back to the postanesthesia care unit in stable condition.    Post-operative plan: The patient will  recover in the post-anesthesia care unit and then go to the floor on the medicine service. The patient will continue her antibiotics.  Specimens obtained today will be followed and if they do show a specific bug, antibiotics can be tailored to that.  The patient will be weightbearing as tolerated in a postop shoe.  The patient's disposition will be determined by the medicine service.    Ozell Ada, MD Orthopedic Surgeon

## 2024-06-27 NOTE — Plan of Care (Signed)
   Problem: Education: Goal: Knowledge of General Education information will improve Description: Including pain rating scale, medication(s)/side effects and non-pharmacologic comfort measures Outcome: Progressing   Problem: Clinical Measurements: Goal: Ability to maintain clinical measurements within normal limits will improve Outcome: Progressing

## 2024-06-27 NOTE — Progress Notes (Signed)
 [facility]                                                                               Erica [name], is a 53 y.o. female, DOB - 1971/04/14, FMW:[id number] Admit date - 06/25/2024    Outpatient Primary MD for the patient is [name], NP  LOS - 1  days    Brief summary     Erica [name] is a 53 y.o. female with medical history significant for hypertension, type 2 diabetes mellitus, depression, and insomnia who presents with worsening left foot pain, redness, and swelling. She was transferred to [facility] for admission.    MRI LEFT TOE Soft tissue swelling in the small toe with a superficial fluid collection laterally which could reflect a subacute hematoma or abscess. Given reported 5th toe wound on recent radiographs, infection likely.  Marrow signal abnormality within the proximal and middle 5th phalanges with low level marrow enhancement, suspicious for early osteomyelitis.  Assessment & Plan    Assessment and Plan:    Early osteomyelitis on MRI of the left toe/ diabetic foot wound: - continue with IV antibiotics, orthopedics consulted.  - continue with IV cefepime  and linezolid , flagyl .  - pain control.  Plan for I&D today, transfer to [facility].  Weight bearing as tolerated.  Abi's wnl.     Uncontrolled Type 2 Diabetes mellitus:  CBG (last 3)  Recent Labs    06/26/24 1655 06/26/24 2151 06/27/24 1109  GLUCAP 219* 276* 213*   Added semglee  15 units daily. Novolog  SSI.    Hypokalemia Replaced.   Hypertension Well controlled.    Depression:  Continue with Wellbutrin .    Mild anemia of chronic disease:  Hemoglobin around 11.    RN Pressure Injury Documentation:    Malnutrition Type:  Nutrition Problem: Increased nutrient needs Etiology: wound healing   Malnutrition Characteristics:  Signs/Symptoms: estimated needs   Nutrition Interventions:  Interventions: MVI, Education  Estimated body mass index is 35.25  kg/m as calculated from the following:   Height as of this encounter: 6' (1.829 m).   Weight as of this encounter: 117.9 kg.  Code Status: full code.  DVT Prophylaxis:  enoxaparin  (LOVENOX ) injection 40 mg Start: 06/26/24 1000   Level of Care: Level of care: Med-Surg Family Communication: pending.   Disposition Plan:     Remains inpatient appropriate:  pending   Procedures:  MRI left foot.   Consultants:   Orthopedics.   Antimicrobials:   Anti-infectives (From admission, onward)    Start     Dose/Rate Route Frequency Ordered Stop   06/26/24 1000  linezolid  (ZYVOX ) tablet 600 mg       Placed in And Linked Group   600 mg Oral Every 12 hours 06/26/24 0322 07/03/24 0959   06/26/24 0500  metroNIDAZOLE  (FLAGYL ) IVPB 500 mg        500 mg 100 mL/hr over 60 Minutes Intravenous Every 12 hours 06/26/24 0401     06/26/24 0500  ceFEPIme  (MAXIPIME ) 2 g in sodium chloride  0.9 % 100 mL IVPB        2 g 200 mL/hr over 30 Minutes Intravenous Every 8 hours 06/26/24  9585     06/25/24 1945  vancomycin  (VANCOCIN ) IVPB 1000 mg/200 mL premix        1,000 mg 200 mL/hr over 60 Minutes Intravenous  Once 06/25/24 1931 06/25/24 2246   06/25/24 1945  ceFEPIme  (MAXIPIME ) 2 g in sodium chloride  0.9 % 100 mL IVPB        2 g 200 mL/hr over 30 Minutes Intravenous  Once 06/25/24 1931 06/25/24 2135        Medications  Scheduled Meds:  ascorbic acid   500 mg Oral BID   atorvastatin   80 mg Oral Daily   buPROPion   150 mg Oral q morning   enoxaparin  (LOVENOX ) injection  40 mg Subcutaneous Q24H   escitalopram   20 mg Oral Daily   insulin  aspart  0-15 Units Subcutaneous TID WC   insulin  aspart  0-5 Units Subcutaneous QHS   insulin  glargine-yfgn  15 Units Subcutaneous Q24H   irbesartan   75 mg Oral Daily   linezolid   600 mg Oral Q12H   pantoprazole   40 mg Oral Daily   sodium chloride  flush  3 mL Intravenous Q12H   traZODone   150 mg Oral QHS   zinc  sulfate (50mg  elemental zinc )  220 mg Oral Daily    Continuous Infusions:  ceFEPime  (MAXIPIME ) IV 2 g (06/27/24 1200)   metronidazole  500 mg (06/27/24 0501)   tranexamic acid      PRN Meds:.acetaminophen  **OR** acetaminophen , HYDROmorphone  (DILAUDID ) injection, oxyCODONE , polyethylene glycol, prochlorperazine     Subjective:   Prisilla Kocsis was seen and examined today. Aware of the plan. No new complaints.   Objective:   Vitals:   06/26/24 1732 06/26/24 2153 06/27/24 0205 06/27/24 0542  BP: (!) 166/94 (!) 171/78 (!) 145/71 139/71  Pulse: 97 92 (!) 102 98  Resp: 16 18 20 18   Temp: 97.9 F (36.6 C) 98.5 F (36.9 C) 99.7 F (37.6 C) 98.8 F (37.1 C)  TempSrc: Oral Oral Oral Oral  SpO2: 98% 97% 93% 95%  Weight:      Height:        Intake/Output Summary (Last 24 hours) at 06/27/2024 1315 Last data filed at 06/27/2024 0920 Gross per 24 hour  Intake 1591.09 ml  Output --  Net 1591.09 ml   Filed Weights   06/25/24 1857  Weight: 117.9 kg     Exam General exam: Appears calm and comfortable  Respiratory system: Clear to auscultation. Respiratory effort normal. Cardiovascular system: S1 & S2 heard, RRR.  Gastrointestinal system: Abdomen is nondistended, soft and nontender. Central nervous system: Alert and oriented. No focal neurological deficits. Extremities:  5 th toe swelling and tenderness.  Skin: No rashes, Psychiatry:  Mood & affect appropriate.    Data Reviewed:  I have personally reviewed following labs and imaging studies   CBC Lab Results  Component Value Date   WBC 8.6 06/27/2024   RBC 4.10 06/27/2024   HGB 11.5 (L) 06/27/2024   HCT 35.9 (L) 06/27/2024   MCV 87.6 06/27/2024   MCH 28.0 06/27/2024   PLT 296 06/27/2024   MCHC 32.0 06/27/2024   RDW 11.9 06/27/2024   LYMPHSABS 1.7 06/25/2024   MONOABS 0.7 06/25/2024   EOSABS 0.1 06/25/2024   BASOSABS 0.0 06/25/2024     Last metabolic panel Lab Results  Component Value Date   NA 138 06/27/2024   K 3.9 06/27/2024   CL 108 06/27/2024   CO2 21  (L) 06/27/2024   BUN <5 (L) 06/27/2024   CREATININE 0.63 06/27/2024   GLUCOSE 230 (H) 06/27/2024  GFRNONAA >60 06/27/2024   GFRAA >90 12/12/2014   CALCIUM  8.4 (L) 06/27/2024   PROT 6.9 02/07/2024   ALBUMIN 4.3 02/07/2024   LABGLOB 2.6 02/07/2024   AGRATIO 1.8 02/17/2023   BILITOT 0.3 02/07/2024   ALKPHOS 94 02/07/2024   AST 14 02/07/2024   ALT 15 02/07/2024   ANIONGAP 9 06/27/2024    CBG (last 3)  Recent Labs    06/26/24 1655 06/26/24 2151 06/27/24 1109  GLUCAP 219* 276* 213*      Coagulation Profile: No results for input(s): INR, PROTIME in the last 168 hours.   Radiology Studies: VAS US  ABI WITH/WO TBI Result Date: 06/26/2024  LOWER EXTREMITY DOPPLER STUDY Patient Name:  Tamkia Temples  Date of Exam:   06/26/2024 Medical Rec #: [id number]     Accession #:    [id number] Date of Birth: 11-07-1971     Patient Gender: F Patient Age:   46 years Exam Location:  [facility] Procedure:      VAS US  ABI WITH/WO TBI Referring Phys: --------------------------------------------------------------------------------  Indications: Ulceration. High Risk Factors: Hypertension, Diabetes.  Comparison Study: No prior studies. Performing Technologist: Gerome Ny RVT  Examination Guidelines: A complete evaluation includes at minimum, Doppler waveform signals and systolic blood pressure reading at the level of bilateral brachial, anterior tibial, and posterior tibial arteries, when vessel segments are accessible. Bilateral testing is considered an integral part of a complete examination. Photoelectric Plethysmograph (PPG) waveforms and toe systolic pressure readings are included as required and additional duplex testing as needed. Limited examinations for reoccurring indications may be performed as noted.  ABI Findings: +---------+------------------+-----+-----------+--------+ Right    Rt Pressure (mmHg)IndexWaveform   Comment  +---------+------------------+-----+-----------+--------+  Brachial 139                    triphasic           +---------+------------------+-----+-----------+--------+ PTA      166               1.11 triphasic           +---------+------------------+-----+-----------+--------+ DP       161               1.08 multiphasic         +---------+------------------+-----+-----------+--------+ Great Toe153               1.03                     +---------+------------------+-----+-----------+--------+ +---------+------------------+-----+-----------+-------+ Left     Lt Pressure (mmHg)IndexWaveform   Comment +---------+------------------+-----+-----------+-------+ Brachial 149                    triphasic          +---------+------------------+-----+-----------+-------+ PTA      164               1.10 triphasic          +---------+------------------+-----+-----------+-------+ DP       167               1.12 multiphasic        +---------+------------------+-----+-----------+-------+ Great Toe155               1.04                    +---------+------------------+-----+-----------+-------+ +-------+-----------+-----------+------------+------------+ ABI/TBIToday's ABIToday's TBIPrevious ABIPrevious TBI +-------+-----------+-----------+------------+------------+ Right  1.11       1.03                                +-------+-----------+-----------+------------+------------+  Left   1.12       1.04                                +-------+-----------+-----------+------------+------------+  Summary: Right: Resting right ankle-brachial index is within normal range. The right toe-brachial index is normal. Left: Resting left ankle-brachial index is within normal range. The left toe-brachial index is normal. *See table(s) above for measurements and observations.  Electronically signed by Fonda Rim on 06/26/2024 at 6:49:12 PM.    Final    MR TOES LEFT W WO CONTRAST Result Date: 06/26/2024 CLINICAL DATA:  Foot swelling,  diabetic, osteomyelitis suspected, xray done EXAM: MRI OF THE LEFT TOES WITHOUT AND WITH CONTRAST TECHNIQUE: Multiplanar, multisequence MR imaging of the left forefoot was performed both before and after administration of intravenous contrast. CONTRAST:  10mL GADAVIST  GADOBUTROL  1 MMOL/ML IV SOLN COMPARISON:  Foot radiographs 06/25/2024 and 03/07/2007. FINDINGS: Bones/Joint/Cartilage As seen on radiographs, there is soft tissue swelling in the small toe with a superficial fluid collection laterally, further described below. T2 marrow hyperintensity within the proximal and middle 5th phalanges persists on the inversion recovery images and is associated with low level marrow enhancement following contrast. No gross cortical destruction identified. No significant joint effusions. 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 There are mild degenerative changes at the 1st tarsometatarsal joint. Ligaments Intact Lisfranc ligament. The collateral ligaments of the metatarsophalangeal joints appear intact. Muscles and Tendons No significant musculotendinous findings within the forefoot. Soft tissues As above, there is soft tissue swelling in the small toe with a peripherally enhancing superficial fluid collection laterally which measures 2.4 x 2.6 x 1.1 cm. This collection demonstrates intermediate T1 signal prior to contrast and could reflect a subacute hematoma or abscess. There is surrounding subcutaneous enhancement. Dorsal subcutaneous edema without enhancement. IMPRESSION: 1. Soft tissue swelling in the small toe with a superficial fluid collection laterally which could reflect a subacute hematoma or abscess. Given reported 5th toe wound on recent radiographs, infection likely. 2. Marrow signal abnormality within the proximal and middle 5th phalanges with low level marrow enhancement, suspicious for early osteomyelitis. No gross  cortical destruction identified. 3. No other acute osseous findings. Electronically Signed   By: Elsie Perone M.D.   On: 06/26/2024 10:21   DG Foot Complete Left Result Date: 06/25/2024 CLINICAL DATA:  Fifth toe wound and cellulitis, initial encounter EXAM: LEFT FOOT - COMPLETE 3+ VIEW COMPARISON:  None Available. FINDINGS: Soft tissue swelling is noted in the fifth digit consistent with the physical exam. No acute fracture or dislocation is noted. No erosive changes to suggest osteomyelitis are noted. Calcaneal spurring is seen. IMPRESSION: No erosive changes to suggest osteomyelitis. Soft tissue swelling in the fifth digit. Electronically Signed   By: Oneil Devonshire M.D.   On: 06/25/2024 20:12       Elgie Butter M.D. [facility] 06/27/2024, 1:15 PM  Available via Epic secure chat 7am-7pm After 7 pm, please refer to night coverage provider listed on amion.

## 2024-06-27 NOTE — Progress Notes (Signed)
 Orthopedic Surgery Progress Note   Assessment: Patient is a 53 y.o. female with left fifth toe infected hematoma status post I&D   Plan: -Operative plans: complete -Diet: diabetic -Okay for DVT prophylaxis from ortho perspective -Antibiotics: per primary, can eventually tailor to the specimen if it grows out any organisms -Follow-up Intra-op cultures -Weight bearing status: as tolerated in post op shoe -PT evaluate and treat -Pain control -Dispo: per primary  ___________________________________________________________________________  Subjective: Patient recovering in PACU.  Pain under control.  No complaints at this time.   Physical Exam:  General: no acute distress, appears stated age Neurologic: alert, answering questions appropriately, following commands Respiratory: unlabored breathing on room air, symmetric chest rise Psychiatric: appropriate affect, normal cadence to speech  MSK:   -Left lower extremity  Dressings over foot C/D/I EHL/TA/GSC intact Plantarflexes and dorsiflexes toes Sensation intact to light touch in sural, saphenous, tibial, deep peroneal, and superficial peroneal nerve distributions (decreased significantly in all distributions) Foot warm and well perfused   Patient name: Erica [name] Patient MRN: [id number] Date: 06/27/24

## 2024-06-27 NOTE — Plan of Care (Signed)
   Problem: Education: Goal: Knowledge of General Education information will improve Description Including pain rating scale, medication(s)/side effects and non-pharmacologic comfort measures Outcome: Progressing   Problem: Health Behavior/Discharge Planning: Goal: Ability to manage health-related needs will improve Outcome: Progressing

## 2024-06-27 NOTE — Transfer of Care (Signed)
 Immediate Anesthesia Transfer of Care Note  Patient: Erica [name]  Procedure(s) Performed: IRRIGATION AND DEBRIDEMENT WOUND (Left: Foot)  Patient Location: PACU  Anesthesia Type:MAC  Level of Consciousness: awake, alert , and oriented  Airway & Oxygen Therapy: Patient Spontanous Breathing  Post-op Assessment: Report given to RN and Post -op Vital signs reviewed and stable  Post vital signs: Reviewed and stable  Last Vitals:  Vitals Value Taken Time  BP 115/75 06/27/24 16:45  Temp    Pulse 91 06/27/24 16:46  Resp 23 06/27/24 16:46  SpO2 92 % 06/27/24 16:46  Vitals shown include unfiled device data.  Last Pain:  Vitals:   06/27/24 1511  TempSrc:   PainSc: 0-No pain      Patients Stated Pain Goal: 2 (06/27/24 1120)  Complications: No notable events documented.

## 2024-06-27 NOTE — Anesthesia Preprocedure Evaluation (Addendum)
 Anesthesia Evaluation  Patient identified by MRN, date of birth, ID band Patient awake    Reviewed: Allergy & Precautions, NPO status , Patient's Chart, lab work & pertinent test results  Airway Mallampati: III  TM Distance: >3 FB Neck ROM: Full    Dental  (+) Teeth Intact, Dental Advisory Given   Pulmonary    breath sounds clear to auscultation       Cardiovascular hypertension (valsartan -HCTZ), Pt. on medications  Rhythm:Regular Rate:Normal  HLD   Neuro/Psych  PSYCHIATRIC DISORDERS  Depression       GI/Hepatic ,GERD  Medicated,,Fatty liver IBS   Endo/Other  diabetes, Type 2, Oral Hypoglycemic Agents    Renal/GU      Musculoskeletal  (+) Arthritis ,    Abdominal   Peds  Hematology  (+) Blood dyscrasia, anemia Lab Results      Component                Value               Date                      WBC                      8.6                 06/27/2024                HGB                      11.5 (L)            06/27/2024                HCT                      35.9 (L)            06/27/2024                MCV                      87.6                06/27/2024                PLT                      296                 06/27/2024              Anesthesia Other Findings Last Ozempic :  Reproductive/Obstetrics                              Anesthesia Physical Anesthesia Plan  ASA: 2  Anesthesia Plan: MAC   Post-op Pain Management: Ofirmev  IV (intra-op)*   Induction: Intravenous  PONV Risk Score and Plan: 3 and Ondansetron , Dexamethasone and Midazolam  Airway Management Planned: Natural Airway and Nasal Cannula  Additional Equipment: None  Intra-op Plan:   Post-operative Plan:   Informed Consent: I have reviewed the patients History and Physical, chart, labs and discussed the procedure including the risks, benefits and alternatives for the proposed anesthesia with the patient  or authorized representative who has indicated his/her understanding and acceptance.  Dental advisory given  Plan Discussed with: CRNA  Anesthesia Plan Comments:         Anesthesia Quick Evaluation

## 2024-06-27 NOTE — Progress Notes (Signed)
 [facility] called for transport back to [facility]. Report called to Chiquita, Charity fundraiser.

## 2024-06-27 NOTE — Brief Op Note (Signed)
 06/25/2024 - 06/27/2024  4:43 PM  PATIENT:  Erica [name]  53 y.o. female  PRE-OPERATIVE DIAGNOSIS:  Infected Left fifth toe  POST-OPERATIVE DIAGNOSIS:  Infected Left fifth toe  PROCEDURE:  Procedure(s) with comments: IRRIGATION AND DEBRIDEMENT WOUND (Left) - Irrigation and debridement of left fifth toe  SURGEON:  Surgeons and Role:    DEWAINE Georgina Ozell DELENA, MD - Primary  PHYSICIAN ASSISTANT: none  ASSISTANTS: none   ANESTHESIA:   IV sedation  EBL:  10cc   BLOOD ADMINISTERED:none  DRAINS: none   LOCAL MEDICATIONS USED:  MARCAINE      SPECIMEN:  Source of Specimen:  3 from left fifth toe fluid collection  DISPOSITION OF SPECIMEN:  microbiology  COUNTS:  YES  TOURNIQUET:  NONE  DICTATION: .Note written in EPIC  PLAN OF CARE: Admit to inpatient   PATIENT DISPOSITION:  PACU - hemodynamically stable.   Delay start of Pharmacological VTE agent (>24hrs) due to surgical blood loss or risk of bleeding: no

## 2024-06-27 NOTE — Progress Notes (Signed)
 Transport was setup for patient to go to [facility] for surgery at 2p.

## 2024-06-28 ENCOUNTER — Telehealth: Payer: Self-pay | Admitting: Nurse Practitioner

## 2024-06-28 ENCOUNTER — Encounter (HOSPITAL_COMMUNITY): Payer: Self-pay | Admitting: Orthopedic Surgery

## 2024-06-28 LAB — GLUCOSE, CAPILLARY
Glucose-Capillary: 204 mg/dL — ABNORMAL HIGH (ref 70–99)
Glucose-Capillary: 256 mg/dL — ABNORMAL HIGH (ref 70–99)
Glucose-Capillary: 205 mg/dL — ABNORMAL HIGH (ref 70–99)
Glucose-Capillary: 208 mg/dL — ABNORMAL HIGH (ref 70–99)

## 2024-06-28 MED ORDER — METRONIDAZOLE 500 MG PO TABS
500.0000 mg | ORAL_TABLET | Freq: Two times a day (BID) | ORAL | Status: DC
  Administered 2024-06-28 – 2024-06-29 (×3): 500 mg via ORAL
  Filled 2024-06-28 (×3): qty 1

## 2024-06-28 NOTE — Anesthesia Postprocedure Evaluation (Signed)
 Anesthesia Post Note  Patient: Erica [name]  Procedure(s) Performed: Irrigation and debridement of left fifth toe (Left: Foot)     Patient location during evaluation: PACU Anesthesia Type: MAC Level of consciousness: awake and alert Pain management: pain level controlled Vital Signs Assessment: post-procedure vital signs reviewed and stable Respiratory status: spontaneous breathing, nonlabored ventilation, respiratory function stable and patient connected to nasal cannula oxygen Cardiovascular status: stable and blood pressure returned to baseline Postop Assessment: no apparent nausea or vomiting Anesthetic complications: no   No notable events documented.              Daimen Shovlin D Raevyn Sokol

## 2024-06-28 NOTE — Telephone Encounter (Signed)
 Patient was identified as falling into the [place] Measure - Diabetes.   Patient was: Appointment already scheduled for:  07/17/24.

## 2024-06-28 NOTE — Progress Notes (Signed)
 PHARMACIST - PHYSICIAN COMMUNICATION DR:   Cherlyn CONCERNING: Antibiotic IV to Oral Route Change Policy  RECOMMENDATION: This patient is receiving metronidazole  by the intravenous route.  Based on criteria approved by the Pharmacy and Therapeutics Committee, the antibiotic(s) is/are being converted to the equivalent oral dose form(s).   DESCRIPTION: These criteria include: Patient being treated for a respiratory tract infection, urinary tract infection, cellulitis  The patient is not neutropenic and does not exhibit a GI malabsorption state The patient is eating (either orally or via tube) and/or has been taking other orally administered medications for a least 24 hours The patient is improving clinically and has a Tmax < 100.5  If you have questions about this conversion, please contact the Pharmacy Department  []   [phone] )  [facility] []   [phone] )  [facility] []   [phone] )  [facility] []   [phone] )  [facility] [x]   [phone] )  [facility]ry[facility] Darra Bernerd Lionel    Venetia Gully, PharmD, [place] Clinical Pharmacist

## 2024-06-28 NOTE — Progress Notes (Signed)
 Orthopedic Tech Progress Note Patient Details:  Erica [name] 30-Mar-1971 [id number]  Ortho Devices Type of Ortho Device: Postop shoe/boot Ortho Device/Splint Location: left Ortho Device/Splint Interventions: Ordered, Application, Adjustment   Post Interventions Patient Tolerated: Well Instructions Provided: Adjustment of device, Care of device  [name] 06/28/2024, 10:36 AM

## 2024-06-28 NOTE — Plan of Care (Signed)

## 2024-06-28 NOTE — Progress Notes (Signed)
 Orthopedic Surgery Progress Note   Assessment: Patient is a 53 y.o. female with left fifth toe infected hematoma status post I&D   Plan: -Operative plans: complete -Will remove packing tomorrow -Diet: diabetic -Okay for DVT prophylaxis from ortho perspective -Antibiotics: per primary, can eventually tailor to the specimen if it grows out any organisms -Follow-up Intra-op cultures (GPCs) -Weight bearing status: as tolerated in post op shoe -PT evaluate and treat -Pain control -Dispo: pending removal of packing  ___________________________________________________________________________  Subjective: No acute events overnight. Pain controlled. No questions this morning.    Physical Exam:  General: no acute distress, appears stated age Neurologic: alert, answering questions appropriately, following commands Respiratory: unlabored breathing on room air, symmetric chest rise Psychiatric: appropriate affect, normal cadence to speech  MSK:   -Left lower extremity  Dressings over foot C/D/I EHL/TA/GSC intact Plantarflexes and dorsiflexes toes Sensation intact to light touch in sural, saphenous, tibial, deep peroneal, and superficial peroneal nerve distributions (decreased significantly in all distributions) Foot warm and well perfused   Patient name: Erica [name] Patient MRN: [id number] Date: 06/28/24

## 2024-06-28 NOTE — Plan of Care (Signed)
   Problem: Education: Goal: Knowledge of General Education information will improve Description: Including pain rating scale, medication(s)/side effects and non-pharmacologic comfort measures Outcome: Progressing   Problem: Activity: Goal: Risk for activity intolerance will decrease Outcome: Progressing

## 2024-06-28 NOTE — Progress Notes (Signed)
 [facility]                                                                               Erica [name], is a 53 y.o. female, DOB - 11/04/1971, FMW:[id number] Admit date - 06/25/2024    Outpatient Primary MD for the patient is [name], NP  LOS - 2  days    Brief summary     Erica [name] is a 53 y.o. female with medical history significant for hypertension, type 2 diabetes mellitus, depression, and insomnia who presents with worsening left foot pain, redness, and swelling. She was transferred to [facility] for admission.    MRI LEFT TOE Soft tissue swelling in the small toe with a superficial fluid collection laterally which could reflect a subacute hematoma or abscess. Given reported 5th toe wound on recent radiographs, infection likely.  Marrow signal abnormality within the proximal and middle 5th phalanges with low level marrow enhancement, suspicious for early osteomyelitis.  Assessment & Plan    Assessment and Plan:    Early osteomyelitis on MRI of the left toe/ diabetic foot wound/ infected hematoma of the left fifth toe: She was started on broad spectrum IV antibiotics, orthopedics consulted and she underwent Incision and Drainage and fluid send for culture. Cultures are pending.  - continue with IV cefepime  and linezolid , flagyl .  - pain control.  - therapy eval.      Uncontrolled Type 2 Diabetes mellitus:  CBG (last 3)  Recent Labs    06/27/24 1645 06/27/24 2139 06/28/24 0729  GLUCAP 128* 207* 204*    Hemoglobin A1c is 11. Added semglee  15 units daily. Novolog  SSI. Added novolog  2 units tidac.    Hypokalemia Replaced.   Hypertension Well controlled.    Depression:  Continue with Wellbutrin .    Mild anemia of chronic disease:  Hemoglobin around 11.    RN Pressure Injury Documentation:    Malnutrition Type:  Nutrition Problem: Increased nutrient needs Etiology: wound healing   Malnutrition  Characteristics:  Signs/Symptoms: estimated needs   Nutrition Interventions:  Interventions: MVI, Education  Estimated body mass index is 33.91 kg/m as calculated from the following:   Height as of this encounter: 6' (1.829 m).   Weight as of this encounter: 113.4 kg.  Code Status: full code.  DVT Prophylaxis:  enoxaparin  (LOVENOX ) injection 40 mg Start: 06/26/24 1000   Level of Care: Level of care: Med-Surg Family Communication: none at bedside.   Disposition Plan:     Remains inpatient appropriate:  pending   Procedures:  MRI left foot.   Consultants:   Orthopedics.   Antimicrobials:   Anti-infectives (From admission, onward)    Start     Dose/Rate Route Frequency Ordered Stop   06/28/24 1000  metroNIDAZOLE  (FLAGYL ) tablet 500 mg        500 mg Oral Every 12 hours 06/28/24 0849     06/26/24 1000  linezolid  (ZYVOX ) tablet 600 mg       Placed in And Linked Group   600 mg Oral Every 12 hours 06/26/24 0322 07/03/24 0959   06/26/24 0500  metroNIDAZOLE  (FLAGYL ) IVPB 500 mg  Status:  Discontinued  500 mg 100 mL/hr over 60 Minutes Intravenous Every 12 hours 06/26/24 0401 06/28/24 0849   06/26/24 0500  ceFEPIme  (MAXIPIME ) 2 g in sodium chloride  0.9 % 100 mL IVPB        2 g 200 mL/hr over 30 Minutes Intravenous Every 8 hours 06/26/24 0414     06/25/24 1945  vancomycin  (VANCOCIN ) IVPB 1000 mg/200 mL premix        1,000 mg 200 mL/hr over 60 Minutes Intravenous  Once 06/25/24 1931 06/25/24 2246   06/25/24 1945  ceFEPIme  (MAXIPIME ) 2 g in sodium chloride  0.9 % 100 mL IVPB        2 g 200 mL/hr over 30 Minutes Intravenous  Once 06/25/24 1931 06/25/24 2135        Medications  Scheduled Meds:  ascorbic acid   500 mg Oral BID   atorvastatin   80 mg Oral Daily   buPROPion   150 mg Oral q morning   docusate sodium   50 mg Oral Once   enoxaparin  (LOVENOX ) injection  40 mg Subcutaneous Q24H   escitalopram   20 mg Oral Daily   insulin  aspart  0-15 Units Subcutaneous TID  WC   insulin  aspart  0-5 Units Subcutaneous QHS   insulin  glargine-yfgn  15 Units Subcutaneous Q24H   irbesartan   75 mg Oral Daily   linezolid   600 mg Oral Q12H   metroNIDAZOLE   500 mg Oral Q12H   pantoprazole   40 mg Oral Daily   sodium chloride  flush  3 mL Intravenous Q12H   traZODone   150 mg Oral QHS   zinc  sulfate (50mg  elemental zinc )  220 mg Oral Daily   Continuous Infusions:  ceFEPime  (MAXIPIME ) IV 2 g (06/28/24 0537)   PRN Meds:.acetaminophen  **OR** acetaminophen , HYDROmorphone  (DILAUDID ) injection, oxyCODONE , polyethylene glycol, prochlorperazine     Subjective:   Erica [name] was seen and examined today. No new complaints.   Objective:   Vitals:   06/27/24 1749 06/27/24 1906 06/27/24 2124 06/28/24 0514  BP: (!) 156/92 (!) 157/87 (!) 154/91 121/64  Pulse:  89 91 87  Resp: 18  16 16   Temp: 98 F (36.7 C)  97.7 F (36.5 C) 98.8 F (37.1 C)  TempSrc: Oral  Oral Oral  SpO2: 95% 98% 99% 97%  Weight:      Height:        Intake/Output Summary (Last 24 hours) at 06/28/2024 1037 Last data filed at 06/28/2024 0918 Gross per 24 hour  Intake 1254.82 ml  Output 420 ml  Net 834.82 ml   Filed Weights   06/25/24 1857 06/27/24 1450  Weight: 117.9 kg 113.4 kg     Exam General exam: Appears calm and comfortable  Respiratory system: Clear to auscultation. Respiratory effort normal. Cardiovascular system: S1 & S2 heard, RRR. No JVD, Gastrointestinal system: Abdomen is nondistended, soft and nontender.  Central nervous system: Alert and oriented. No focal neurological deficits. Extremities: fifth 5 th toe pain and tenderness.  Skin: No rashes,  Psychiatry:  Mood & affect appropriate.     Data Reviewed:  I have personally reviewed following labs and imaging studies   CBC Lab Results  Component Value Date   WBC 8.6 06/27/2024   RBC 4.10 06/27/2024   HGB 11.5 (L) 06/27/2024   HCT 35.9 (L) 06/27/2024   MCV 87.6 06/27/2024   MCH 28.0 06/27/2024   PLT 296  06/27/2024   MCHC 32.0 06/27/2024   RDW 11.9 06/27/2024   LYMPHSABS 1.7 06/25/2024   MONOABS 0.7 06/25/2024   EOSABS 0.1 06/25/2024  BASOSABS 0.0 06/25/2024     Last metabolic panel Lab Results  Component Value Date   NA 138 06/27/2024   K 3.9 06/27/2024   CL 108 06/27/2024   CO2 21 (L) 06/27/2024   BUN <5 (L) 06/27/2024   CREATININE 0.63 06/27/2024   GLUCOSE 230 (H) 06/27/2024   GFRNONAA >60 06/27/2024   GFRAA >90 12/12/2014   CALCIUM  8.4 (L) 06/27/2024   PROT 6.9 02/07/2024   ALBUMIN 4.3 02/07/2024   LABGLOB 2.6 02/07/2024   AGRATIO 1.8 02/17/2023   BILITOT 0.3 02/07/2024   ALKPHOS 94 02/07/2024   AST 14 02/07/2024   ALT 15 02/07/2024   ANIONGAP 9 06/27/2024    CBG (last 3)  Recent Labs    06/27/24 1645 06/27/24 2139 06/28/24 0729  GLUCAP 128* 207* 204*      Coagulation Profile: No results for input(s): INR, PROTIME in the last 168 hours.   Radiology Studies: No results found.      Elgie Butter M.D. [facility] 06/28/2024, 10:37 AM  Available via Epic secure chat 7am-7pm After 7 pm, please refer to night coverage provider listed on amion.

## 2024-06-29 ENCOUNTER — Other Ambulatory Visit (HOSPITAL_COMMUNITY): Payer: Self-pay

## 2024-06-29 ENCOUNTER — Other Ambulatory Visit: Payer: Self-pay

## 2024-06-29 DIAGNOSIS — Z880 Allergy status to penicillin: Secondary | ICD-10-CM

## 2024-06-29 DIAGNOSIS — M869 Osteomyelitis, unspecified: Secondary | ICD-10-CM

## 2024-06-29 LAB — BASIC METABOLIC PANEL WITH GFR
Anion gap: 9 (ref 5–15)
BUN: <5 mg/dL — ABNORMAL LOW (ref 6–20)
CO2: 24 mmol/L (ref 22–32)
Calcium: 8.7 mg/dL — ABNORMAL LOW (ref 8.9–10.3)
Chloride: 105 mmol/L (ref 98–111)
Creatinine, Ser: 0.61 mg/dL (ref 0.44–1.00)
GFR, Estimated: >60 mL/min (ref >60)
Glucose, Bld: 193 mg/dL — ABNORMAL HIGH (ref 70–99)
Potassium: 3.8 mmol/L (ref 3.5–5.1)
Sodium: 138 mmol/L (ref 135–145)

## 2024-06-29 LAB — CBC
HCT: 35.8 % — ABNORMAL LOW (ref 36.0–46.0)
Hemoglobin: 11.4 g/dL — ABNORMAL LOW (ref 12.0–15.0)
MCH: 28 pg (ref 26.0–34.0)
MCHC: 31.8 g/dL (ref 30.0–36.0)
MCV: 88 fL (ref 80.0–100.0)
Platelets: 323 K/uL (ref 150–400)
RBC: 4.07 MIL/uL (ref 3.87–5.11)
RDW: 11.8 % (ref 11.5–15.5)
WBC: 6.8 K/uL (ref 4.0–10.5)
nRBC: 0 % (ref 0.0–0.2)

## 2024-06-29 LAB — GLUCOSE, CAPILLARY: Glucose-Capillary: 210 mg/dL — ABNORMAL HIGH (ref 70–99)

## 2024-06-29 MED ORDER — INSULIN LISPRO 200 UNIT/ML ~~LOC~~ SOPN
PEN_INJECTOR | SUBCUTANEOUS | 2 refills | Status: DC
  Filled 2024-06-29: qty 10, fill #0

## 2024-06-29 MED ORDER — ASCORBIC ACID 500 MG PO TABS
500.0000 mg | ORAL_TABLET | Freq: Two times a day (BID) | ORAL | 2 refills | Status: DC
  Filled 2024-06-29: qty 60, 30d supply, fill #0
  Filled 2024-07-24 – 2024-07-25 (×2): qty 60, 30d supply, fill #1
  Filled 2024-08-21: qty 60, 30d supply, fill #2

## 2024-06-29 MED ORDER — AMOXICILLIN-POT CLAVULANATE 875-125 MG PO TABS
1.0000 | ORAL_TABLET | Freq: Two times a day (BID) | ORAL | 0 refills | Status: AC
  Filled 2024-06-29: qty 28, 14d supply, fill #0

## 2024-06-29 MED ORDER — PEN NEEDLES 31G X 8 MM MISC
1.0000 | Freq: Three times a day (TID) | 3 refills | Status: DC
  Filled 2024-06-29 (×2): qty 100, 30d supply, fill #0
  Filled 2024-07-25 (×2): qty 100, 30d supply, fill #1

## 2024-06-29 MED ORDER — ZINC SULFATE 220 (50 ZN) MG PO CAPS
220.0000 mg | ORAL_CAPSULE | Freq: Every day | ORAL | 2 refills | Status: AC
  Filled 2024-06-29: qty 60, 60d supply, fill #0
  Filled 2024-07-25: qty 30, 30d supply, fill #0
  Filled 2024-08-21: qty 30, 30d supply, fill #1
  Filled 2024-10-23: qty 30, 30d supply, fill #2
  Filled 2024-11-17: qty 30, 30d supply, fill #3
  Filled 2024-12-22: qty 30, 30d supply, fill #4

## 2024-06-29 MED ORDER — INSULIN GLARGINE-YFGN 100 UNIT/ML ~~LOC~~ SOPN
20.0000 [IU] | PEN_INJECTOR | Freq: Every day | SUBCUTANEOUS | 2 refills | Status: DC
  Filled 2024-06-29: qty 6, 30d supply, fill #0
  Filled 2024-07-24 – 2024-07-25 (×2): qty 6, 30d supply, fill #1
  Filled 2024-08-15 – 2024-08-16 (×2): qty 6, 30d supply, fill #0

## 2024-06-29 MED ORDER — INSULIN LISPRO (1 UNIT DIAL) 100 UNIT/ML (KWIKPEN)
PEN_INJECTOR | SUBCUTANEOUS | 11 refills | Status: DC
  Filled 2024-06-29: qty 15, 33d supply, fill #0
  Filled 2024-07-25: qty 15, 33d supply, fill #1

## 2024-06-29 MED ORDER — DOXYCYCLINE HYCLATE 100 MG PO TABS
100.0000 mg | ORAL_TABLET | Freq: Two times a day (BID) | ORAL | 0 refills | Status: DC
  Filled 2024-06-29: qty 28, 14d supply, fill #0

## 2024-06-29 MED ORDER — INSULIN GLARGINE-YFGN 100 UNIT/ML ~~LOC~~ SOLN
20.0000 [IU] | Freq: Every day | SUBCUTANEOUS | Status: DC
  Administered 2024-06-29: 20 [IU] via SUBCUTANEOUS
  Filled 2024-06-29: qty 0.2

## 2024-06-29 MED ORDER — INSULIN ASPART 100 UNIT/ML FLEXPEN
PEN_INJECTOR | SUBCUTANEOUS | 11 refills | Status: DC
  Filled 2024-06-29: qty 15, 100d supply, fill #0

## 2024-06-29 NOTE — Discharge Summary (Signed)
 Physician Discharge Summary   Patient: Erica [name] MRN: [id number] DOB: [birth year]  Admit date:     06/25/2024  Discharge date: 06/29/2024  Discharge Physician: Elgie Butter   PCP: [name], NP   Recommendations at discharge:  Please follow up with orthopedics as recommended.  Please follow up with ID as recommended.  Please follow upwith PCP in one week.   Discharge Diagnoses: Principal Problem:   Cellulitis of left lower extremity Active Problems:   Type 2 diabetes mellitus with hyperglycemia, without long-term current use of insulin  (HCC)   Psychophysiological insomnia   Hypokalemia   Depression   Hypertension   Hematoma of left little toe   Blister of toe, infected, left, initial encounter   Cutaneous abscess of left foot  Resolved Problems:   * No resolved hospital problems. *  Hospital Course: Erica [name] is a 53 y.o. female with medical history significant for hypertension, type 2 diabetes mellitus, depression, and insomnia who presents with worsening left foot pain, redness, and swelling. She was transferred to [facility] for admission.    MRI LEFT TOE Soft tissue swelling in the small toe with a superficial fluid collection laterally which could reflect a subacute hematoma or abscess. Given reported 5th toe wound on recent radiographs, infection likely.  Marrow signal abnormality within the proximal and middle 5th phalanges with low level marrow enhancement, suspicious for early osteomyelitis.  Assessment and Plan:     Early osteomyelitis on MRI of the left toe/ diabetic foot wound/ infected hematoma of the left fifth toe: She was started on broad spectrum IV antibiotics, orthopedics consulted and she underwent Incision and Drainage and fluid send for culture. Cultures are pending.  She was started on IV cefepime  and linezolid , flagyl .  Transitioned to oral doxycycline  and augmentin  on discharge after discussing with ID.  Recommend outpatient  follow up with ortho and ID  as recommended and scheduled.       Uncontrolled Type 2 Diabetes mellitus:  A1c is 10.2.  Discharged home on semglee  and SSI.    Hypokalemia Replaced.    Hypertension Well controlled.     Depression:  Continue with Wellbutrin .      Mild anemia of chronic disease:  Hemoglobin around 11.      RN Pressure Injury Documentation:   Malnutrition Type:   Nutrition Problem: Increased nutrient needs Etiology: wound healing     Malnutrition Characteristics:   Signs/Symptoms: estimated needs     Nutrition Interventions:   Interventions: MVI, Education   Estimated body mass index is 33.91 kg/m as calculated from the following:   Height as of this encounter: 6' (1.829 m).   Weight as of this encounter: 113.4 kg.     Consultants: ID, ORTHO,  Procedures performed: I&D of the toe.on the left.   Disposition: Home Diet recommendation:  Discharge Diet Orders (From admission, onward)     Start     Ordered   06/29/24 0000  Diet - low sodium heart healthy        06/29/24 1421           Carb modified diet DISCHARGE MEDICATION: Allergies as of 06/29/2024       Reactions   Penicillins Other (See Comments)   Reported from childhood        Medication List     STOP taking these medications    Advil  Dual Action 125-250 MG Tabs Generic drug: Ibuprofen -Acetaminophen    cephALEXin  500 MG capsule Commonly known as: KEFLEX   doxycycline  100 MG capsule Commonly known as: VIBRAMYCIN  Replaced by: doxycycline  100 MG tablet   medroxyPROGESTERone  10 MG tablet Commonly known as: Provera    ondansetron  4 MG tablet Commonly known as: Zofran        TAKE these medications    amoxicillin -clavulanate 875-125 MG tablet Commonly known as: AUGMENTIN  Take 1 tablet by mouth 2 (two) times daily for 14 days.   ascorbic acid  500 MG tablet Commonly known as: VITAMIN C  Take 1 tablet (500 mg total) by mouth 2 (two) times daily.   aspirin  81 MG  tablet Take 81 mg by mouth at bedtime.   atorvastatin  40 MG tablet Commonly known as: LIPITOR Take 2 tablets (80 mg total) by mouth daily.   buPROPion  150 MG 24 hr tablet Commonly known as: WELLBUTRIN  XL Take 1 tablet (150 mg total) by mouth every morning.   doxycycline  100 MG tablet Commonly known as: VIBRA -TABS Take 1 tablet (100 mg total) by mouth 2 (two) times daily for 14 days. Replaces: doxycycline  100 MG capsule   ergocalciferol  1.25 MG (50000 UT) capsule Commonly known as: VITAMIN D2 Take 1 capsule by mouth once a week. What changed: Another medication with the same name was changed. Make sure you understand how and when to take each.   Vitamin D  (Ergocalciferol ) 1.25 MG (50000 UNIT) Caps capsule Commonly known as: DRISDOL  Take 1 capsule by mouth once a week. What changed: when to take this   escitalopram  20 MG tablet Commonly known as: LEXAPRO  Take 1 tablet (20 mg total) by mouth daily.   Hair Skin Nails Caps Take 2 capsules by mouth at bedtime.   insulin  glargine-yfgn 100 UNIT/ML Pen Commonly known as: SEMGLEE  Inject 20 Units into the skin daily.   insulin  lispro 100 UNIT/ML KwikPen Commonly known as: HUMALOG  Use it as sliding scale three times a day with every meals.  CBG 70 - 120: 0 units  CBG 121 - 150: 2 units  CBG 151 - 200: 3 units CBG 201 - 250: 5 units  CBG 251 - 300: 8 units  CBG 301 - 350: 11 units  CBG 351 - 400: 15 units   Lancet Device Misc Use to check blood sugar up to twice daily. May substitute to any manufacturer covered by patient's insurance.   meclizine  25 MG tablet Commonly known as: ANTIVERT  Take 1 tablet (25 mg total) by mouth 3 (three) times daily as needed for dizziness.   medroxyPROGESTERone  Acetate 150 MG/ML Susy Inject 1 mL (150 mg total) into the muscle every 3 (three) months.   metFORMIN  500 MG 24 hr tablet Commonly known as: GLUCOPHAGE -XR Take 2 tablets (1,000 mg total) by mouth 2 (two) times daily with a meal.    multivitamin tablet Take 1 tablet by mouth daily with breakfast.   omeprazole  20 MG capsule Commonly known as: PRILOSEC Take 1 capsule (20 mg total) by mouth daily. What changed: when to take this   Ozempic  (2 MG/DOSE) 8 MG/3ML Sopn Generic drug: Semaglutide  (2 MG/DOSE) Inject 2 mg as directed once a week. What changed: when to take this   Pen Needles 31G X 8 MM Misc Use 3 (three) times daily with meals.   pregabalin  150 MG capsule Commonly known as: LYRICA  Take 1 capsule (150 mg total) by mouth 2 (two) times daily. What changed:  when to take this reasons to take this   traZODone  150 MG tablet Commonly known as: DESYREL  Take 1 tablet (150 mg total) by mouth at bedtime.   triamcinolone   0.025 % ointment Commonly known as: KENALOG  Apply 1 Application topically 2 (two) times daily. What changed:  when to take this reasons to take this   True Metrix Blood Glucose Test test strip Generic drug: glucose blood Use to check blood sugar up to twice daily.   True Metrix Meter w/Device Kit Use to check blood sugar up to twice daily.   valsartan -hydrochlorothiazide  80-12.5 MG tablet Commonly known as: DIOVAN -HCT Take 1 tablet by mouth daily.   zinc  sulfate (50mg  elemental zinc ) 220 (50 Zn) MG capsule Take 1 capsule (220 mg total) by mouth daily. Start taking on: June 30, 2024        Follow-up Information     [name], NP. Schedule an appointment as soon as possible for a visit in 1 week(s).   Specialties: Pulmonary Disease, Endocrinology Contact information: [address] [place] [address] [phone]                Discharge Exam: Fredricka Weights   06/25/24 1857 06/27/24 1450  Weight: 117.9 kg 113.4 kg   General exam: Appears calm and comfortable  Respiratory system: Clear to auscultation. Respiratory effort normal. Cardiovascular system: S1 & S2 heard, RRR. No JVD,  Gastrointestinal system: Abdomen is nondistended, soft and  nontender.  Central nervous system: Alert and oriented. No focal neurological deficits. Extremities: left foot bandaged  Skin: No rashes, lesions or ulcers Psychiatry: Mood & affect appropriate.    Condition at discharge: fair  The results of significant diagnostics from this hospitalization (including imaging, microbiology, ancillary and laboratory) are listed below for reference.   Imaging Studies: VAS US  ABI WITH/WO TBI Result Date: 06/26/2024  LOWER EXTREMITY DOPPLER STUDY Patient Name:  Ericka Marcellus  Date of Exam:   06/26/2024 Medical Rec #: [id number]     Accession #:    [id number] Date of Birth: 1971/09/26     Patient Gender: F Patient Age:   53 years Exam Location:  [facility] Procedure:      VAS US  ABI WITH/WO TBI Referring Phys: --------------------------------------------------------------------------------  Indications: Ulceration. High Risk Factors: Hypertension, Diabetes.  Comparison Study: No prior studies. Performing Technologist: Gerome Ny RVT  Examination Guidelines: A complete evaluation includes at minimum, Doppler waveform signals and systolic blood pressure reading at the level of bilateral brachial, anterior tibial, and posterior tibial arteries, when vessel segments are accessible. Bilateral testing is considered an integral part of a complete examination. Photoelectric Plethysmograph (PPG) waveforms and toe systolic pressure readings are included as required and additional duplex testing as needed. Limited examinations for reoccurring indications may be performed as noted.  ABI Findings: +---------+------------------+-----+-----------+--------+ Right    Rt Pressure (mmHg)IndexWaveform   Comment  +---------+------------------+-----+-----------+--------+ Brachial 139                    triphasic           +---------+------------------+-----+-----------+--------+ PTA      166               1.11 triphasic            +---------+------------------+-----+-----------+--------+ DP       161               1.08 multiphasic         +---------+------------------+-----+-----------+--------+ Great Toe153               1.03                     +---------+------------------+-----+-----------+--------+ +---------+------------------+-----+-----------+-------+  Left     Lt Pressure (mmHg)IndexWaveform   Comment +---------+------------------+-----+-----------+-------+ Brachial 149                    triphasic          +---------+------------------+-----+-----------+-------+ PTA      164               1.10 triphasic          +---------+------------------+-----+-----------+-------+ DP       167               1.12 multiphasic        +---------+------------------+-----+-----------+-------+ Great Toe155               1.04                    +---------+------------------+-----+-----------+-------+ +-------+-----------+-----------+------------+------------+ ABI/TBIToday's ABIToday's TBIPrevious ABIPrevious TBI +-------+-----------+-----------+------------+------------+ Right  1.11       1.03                                +-------+-----------+-----------+------------+------------+ Left   1.12       1.04                                +-------+-----------+-----------+------------+------------+  Summary: Right: Resting right ankle-brachial index is within normal range. The right toe-brachial index is normal. Left: Resting left ankle-brachial index is within normal range. The left toe-brachial index is normal. *See table(s) above for measurements and observations.  Electronically signed by Fonda Rim on 06/26/2024 at 6:49:12 PM.    Final    MR TOES LEFT W WO CONTRAST Result Date: 06/26/2024 CLINICAL DATA:  Foot swelling, diabetic, osteomyelitis suspected, xray done EXAM: MRI OF THE LEFT TOES WITHOUT AND WITH CONTRAST TECHNIQUE: Multiplanar, multisequence MR imaging of the left forefoot was  performed both before and after administration of intravenous contrast. CONTRAST:  10mL GADAVIST  GADOBUTROL  1 MMOL/ML IV SOLN COMPARISON:  Foot radiographs 06/25/2024 and 03/07/2007. FINDINGS: Bones/Joint/Cartilage As seen on radiographs, there is soft tissue swelling in the small toe with a superficial fluid collection laterally, further described below. T2 marrow hyperintensity within the proximal and middle 5th phalanges persists on the inversion recovery images and is associated with low level marrow enhancement following contrast. No gross cortical destruction identified. No significant joint effusions. 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 There are mild degenerative changes at the 1st tarsometatarsal joint. Ligaments Intact Lisfranc ligament. The collateral ligaments of the metatarsophalangeal joints appear intact. Muscles and Tendons No significant musculotendinous findings within the forefoot. Soft tissues As above, there is soft tissue swelling in the small toe with a peripherally enhancing superficial fluid collection laterally which measures 2.4 x 2.6 x 1.1 cm. This collection demonstrates intermediate T1 signal prior to contrast and could reflect a subacute hematoma or abscess. There is surrounding subcutaneous enhancement. Dorsal subcutaneous edema without enhancement. IMPRESSION: 1. Soft tissue swelling in the small toe with a superficial fluid collection laterally which could reflect a subacute hematoma or abscess. Given reported 5th toe wound on recent radiographs, infection likely. 2. Marrow signal abnormality within the proximal and middle 5th phalanges with low level marrow enhancement, suspicious for early osteomyelitis. No gross cortical destruction identified. 3. No other acute osseous findings. Electronically Signed   By: Elsie Gertrude HERO.D.  On: 06/26/2024 10:21   DG Foot Complete Left Result  Date: 06/25/2024 CLINICAL DATA:  Fifth toe wound and cellulitis, initial encounter EXAM: LEFT FOOT - COMPLETE 3+ VIEW COMPARISON:  None Available. FINDINGS: Soft tissue swelling is noted in the fifth digit consistent with the physical exam. No acute fracture or dislocation is noted. No erosive changes to suggest osteomyelitis are noted. Calcaneal spurring is seen. IMPRESSION: No erosive changes to suggest osteomyelitis. Soft tissue swelling in the fifth digit. Electronically Signed   By: Oneil Devonshire M.D.   On: 06/25/2024 20:12    Microbiology: Results for orders placed or performed during the hospital encounter of 06/25/24  Blood culture (routine x 2)     Status: None (Preliminary result)   Collection Time: 06/25/24  7:50 PM   Specimen: BLOOD  Result Value Ref Range Status   Specimen Description   Final    BLOOD RIGHT ANTECUBITAL Performed at [facility], [address], [place], [place] [address]    Special Requests   Final    BOTTLES DRAWN AEROBIC AND ANAEROBIC Blood Culture adequate volume Performed at [facility], [address] [place], [place] [address]    Culture   Final    NO GROWTH 4 DAYS Performed at [facility], [address] [place], [place] [address]    Report Status PENDING  Incomplete  Blood culture (routine x 2)     Status: None (Preliminary result)   Collection Time: 06/25/24  8:49 PM   Specimen: BLOOD  Result Value Ref Range Status   Specimen Description   Final    BLOOD BLOOD RIGHT FOREARM Performed at [facility], [address], [place], [place] [address]    Special Requests   Final    BOTTLES DRAWN AEROBIC AND ANAEROBIC Blood Culture results may not be optimal due to an inadequate volume of blood received in culture bottles Performed at [facility], [address], [place], [place] [address]    Culture   Final    NO GROWTH 3 DAYS Performed at [facility], [address], [place], [place] [address]    Report Status PENDING  Incomplete  Surgical pcr screen     Status: None   Collection Time: 06/27/24  6:12 AM   Specimen: Nasal Mucosa; Nasal Swab  Result Value Ref Range Status   MRSA, PCR NEGATIVE NEGATIVE Final   Staphylococcus aureus NEGATIVE NEGATIVE Final    Comment: (NOTE) The Xpert SA Assay (FDA approved for NASAL specimens in patients 49 years of age and older), is one component of a comprehensive surveillance program. It is not intended to diagnose infection nor to guide or monitor treatment. Performed at [facility], [address], [place], [place] [address]   Aerobic Culture w Gram Stain (superficial specimen)     Status: None (Preliminary result)   Collection Time: 06/27/24  4:17 PM   Specimen: Wound  Result Value Ref Range Status   Specimen Description WOUND  Final   Special Requests LEFT FIFTH TOE  Final   Gram Stain NO WBC SEEN RARE GRAM POSITIVE COCCI IN PAIRS   Final   Culture   Final    ABUNDANT STAPHYLOCOCCUS AUREUS ABUNDANT STAPHYLOCOCCUS LUGDUNENSIS SUSCEPTIBILITIES TO FOLLOW Performed at [facility], [address] [place], [place] [address]    Report Status PENDING  Incomplete  Aerobic Culture w Gram Stain (superficial specimen)     Status: None (Preliminary result)  Collection Time: 06/27/24  4:28 PM   Specimen: Wound  Result Value Ref Range Status   Specimen Description WOUND  Final   Special Requests LEFT FIFTH TOE 2  Final   Gram Stain   Final    RARE WBC PRESENT, PREDOMINANTLY PMN RARE GRAM POSITIVE COCCI    Culture   Final    MODERATE STAPHYLOCOCCUS AUREUS MODERATE STAPHYLOCOCCUS LUGDUNENSIS CULTURE REINCUBATED FOR BETTER GROWTH Performed at [facility], [address] [place], [place] [address]    Report Status PENDING  Incomplete  Aerobic Culture w Gram Stain (superficial specimen)     Status: None (Preliminary result)   Collection Time: 06/27/24  4:28  PM   Specimen: Wound  Result Value Ref Range Status   Specimen Description WOUND  Final   Special Requests LEFT FIFTH TOE 3  Final   Gram Stain NO WBC SEEN RARE GRAM POSITIVE COCCI IN PAIRS   Final   Culture   Final    MODERATE STAPHYLOCOCCUS AUREUS SUSCEPTIBILITIES TO FOLLOW Performed at [facility], [address], [place], [place] [address]    Report Status PENDING  Incomplete    Labs: CBC: Recent Labs  Lab 06/25/24 1950 06/26/24 0422 06/27/24 0434 06/29/24 0445  WBC 9.6 8.6 8.6 6.8  NEUTROABS 6.9  --   --   --   HGB 11.4* 10.9* 11.5* 11.4*  HCT 34.5* 33.8* 35.9* 35.8*  MCV 85.4 86.7 87.6 88.0  PLT 304 287 296 323   Basic Metabolic Panel: Recent Labs  Lab 06/25/24 1950 06/26/24 0422 06/27/24 0434 06/29/24 0445  NA 139 137 138 138  K 3.1* 3.3* 3.9 3.8  CL 100 103 108 105  CO2 25 22 21* 24  GLUCOSE 206* 272* 230* 193*  BUN 6 6 <5* <5*  CREATININE 0.74 0.57 0.63 0.61  CALCIUM  9.8 8.6* 8.4* 8.7*  MG  --  1.5*  --   --    Liver Function Tests: No results for input(s): AST, ALT, ALKPHOS, BILITOT, PROT, ALBUMIN in the last 168 hours. CBG: Recent Labs  Lab 06/28/24 0729 06/28/24 1126 06/28/24 1631 06/28/24 2157 06/29/24 0721  GLUCAP 204* 256* 205* 208* 210*    Discharge time spent: 41 minutes.   Signed: Elgie Butter, MD [facility] 06/29/2024

## 2024-06-29 NOTE — Consult Note (Signed)
 [facility]    Date of Admission:  06/25/2024     Reason for Consult: foot abscess    Referring Provider: Cherlyn Labella       Abx: Linezolid /flagyl /cefepime         Assessment: #diabetic foot infection #pcn allergy childhood  7/15 I&D left fith toe infected  hematoma A curette was then used to debride the base of the wound to the level of the periosteum and bone. Some of the material that was debrided with a curette was collected with a rondure and placed into a specimen cup to be sent to microbiology. At this point there were healthy bleeding edges around the entire wound.  7/14 mri left foot 1. Soft tissue swelling in the small toe with a superficial fluid collection laterally which could reflect a subacute hematoma or abscess. Given reported 5th toe wound on recent radiographs, infection likely. 2. Marrow signal abnormality within the proximal and middle 5th phalanges with low level marrow enhancement, suspicious for early osteomyelitis. No gross cortical destruction identified. 3. No other acute osseous findings.  There was no open ulcer.   Operative cx from 7/15 staph aureus. Had received cephalexin /doxy since 05/31/24   Given the fact of almost 4 weeks abx and no cortical destruction, probably would do 2 more weeks   Plan: Doxy/augmentin  would be sufficient; tolerated cephalexin  and minor childhood pcn allergy unlikely a problem anymore, and should be fine with augmentin  ID clinic f/u 7/28 @ 330 Please have 2 weeks doxy 100  mg po bid and augmentin  875 mg po bid Maintain standard isolation precaution while here Ok to discharge from id standpoint and we can f/u culture and adjust as needed outpatient Will sign off Discussed with primary team     ------------------------------------------------ Principal Problem:   Cellulitis of left lower extremity Active Problems:   Type 2 diabetes mellitus with hyperglycemia,  without long-term current use of insulin  (HCC)   Psychophysiological insomnia   Hypokalemia   Depression   Hypertension   Hematoma of left little toe   Blister of toe, infected, left, initial encounter   Cutaneous abscess of left foot    HPI: Erica [name] is a 53 y.o. female dm2 here with initial left 5th toe ulcer/cellulitis closed with abx but then complicated by abscess  Afebrile Mri marrow edema over the 5th toe left foot but no cortical destruction S/p I&D by ortho Cx gpc She has been on cephalexin /doxy  Child hood pcn allergy but tolerated cephalexin   No n/v/diarrhea   She does want to go home today  Family History  Problem Relation Age of Onset   Hypertension Mother    Heart failure Mother    Fibroids Mother    Breast cancer Sister    Fibroids Sister    Fibroids Maternal Grandmother    Diabetes type II Paternal Grandmother     Social History   Tobacco Use   Smoking status: Never   Smokeless tobacco: Never  Vaping Use   Vaping status: Never Used  Substance Use Topics   Alcohol use: No   Drug use: No    Allergies  Allergen Reactions   Penicillins Other (See Comments)    Reported from childhood    Review of Systems: ROS All Other ROS was negative, except mentioned above   Past Medical History:  Diagnosis Date   Arthritis    Depression    Diabetes mellitus    Fatty liver  Hernia, abdominal    Hypercholesteremia    IBS (irritable bowel syndrome)        Scheduled Meds:  ascorbic acid   500 mg Oral BID   atorvastatin   80 mg Oral Daily   buPROPion   150 mg Oral q morning   docusate sodium   50 mg Oral Once   enoxaparin  (LOVENOX ) injection  40 mg Subcutaneous Q24H   escitalopram   20 mg Oral Daily   insulin  aspart  0-15 Units Subcutaneous TID WC   insulin  aspart  0-5 Units Subcutaneous QHS   insulin  glargine-yfgn  20 Units Subcutaneous Daily   irbesartan   75 mg Oral Daily   linezolid   600 mg Oral Q12H   metroNIDAZOLE   500 mg Oral Q12H    pantoprazole   40 mg Oral Daily   sodium chloride  flush  3 mL Intravenous Q12H   traZODone   150 mg Oral QHS   zinc  sulfate (50mg  elemental zinc )  220 mg Oral Daily   Continuous Infusions:  ceFEPime  (MAXIPIME ) IV 2 g (06/29/24 0519)   PRN Meds:.acetaminophen  **OR** acetaminophen , HYDROmorphone  (DILAUDID ) injection, oxyCODONE , polyethylene glycol, prochlorperazine    OBJECTIVE: Blood pressure 132/74, pulse 84, temperature 98.6 F (37 C), temperature source Oral, resp. rate 15, height 6' (1.829 m), weight 113.4 kg, SpO2 97%.  Physical Exam General/constitutional: no distress, pleasant HEENT: Normocephalic, PER, Conj Clear, EOMI, Oropharynx clear Neck supple CV: rrr no mrg Lungs: clear to auscultation, normal respiratory effort Abd: Soft, Nontender Ext: no edema Skin: No Rash Neuro: nonfocal MSK: left foot dressing c/d/I no strike through   Lab Results Lab Results  Component Value Date   WBC 6.8 06/29/2024   HGB 11.4 (L) 06/29/2024   HCT 35.8 (L) 06/29/2024   MCV 88.0 06/29/2024   PLT 323 06/29/2024    Lab Results  Component Value Date   CREATININE 0.61 06/29/2024   BUN <5 (L) 06/29/2024   NA 138 06/29/2024   K 3.8 06/29/2024   CL 105 06/29/2024   CO2 24 06/29/2024    Lab Results  Component Value Date   ALT 15 02/07/2024   AST 14 02/07/2024   ALKPHOS 94 02/07/2024   BILITOT 0.3 02/07/2024      Microbiology: Recent Results (from the past 240 hours)  Blood culture (routine x 2)     Status: None (Preliminary result)   Collection Time: 06/25/24  7:50 PM   Specimen: BLOOD  Result Value Ref Range Status   Specimen Description   Final    BLOOD RIGHT ANTECUBITAL Performed at [facility], [address], [place], [place] [address]    Special Requests   Final    BOTTLES DRAWN AEROBIC AND ANAEROBIC Blood Culture adequate volume Performed at [facility], [address], [place], [place] [address]    Culture   Final    NO  GROWTH 4 DAYS Performed at [facility], [address], [place], [place] [address]    Report Status PENDING  Incomplete  Blood culture (routine x 2)     Status: None (Preliminary result)   Collection Time: 06/25/24  8:49 PM   Specimen: BLOOD  Result Value Ref Range Status   Specimen Description   Final    BLOOD BLOOD RIGHT FOREARM Performed at [facility], [address] [place], [place] [address]    Special Requests   Final    BOTTLES DRAWN AEROBIC AND ANAEROBIC Blood Culture results may not be optimal due to an inadequate volume of blood received in culture  bottles Performed at [facility], [address], [place], [place] [address]    Culture   Final    NO GROWTH 3 DAYS Performed at [facility], [address], [place], [place] [address]    Report Status PENDING  Incomplete  Surgical pcr screen     Status: None   Collection Time: 06/27/24  6:12 AM   Specimen: Nasal Mucosa; Nasal Swab  Result Value Ref Range Status   MRSA, PCR NEGATIVE NEGATIVE Final   Staphylococcus aureus NEGATIVE NEGATIVE Final    Comment: (NOTE) The Xpert SA Assay (FDA approved for NASAL specimens in patients 61 years of age and older), is one component of a comprehensive surveillance program. It is not intended to diagnose infection nor to guide or monitor treatment. Performed at [facility], [address], [place], [place] [address]   Aerobic Culture w Gram Stain (superficial specimen)     Status: None (Preliminary result)   Collection Time: 06/27/24  4:17 PM   Specimen: Wound  Result Value Ref Range Status   Specimen Description WOUND  Final   Special Requests LEFT FIFTH TOE  Final   Gram Stain   Final    NO WBC SEEN RARE GRAM POSITIVE COCCI IN PAIRS Performed at [facility], [address], [place], [place] [address]    Culture ABUNDANT STAPHYLOCOCCUS AUREUS  Final   Report Status PENDING  Incomplete  Aerobic  Culture w Gram Stain (superficial specimen)     Status: None (Preliminary result)   Collection Time: 06/27/24  4:28 PM   Specimen: Wound  Result Value Ref Range Status   Specimen Description WOUND  Final   Special Requests LEFT FIFTH TOE 2  Final   Gram Stain   Final    RARE WBC PRESENT, PREDOMINANTLY PMN RARE GRAM POSITIVE COCCI Performed at [facility], [address], [place], [place] [address]    Culture MODERATE STAPHYLOCOCCUS AUREUS  Final   Report Status PENDING  Incomplete  Aerobic Culture w Gram Stain (superficial specimen)     Status: None (Preliminary result)   Collection Time: 06/27/24  4:28 PM   Specimen: Wound  Result Value Ref Range Status   Specimen Description WOUND  Final   Special Requests LEFT FIFTH TOE 3  Final   Gram Stain NO WBC SEEN RARE GRAM POSITIVE COCCI IN PAIRS   Final   Culture   Final    MODERATE STAPHYLOCOCCUS AUREUS SUSCEPTIBILITIES TO FOLLOW Performed at [facility], [address], [place], [place] [address]    Report Status PENDING  Incomplete     Serology:    Imaging: If present, new imagings (plain films, ct scans, and mri) have been personally visualized and interpreted; radiology reports have been reviewed. Decision making incorporated into the Impression / Recommendations.  7/14 mri left foot see above  Constance ONEIDA Passer, MD [facility] [phone] pager    06/29/2024, 12:42 PM

## 2024-06-29 NOTE — Progress Notes (Addendum)
 Orthopedic Surgery Progress Note   Assessment: Patient is a 53 y.o. female with left fifth toe infected hematoma status post I&D   Plan: -Operative plans: complete -Packing removed this morning -Diet: diabetic -Okay for DVT prophylaxis from ortho perspective -Antibiotics: per primary, can eventually tailor to the specimen if it grows out any organisms -Follow-up Intra-op cultures (GPCs) -Weight bearing status: as tolerated in post op shoe -PT evaluate and treat -Pain control -Would recommend a stricter diabetic diet especially in the setting of poorly controlled diabetes and a diabetic foot infection but have talked with nutrition previously who feel that pancakes and regular soda is okay for a diabetic patient -Dispo: per primary  ___________________________________________________________________________  Subjective: No acute events overnight. Pain controlled. Wants to go back to sleep.     Physical Exam:  General: no acute distress, appears stated age, regular soda at bedside Neurologic: alert, answering questions appropriately, following commands Respiratory: unlabored breathing on room air, symmetric chest rise Psychiatric: appropriate affect, normal cadence to speech  MSK:   -Left lower extremity  Dressings over foot with small amount of dry blood, no drainage from wound, no erythema around wound, sutures in place with small area open proximally where packing was EHL/TA/GSC intact Plantarflexes and dorsiflexes toes Sensation intact to light touch in sural, saphenous, tibial, deep peroneal, and superficial peroneal nerve distributions (decreased significantly in all distributions) Foot warm and well perfused   Patient name: Erica [name] Patient MRN: [id number] Date: 06/29/24

## 2024-06-29 NOTE — Progress Notes (Signed)
 Patient discharged home, IV removed, discharge paperwork provided and explained, patient verbalized understanding.

## 2024-06-29 NOTE — Progress Notes (Signed)
 Received secure chat from Hospitalist team   Dr Overton, Sorry for bombarding you with so many questions today. I need one last recommendation for today.  Ms Depner was admitted for early osteomyelitis of the left fifth toe/ diabetic toe infection. Dr Georgina with ortho, did I&D,. cultures are pending. She is adamant about going home today. she has been on linozolid and cefepime  from 7/13 and the I&D was done on 7/15. Can I get recommendations on antibiotics and duration. thank you.   Per Dr. Overton place referral with recommend antibiotics for 6 weeks.   Will see ID Ronelle, NP) on 07/11/27  Enis Kleine, LPN

## 2024-06-29 NOTE — Inpatient Diabetes Management (Signed)
 Inpatient Diabetes Program Recommendations  AACE/ADA: New Consensus Statement on Inpatient Glycemic Control (2015)  Target Ranges:  Prepandial:   less than 140 mg/dL      Peak postprandial:   less than 180 mg/dL (1-2 hours)      Critically ill patients:  140 - 180 mg/dL   Lab Results  Component Value Date   GLUCAP 210 (H) 06/29/2024   HGBA1C 11.3 (H) 06/26/2024    Review of Glycemic Control  Latest Reference Range & Units 06/28/24 07:29 06/28/24 11:26 06/28/24 16:31 06/28/24 21:57 06/29/24 07:21  Glucose-Capillary 70 - 99 mg/dL 795 (H) 743 (H) 794 (H) 208 (H) 210 (H)  (H): Data is abnormally high   Inpatient Diabetes Program Recommendations:    Please consider increasing Semglee  to 20 units every day.  Thank you, Wyvonna Pinal, MSN, CDCES Diabetes Coordinator Inpatient Diabetes Program [phone] (team pager from 8a-5p)

## 2024-06-30 ENCOUNTER — Telehealth: Payer: Self-pay

## 2024-06-30 ENCOUNTER — Other Ambulatory Visit: Payer: Self-pay

## 2024-06-30 ENCOUNTER — Other Ambulatory Visit: Payer: Self-pay | Admitting: Internal Medicine

## 2024-06-30 DIAGNOSIS — L0291 Cutaneous abscess, unspecified: Secondary | ICD-10-CM

## 2024-06-30 LAB — AEROBIC CULTURE W GRAM STAIN (SUPERFICIAL SPECIMEN)
Gram Stain: NONE SEEN
Gram Stain: NONE SEEN

## 2024-06-30 LAB — CULTURE, BLOOD (ROUTINE X 2)
Culture: NO GROWTH
Special Requests: ADEQUATE

## 2024-06-30 MED ORDER — SULFAMETHOXAZOLE-TRIMETHOPRIM 800-160 MG PO TABS
2.0000 | ORAL_TABLET | Freq: Two times a day (BID) | ORAL | 0 refills | Status: AC
  Filled 2024-06-30: qty 56, 14d supply, fill #0

## 2024-06-30 NOTE — Transitions of Care (Post Inpatient/ED Visit) (Signed)
   06/30/2024  Name: Erica [name] MRN: [id number] DOB: 12-19-1970  Today's TOC FU Call Status: Today's TOC FU Call Status:: Unsuccessful Call (1st Attempt) Unsuccessful Call (1st Attempt) Date: 06/30/24  Attempted to reach the patient regarding the most recent Inpatient/ED visit.  Follow Up Plan: Additional outreach attempts will be made to reach the patient to complete the Transitions of Care (Post Inpatient/ED visit) call.   Arvin Seip RN, BSN, CCM [employer], Population Health Case Manager Phone: [phone]

## 2024-06-30 NOTE — Progress Notes (Signed)
 Mrsa from abscess is resistant to doxycycline  Cx also has staph lugdenensis   Both sensitive to bactrim  Will do bactrim 2 ds tablets bid On augmentin /doxy --> stop doxy; continue augmentin   Duration remains 2 weeks   Called and relayed abx change to patient

## 2024-07-01 LAB — CULTURE, BLOOD (ROUTINE X 2): Culture: NO GROWTH

## 2024-07-03 ENCOUNTER — Telehealth: Payer: Self-pay

## 2024-07-03 ENCOUNTER — Other Ambulatory Visit: Payer: Self-pay

## 2024-07-03 NOTE — Transitions of Care (Post Inpatient/ED Visit) (Signed)
   07/03/2024  Name: Erica [name] MRN: [id number] DOB: 02-13-1971  Today's TOC FU Call Status: Today's TOC FU Call Status:: Unsuccessful Call (2nd Attempt) Unsuccessful Call (2nd Attempt) Date: 07/03/24  Attempted to reach the patient regarding the most recent Inpatient/ED visit.  Follow Up Plan: Additional outreach attempts will be made to reach the patient to complete the Transitions of Care (Post Inpatient/ED visit) call.   Shona Prow RN, CCM [facility]  VBCI-Population Health RN Care Manager [phone]

## 2024-07-04 ENCOUNTER — Telehealth: Payer: Self-pay

## 2024-07-04 NOTE — Transitions of Care (Post Inpatient/ED Visit) (Signed)
   07/04/2024  Name: Candas Deemer MRN: [id number] DOB: 08/15/1971  Today's TOC FU Call Status: Today's TOC FU Call Status:: Unsuccessful Call (3rd Attempt) Unsuccessful Call (3rd Attempt) Date: 07/04/24  Attempted to reach the patient regarding the most recent Inpatient/ED visit.  Follow Up Plan: No further outreach attempts will be made at this time. We have been unable to contact the patient.  Arvin Seip RN, BSN, CCM [employer], Population Health Case Manager Phone: [phone]

## 2024-07-06 ENCOUNTER — Other Ambulatory Visit: Payer: Self-pay

## 2024-07-07 ENCOUNTER — Telehealth: Payer: Self-pay

## 2024-07-07 NOTE — Telephone Encounter (Signed)
 Copied from CRM [id number]. Topic: Appointments - Appointment Info/Confirmation >> Jul 07, 2024  9:45 AM Travis F wrote: Patient is calling in because she would like to know if her appointment on 07/17/24 is necessary. Patient says she was in the hospital and her diabetes medication was changed and she wants to know if she still needed to come to her appointment on the 4th.   She will be advised that she does. Kh

## 2024-07-10 ENCOUNTER — Encounter: Payer: Self-pay | Admitting: Infectious Diseases

## 2024-07-10 ENCOUNTER — Other Ambulatory Visit: Payer: Self-pay

## 2024-07-10 ENCOUNTER — Ambulatory Visit (INDEPENDENT_AMBULATORY_CARE_PROVIDER_SITE_OTHER): Payer: Self-pay | Admitting: Infectious Diseases

## 2024-07-10 VITALS — BP 114/78 | HR 92 | Temp 98.0°F | Ht 72.0 in | Wt 248.0 lb

## 2024-07-10 DIAGNOSIS — E11628 Type 2 diabetes mellitus with other skin complications: Secondary | ICD-10-CM | POA: Diagnosis not present

## 2024-07-10 DIAGNOSIS — L02612 Cutaneous abscess of left foot: Secondary | ICD-10-CM

## 2024-07-10 DIAGNOSIS — L0291 Cutaneous abscess, unspecified: Secondary | ICD-10-CM

## 2024-07-10 DIAGNOSIS — B9562 Methicillin resistant Staphylococcus aureus infection as the cause of diseases classified elsewhere: Secondary | ICD-10-CM

## 2024-07-10 DIAGNOSIS — E1121 Type 2 diabetes mellitus with diabetic nephropathy: Secondary | ICD-10-CM

## 2024-07-10 DIAGNOSIS — Z7984 Long term (current) use of oral hypoglycemic drugs: Secondary | ICD-10-CM

## 2024-07-10 DIAGNOSIS — B957 Other staphylococcus as the cause of diseases classified elsewhere: Secondary | ICD-10-CM | POA: Diagnosis not present

## 2024-07-10 DIAGNOSIS — Z7985 Long-term (current) use of injectable non-insulin antidiabetic drugs: Secondary | ICD-10-CM

## 2024-07-10 DIAGNOSIS — M869 Osteomyelitis, unspecified: Secondary | ICD-10-CM

## 2024-07-10 NOTE — Patient Instructions (Addendum)
 Nice to see you   Please continue both of your antibiotics until we get your labs back.   Likely will do 1-2 weeks of the antibiotics after we ensure labs look OK.   Please schedule a follow up with Dr. Overton in 2-3 weeks

## 2024-07-10 NOTE — Progress Notes (Unsigned)
 Patient: Erica [name]  DOB: 03/22/1971 MRN: [id number] PCP: [name], NP  Referring Provider: Hospital FU   Reason for Visit: Left DFU w/abscess    Subjective   Subjective:   Chief Complaint  Patient presents with   Follow-up    Discussed the use of AI scribe software for clinical note transcription with the patient, who gave verbal consent to proceed.  History of Present Illness   Erica [name] is a 53 year old female with diabetes who presents for a hospital follow-up after treatment for a foot abscess. She was seen by Dr. Overton for follow-up after a foot abscess consultation.  She was hospitalized on July 17th for a foot abscess with a contiguous ulcer on the left pinky toe. An MRI conducted on July 15th showed soft tissue swelling in the small toe with superficial fluid collection and marrow signal abnormality in the proximal and middle fifth phalange, with low-level enhancement suspicious for early osteomyelitis. An incision and drainage of the left fifth toe was performed to evacuate an infected hematoma.  Initially, she was treated with a course of Cefalexin and doxycycline  starting June 18th. However, cultures finalized on July 18th revealed a doxycycline -resistant MRSA and Staphylococcus, both sensitive to Bactrim . Consequently, her antibiotic regimen was switched to two double-strength tablets of Bactrim  BID and continued on Augmentin , while doxycycline  was discontinued.  She has no pain in the affected area and describes the sensation in her feet as numb, though she can feel general touch. She has not experienced nausea but reports a decreased appetite. No diarrhea.  She is concerned about the duration of her work absence due to her condition. She also has a history of penicillin allergy from infancy.        Review of Systems  Constitutional:  Negative for chills and fever.  Gastrointestinal:  Negative for abdominal pain, nausea and vomiting.    Past Medical  History:  Diagnosis Date   Arthritis    Depression    Diabetes mellitus    Fatty liver    Hernia, abdominal    Hypercholesteremia    IBS (irritable bowel syndrome)     Outpatient Medications Prior to Visit  Medication Sig Dispense Refill   amoxicillin -clavulanate (AUGMENTIN ) 875-125 MG tablet Take 1 tablet by mouth 2 (two) times daily for 14 days. 28 tablet 0   sulfamethoxazole -trimethoprim  (BACTRIM  DS) 800-160 MG tablet Take 2 tablets by mouth 2 (two) times daily for 14 days. 56 tablet 0   ascorbic acid  (VITAMIN C ) 500 MG tablet Take 1 tablet (500 mg total) by mouth 2 (two) times daily. 60 tablet 2   aspirin  81 MG tablet Take 81 mg by mouth at bedtime.      atorvastatin  (LIPITOR) 40 MG tablet Take 2 tablets (80 mg total) by mouth daily. 90 tablet 3   Blood Glucose Monitoring Suppl (TRUE METRIX METER) w/Device KIT Use to check blood sugar up to twice daily. 1 kit 0   buPROPion  (WELLBUTRIN  XL) 150 MG 24 hr tablet Take 1 tablet (150 mg total) by mouth every morning. 30 tablet 2   ergocalciferol  (VITAMIN D2) 1.25 MG (50000 UT) capsule Take 1 capsule by mouth once a week. (Patient not taking: Reported on 06/26/2024) 4 capsule 2   ergocalciferol  (VITAMIN D2) 1.25 MG (50000 UT) capsule Take 1 capsule by mouth once a week. (Patient taking differently: Take 50,000 Units by mouth every Sunday.) 4 capsule 2   escitalopram  (LEXAPRO ) 20 MG tablet Take 1 tablet (  20 mg total) by mouth daily. 90 tablet 3   Glucose Blood (BLOOD GLUCOSE TEST STRIPS) STRP Use to check blood sugar up to twice daily. 100 strip 3   insulin  glargine-yfgn (SEMGLEE ) 100 UNIT/ML Pen Inject 20 Units into the skin daily. 6 mL 2   insulin  lispro (HUMALOG ) 100 UNIT/ML KwikPen Use it as sliding scale three times a day with every meals.  CBG 70 - 120: 0 units  CBG 121 - 150: 2 units  CBG 151 - 200: 3 units CBG 201 - 250: 5 units  CBG 251 - 300: 8 units  CBG 301 - 350: 11 units  CBG 351 - 400: 15 units 15 mL 11   Insulin  Pen Needle  (PEN NEEDLES) 31G X 8 MM MISC Use 3 (three) times daily with meals. 100 each 3   Lancet Device MISC Use to check blood sugar up to twice daily. May substitute to any manufacturer covered by patient's insurance. 1 each 0   meclizine  (ANTIVERT ) 25 MG tablet Take 1 tablet (25 mg total) by mouth 3 (three) times daily as needed for dizziness. 30 tablet 0   medroxyPROGESTERone  Acetate 150 MG/ML SUSY Inject 1 mL (150 mg total) into the muscle every 3 (three) months. 1 mL 0   metFORMIN  (GLUCOPHAGE -XR) 500 MG 24 hr tablet Take 2 tablets (1,000 mg total) by mouth 2 (two) times daily with a meal. 360 tablet 1   Multiple Vitamin (MULTIVITAMIN) tablet Take 1 tablet by mouth daily with breakfast.     Multiple Vitamins-Minerals (HAIR SKIN NAILS) CAPS Take 2 capsules by mouth at bedtime.     omeprazole  (PRILOSEC) 20 MG capsule Take 1 capsule (20 mg total) by mouth daily. (Patient taking differently: Take 20 mg by mouth daily before breakfast.) 30 capsule 3   pregabalin  (LYRICA ) 150 MG capsule Take 1 capsule (150 mg total) by mouth 2 (two) times daily. (Patient taking differently: Take 150 mg by mouth 2 (two) times daily as needed (for nerve pain).) 60 capsule 1   Semaglutide , 2 MG/DOSE, (OZEMPIC , 2 MG/DOSE,) 8 MG/3ML SOPN Inject 2 mg as directed once a week. (Patient taking differently: Inject 2 mg as directed every Sunday.) 3 mL 2   traZODone  (DESYREL ) 150 MG tablet Take 1 tablet (150 mg total) by mouth at bedtime. 90 tablet 2   triamcinolone  (KENALOG ) 0.025 % ointment Apply 1 Application topically 2 (two) times daily. (Patient taking differently: Apply 1 Application topically 2 (two) times daily as needed (for itching).) 30 g 0   valsartan -hydrochlorothiazide  (DIOVAN -HCT) 80-12.5 MG tablet Take 1 tablet by mouth daily. 90 tablet 3   zinc  sulfate, 50mg  elemental zinc , 220 (50 Zn) MG capsule Take 1 capsule (220 mg total) by mouth daily. 60 capsule 2   Facility-Administered Medications Prior to Visit  Medication Dose  Route Frequency Provider Last Rate Last Admin   testosterone  cypionate (DEPOTESTOSTERONE CYPIONATE) injection 150 mg  150 mg Intramuscular Q90 days Nichols, Tonya S, NP   150 mg at 05/04/24 9165     No Active Allergies   Social History   Tobacco Use   Smoking status: Never   Smokeless tobacco: Never  Vaping Use   Vaping status: Never Used  Substance Use Topics   Alcohol use: No   Drug use: No    Family History  Problem Relation Age of Onset   Hypertension Mother    Heart failure Mother    Fibroids Mother    Breast cancer Sister    Fibroids Sister  Fibroids Maternal Grandmother    Diabetes type II Paternal Grandmother        Objective   Objective:   Vitals:   07/10/24 1510  BP: 114/78  Pulse: 92  Temp: 98 F (36.7 C)  TempSrc: Temporal  SpO2: 96%  Weight: 248 lb (112.5 kg)  Height: 6' (1.829 m)   Body mass index is 33.63 kg/m.  Physical Exam Cardiovascular:     Rate and Rhythm: Normal rate.  Pulmonary:     Effort: Pulmonary effort is normal.     Breath sounds: Normal breath sounds.  Skin:    General: Skin is warm.     Findings: Rash: fine papular rash noted to right antecubital fossa c/w eczematous eruption.  Neurological:     Mental Status: She is alert and oriented to person, place, and time.         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 No open ulceration. Underwent incision and drainage. MRI indicated early osteomyelitis, but bone appeared intact on operative report. Cultures revealed doxycycline -resistant MRSA, sensitive to Bactrim . Redness and swelling have improved, and tissue appears to be healing well - there is some tissue that will slough off when sutures are released.  - Continue Bactrim  through the end of the week - may plan 1-2 more week of bactrim  given doxycycline  was  inactive.  - Order blood work to monitor potassium levels and kidney function - Discussed potential side effects of Bactrim , including nausea, poor appetite, altered potassium levels, and kidney impact - she is tolerating well w/o effect.  - FU with Dr. Overton recommended in 3-4 weeks to ensure she has had enough treatment  Diabetes mellitus - Diabetes mellitus is a contributing factor to the foot abscess and infection. A1c 11.3% - uncontrolled which will make her high risk for recurrent infections and ongoing delay in wound healing.  - Goal < / = 7%  Peripheral neuropathy - Peripheral neuropathy is present, contributing to numbness in the feet and decreased sensation  Penicillin allergy as child -  Penicillin allergy noted. Current antibiotics include Augmentin , which she tolerates without allergic reactions. She has a localized papular rash to bilateral antecubital spaces that appears more c/w eczematous eruption not drug eruption.  - remove pcn allergy from chart    Orders Placed This Encounter  Procedures   COMPLETE METABOLIC PANEL WITHOUT GFR   C-reactive protein   CBC w/Diff    No orders of the defined types were placed in this encounter.   Return in about 3 weeks (around 07/31/2024).   Corean Fireman, MSN, NP-C [facility] for Infectious Disease [facility]  [email or web address] Pager: [phone] Office: [phone] [facility] Main Line: [phone] *Secure Chat Communication Welcome

## 2024-07-11 ENCOUNTER — Other Ambulatory Visit: Payer: Self-pay

## 2024-07-11 ENCOUNTER — Ambulatory Visit: Payer: Self-pay | Admitting: Infectious Diseases

## 2024-07-11 ENCOUNTER — Telehealth: Payer: Self-pay | Admitting: Orthopedic Surgery

## 2024-07-11 DIAGNOSIS — L0291 Cutaneous abscess, unspecified: Secondary | ICD-10-CM

## 2024-07-11 LAB — CBC WITH DIFFERENTIAL/PLATELET
Absolute Lymphocytes: 1758 {cells}/uL (ref 850–3900)
Absolute Monocytes: 384 {cells}/uL (ref 200–950)
Basophils Absolute: 30 {cells}/uL (ref 0–200)
Basophils Relative: 0.5 %
Eosinophils Absolute: 118 {cells}/uL (ref 15–500)
Eosinophils Relative: 2 %
HCT: 38.5 % (ref 35.0–45.0)
Hemoglobin: 12.1 g/dL (ref 11.7–15.5)
MCH: 27.8 pg (ref 27.0–33.0)
MCHC: 31.4 g/dL — ABNORMAL LOW (ref 32.0–36.0)
MCV: 88.5 fL (ref 80.0–100.0)
MPV: 8.9 fL (ref 7.5–12.5)
Monocytes Relative: 6.5 %
Neutro Abs: 3611 {cells}/uL (ref 1500–7800)
Neutrophils Relative %: 61.2 %
Platelets: 400 Thousand/uL (ref 140–400)
RBC: 4.35 Million/uL (ref 3.80–5.10)
RDW: 12 % (ref 11.0–15.0)
Total Lymphocyte: 29.8 %
WBC: 5.9 Thousand/uL (ref 3.8–10.8)

## 2024-07-11 LAB — COMPLETE METABOLIC PANEL WITHOUT GFR
AG Ratio: 1.6 (calc) (ref 1.0–2.5)
ALT: 15 U/L (ref 6–29)
AST: 15 U/L (ref 10–35)
Albumin: 4.2 g/dL (ref 3.6–5.1)
Alkaline phosphatase (APISO): 79 U/L (ref 37–153)
BUN/Creatinine Ratio: 9 (calc) (ref 6–22)
BUN: 6 mg/dL — ABNORMAL LOW (ref 7–25)
CO2: 22 mmol/L (ref 20–32)
Calcium: 9.6 mg/dL (ref 8.6–10.4)
Chloride: 101 mmol/L (ref 98–110)
Creat: 0.66 mg/dL (ref 0.50–1.03)
Globulin: 2.7 g/dL (ref 1.9–3.7)
Glucose, Bld: 193 mg/dL — ABNORMAL HIGH (ref 65–99)
Potassium: 4.2 mmol/L (ref 3.5–5.3)
Sodium: 137 mmol/L (ref 135–146)
Total Bilirubin: 0.8 mg/dL (ref 0.2–1.2)
Total Protein: 6.9 g/dL (ref 6.1–8.1)

## 2024-07-11 LAB — C-REACTIVE PROTEIN: CRP: 3 mg/L (ref <8.0)

## 2024-07-11 MED ORDER — SULFAMETHOXAZOLE-TRIMETHOPRIM 800-160 MG PO TABS
1.0000 | ORAL_TABLET | Freq: Two times a day (BID) | ORAL | 0 refills | Status: AC
  Filled 2024-07-11: qty 28, 14d supply, fill #0

## 2024-07-11 NOTE — Telephone Encounter (Signed)
 Patient called. She has an appointment tomorrow. Wants to know will she get a return to work note tomorrow?

## 2024-07-12 ENCOUNTER — Other Ambulatory Visit: Payer: Self-pay

## 2024-07-12 ENCOUNTER — Ambulatory Visit (INDEPENDENT_AMBULATORY_CARE_PROVIDER_SITE_OTHER): Payer: Self-pay | Admitting: Orthopedic Surgery

## 2024-07-12 ENCOUNTER — Other Ambulatory Visit: Payer: Self-pay | Admitting: Nurse Practitioner

## 2024-07-12 ENCOUNTER — Telehealth: Payer: Self-pay

## 2024-07-12 DIAGNOSIS — L089 Local infection of the skin and subcutaneous tissue, unspecified: Secondary | ICD-10-CM

## 2024-07-12 DIAGNOSIS — R21 Rash and other nonspecific skin eruption: Secondary | ICD-10-CM

## 2024-07-12 DIAGNOSIS — T148XXA Other injury of unspecified body region, initial encounter: Secondary | ICD-10-CM

## 2024-07-12 MED ORDER — FLUCONAZOLE 150 MG PO TABS
150.0000 mg | ORAL_TABLET | Freq: Every day | ORAL | 0 refills | Status: DC
  Filled 2024-07-12 – 2024-07-26 (×3): qty 1, 1d supply, fill #0

## 2024-07-12 MED ORDER — FREESTYLE LIBRE 3 PLUS SENSOR MISC
2 refills | Status: DC
  Filled 2024-07-12: qty 2, 30d supply, fill #0
  Filled 2024-08-25 – 2024-09-21 (×2): qty 2, 30d supply, fill #1
  Filled 2024-10-31: qty 2, 30d supply, fill #2
  Filled 2024-11-27 – 2024-12-11 (×2): qty 2, 30d supply, fill #3
  Filled 2024-12-22: qty 6, 90d supply, fill #3
  Filled 2024-12-23 – 2025-01-05 (×3): qty 2, 30d supply, fill #3

## 2024-07-12 MED ORDER — TRIAMCINOLONE ACETONIDE 0.025 % EX OINT
1.0000 | TOPICAL_OINTMENT | Freq: Two times a day (BID) | CUTANEOUS | 0 refills | Status: DC
Start: 2024-07-12 — End: 2024-08-15
  Filled 2024-07-12: qty 30, 15d supply, fill #0

## 2024-07-12 NOTE — Telephone Encounter (Signed)
 Dr. Georgina saw patient in office today.

## 2024-07-12 NOTE — Telephone Encounter (Signed)
 Copied from CRM #[id number]. Topic: Clinical - Medication Question >> Jul 12, 2024  1:33 PM Nathanel BROCKS wrote: Reason for CRM: pt called and has been in the hosp and been treated with strong antibiotics and has a yeast inf and she needs something called in to her pharmacy asap.  [facility] [[id number]]  Please advise,

## 2024-07-12 NOTE — Telephone Encounter (Signed)
 Copied from CRM [id number]. Topic: Clinical - Medication Question >> Jul 12, 2024  9:45 AM Antwanette L wrote: Reason for CRM: Pt is calling because she needs a refill on a freestyle libre 3 sensor. It was a sample provided by the hospital. This is not listed on the pt med list. Please contact the pt at [phone].   Pharmacy Details [facility] [address], [address] [place] [address] Phone: [phone] Fax: [phone] Hours: M-F 7:30a-7:00p

## 2024-07-12 NOTE — Progress Notes (Signed)
 Orthopedic Office Note  Patient is now 2 weeks status post left fifth toe infected hematoma incision and drainage.  She has been doing well since discharge to the hospital.  She is weightbearing in a postop shoe.  She has not had any pain in the toe.  She has not noticed any drainage around it.  On exam: Her incision is well-healed.  There is no active or expressible drainage.  There is no erythema around the toe.  There is 1 suture remaining in place.  She has a nonantalgic gait.  EHL/TA/GSC intact.  Sensation intact light touch in sural/saphenous/deep peroneal/superficial peroneal/tibial nerve distribution.  Foot warm and well-perfused.  Assessment: 53 year old female healing well after left fifth toe infected hematoma I&D  Plan: -Weightbearing as tolerated left lower extremity.  Could use a hard soled shoe as well as long as she pads the fifth toe area -Okay to let soap/water run over the incision but do not submerge -Sutures removed in office today -Continue antibiotics per ID -Return to work note provided to her today -Follow-up in office in 4 weeks, x-rays at next visit: none   Ozell DELENA Ada, MD Orthopedic Surgeon

## 2024-07-17 ENCOUNTER — Encounter: Payer: Self-pay | Admitting: Nurse Practitioner

## 2024-07-17 ENCOUNTER — Ambulatory Visit (INDEPENDENT_AMBULATORY_CARE_PROVIDER_SITE_OTHER): Payer: Self-pay | Admitting: Nurse Practitioner

## 2024-07-17 DIAGNOSIS — E1165 Type 2 diabetes mellitus with hyperglycemia: Secondary | ICD-10-CM

## 2024-07-17 NOTE — Progress Notes (Signed)
 Subjective   Patient ID: Erica [name], female    DOB: May 14, 1971, 53 y.o.   MRN: [id number]  Chief Complaint  Patient presents with   Medical Management of Chronic Issues    Referring provider: [name], NP  Sharin Altidor is a 53 y.o. female with Past Medical History: No date: Arthritis No date: Depression No date: Diabetes mellitus No date: Fatty liver No date: Hernia, abdominal No date: Hypercholesteremia No date: IBS (irritable bowel syndrome)   HPI  Patient presents today for annual exam.  She does have a history of vitamin D  deficiency and diabetes.  She does have peripheral nerve disease.  We will check labs today.  A1c in office today was 11.3.  Patient is noncompliant with medications. Will place referral to endocrinology and pharmacy for management.  Denies f/c/s, n/v/d, hemoptysis, PND, leg swelling Denies chest pain or edema  Recently had cellulitis. Has followed with ID. Is on antibiotics. Legs is improving.    No Active Allergies  Immunization History  Administered Date(s) Administered   PNEUMOCOCCAL CONJUGATE-20 05/05/2022   PPD Test 02/08/2023   Tdap 02/01/2020    Tobacco History: Social History   Tobacco Use  Smoking Status Never  Smokeless Tobacco Never   Counseling given: Not Answered   Outpatient Encounter Medications as of 07/17/2024  Medication Sig   ascorbic acid  (VITAMIN C ) 500 MG tablet Take 1 tablet (500 mg total) by mouth 2 (two) times daily.   aspirin  81 MG tablet Take 81 mg by mouth at bedtime.    atorvastatin  (LIPITOR) 40 MG tablet Take 2 tablets (80 mg total) by mouth daily.   Blood Glucose Monitoring Suppl (TRUE METRIX METER) w/Device KIT Use to check blood sugar up to twice daily.   buPROPion  (WELLBUTRIN  XL) 150 MG 24 hr tablet Take 1 tablet (150 mg total) by mouth every morning.   Continuous Glucose Sensor (FREESTYLE LIBRE 3 PLUS SENSOR) MISC Change sensor every 15 days.   escitalopram  (LEXAPRO ) 20 MG tablet Take 1  tablet (20 mg total) by mouth daily.   Glucose Blood (BLOOD GLUCOSE TEST STRIPS) STRP Use to check blood sugar up to twice daily.   insulin  glargine-yfgn (SEMGLEE ) 100 UNIT/ML Pen Inject 20 Units into the skin daily.   insulin  lispro (HUMALOG ) 100 UNIT/ML KwikPen Use it as sliding scale three times a day with every meals.  CBG 70 - 120: 0 units  CBG 121 - 150: 2 units  CBG 151 - 200: 3 units CBG 201 - 250: 5 units  CBG 251 - 300: 8 units  CBG 301 - 350: 11 units  CBG 351 - 400: 15 units   Insulin  Pen Needle (PEN NEEDLES) 31G X 8 MM MISC Use 3 (three) times daily with meals.   Lancet Device MISC Use to check blood sugar up to twice daily. May substitute to any manufacturer covered by patient's insurance.   meclizine  (ANTIVERT ) 25 MG tablet Take 1 tablet (25 mg total) by mouth 3 (three) times daily as needed for dizziness.   medroxyPROGESTERone  Acetate 150 MG/ML SUSY Inject 1 mL (150 mg total) into the muscle every 3 (three) months.   metFORMIN  (GLUCOPHAGE -XR) 500 MG 24 hr tablet Take 2 tablets (1,000 mg total) by mouth 2 (two) times daily with a meal.   Multiple Vitamin (MULTIVITAMIN) tablet Take 1 tablet by mouth daily with breakfast.   Multiple Vitamins-Minerals (HAIR SKIN NAILS) CAPS Take 2 capsules by mouth at bedtime.   omeprazole  (PRILOSEC) 20 MG capsule Take  1 capsule (20 mg total) by mouth daily.   sulfamethoxazole -trimethoprim  (BACTRIM  DS) 800-160 MG tablet Take 1 tablet by mouth 2 (two) times daily for 14 days.   traZODone  (DESYREL ) 150 MG tablet Take 1 tablet (150 mg total) by mouth at bedtime.   triamcinolone  (KENALOG ) 0.025 % ointment Apply 1 Application topically 2 (two) times daily.   valsartan -hydrochlorothiazide  (DIOVAN -HCT) 80-12.5 MG tablet Take 1 tablet by mouth daily.   zinc  sulfate, 50mg  elemental zinc , 220 (50 Zn) MG capsule Take 1 capsule (220 mg total) by mouth daily.   ergocalciferol  (VITAMIN D2) 1.25 MG (50000 UT) capsule Take 1 capsule by mouth once a week. (Patient  not taking: Reported on 06/26/2024)   ergocalciferol  (VITAMIN D2) 1.25 MG (50000 UT) capsule Take 1 capsule by mouth once a week. (Patient taking differently: Take 50,000 Units by mouth every Sunday.)   fluconazole  (DIFLUCAN ) 150 MG tablet Take 1 tablet (150 mg total) by mouth daily.   pregabalin  (LYRICA ) 150 MG capsule Take 1 capsule (150 mg total) by mouth 2 (two) times daily. (Patient taking differently: Take 150 mg by mouth 2 (two) times daily as needed (for nerve pain).)   Semaglutide , 2 MG/DOSE, (OZEMPIC , 2 MG/DOSE,) 8 MG/3ML SOPN Inject 2 mg as directed once a week. (Patient taking differently: Inject 2 mg as directed every Sunday.)   [DISCONTINUED] amitriptyline (ELAVIL) 50 MG tablet amitriptyline 50 mg tablet  Take 1 tablet(s) every day by oral route as directed for 30 days.   [DISCONTINUED] gabapentin (NEURONTIN) 300 MG capsule Take 300 mg by mouth.   [DISCONTINUED] metFORMIN  (GLUCOPHAGE ) 1000 MG tablet Take 1 tablet (1,000 mg total) by mouth 2 (two) times daily with a meal.   Facility-Administered Encounter Medications as of 07/17/2024  Medication   testosterone  cypionate (DEPOTESTOSTERONE CYPIONATE) injection 150 mg    Review of Systems  Review of Systems  Constitutional: Negative.   HENT: Negative.    Cardiovascular: Negative.   Gastrointestinal: Negative.   Allergic/Immunologic: Negative.   Neurological: Negative.   Psychiatric/Behavioral: Negative.       Objective:   BP (!) 100/57   Pulse 98   Temp 98.3 F (36.8 C) (Oral)   Wt 251 lb 6.4 oz (114 kg)   LMP  (LMP Unknown)   SpO2 98%   BMI 34.10 kg/m   Wt Readings from Last 5 Encounters:  07/17/24 251 lb 6.4 oz (114 kg)  07/10/24 248 lb (112.5 kg)  06/27/24 250 lb (113.4 kg)  05/04/24 260 lb (117.9 kg)  02/22/24 255 lb (115.7 kg)     Physical Exam Vitals and nursing note reviewed.  Constitutional:      General: She is not in acute distress.    Appearance: She is well-developed.  Cardiovascular:     Rate  and Rhythm: Normal rate and regular rhythm.  Pulmonary:     Effort: Pulmonary effort is normal.     Breath sounds: Normal breath sounds.  Neurological:     Mental Status: She is alert and oriented to person, place, and time.       Assessment & Plan:   Type 2 diabetes mellitus with hyperglycemia, without long-term current use of insulin  (HCC) -     Microalbumin / creatinine urine ratio -     Ambulatory referral to Endocrinology -     AMB Referral [facility]BCI Care Management     Return in about 3 months (around 10/17/2024).   Bascom GORMAN Borer, NP 07/17/2024

## 2024-07-18 LAB — MICROALBUMIN / CREATININE URINE RATIO
Creatinine, Urine: 192.4 mg/dL
Microalb/Creat Ratio: 24 mg/g{creat} (ref 0–29)
Microalbumin, Urine: 46.3 ug/mL

## 2024-07-19 ENCOUNTER — Telehealth: Payer: Self-pay

## 2024-07-19 NOTE — Progress Notes (Unsigned)
 Care Guide Pharmacy Note  07/19/2024 Name: Erica [name] MRN: [id number] DOB: 05-22-1971  Referred By: [name], NP Reason for referral: Complex Care Management (Outreach to schedule with pharm d )   Erica [name] is a 53 y.o. year old female who is a primary care patient of [name], NP.  Erica [name] was referred to the pharmacist for assistance related to: DMII  An unsuccessful telephone outreach was attempted today to contact the patient who was referred to the pharmacy team for assistance with medication management. Additional attempts will be made to contact the patient.  Jeoffrey Buffalo , RMA     [facility]  [facility], [facility] Guide  Direct Dial : [phone]  Website: [email or web address]

## 2024-07-20 ENCOUNTER — Other Ambulatory Visit: Payer: Self-pay

## 2024-07-20 NOTE — Progress Notes (Signed)
 Care Guide Pharmacy Note  07/20/2024 Name: Erica [name] MRN: [id number] DOB: [birth year]  Referred By: [name], NP Reason for referral: Complex Care Management (Outreach to schedule with pharm d )   Erica [name] is a 53 y.o. year old female who is a primary care patient of [name], NP.  Erica [name] was referred to the pharmacist for assistance related to: DMII  Successful contact was made with the patient to discuss pharmacy services including being ready for the pharmacist to call at least 5 minutes before the scheduled appointment time and to have medication bottles and any blood pressure readings ready for review. The patient agreed to meet with the pharmacist via telephone visit on (date/time).07/26/2024  Jeoffrey Buffalo , RMA     [place]  [facility], [facility] Guide  Direct Dial : [phone]  Website: [email or web address]

## 2024-07-21 ENCOUNTER — Other Ambulatory Visit: Payer: Self-pay

## 2024-07-24 ENCOUNTER — Other Ambulatory Visit (HOSPITAL_BASED_OUTPATIENT_CLINIC_OR_DEPARTMENT_OTHER): Payer: Self-pay

## 2024-07-24 ENCOUNTER — Other Ambulatory Visit: Payer: Self-pay | Admitting: Nurse Practitioner

## 2024-07-24 ENCOUNTER — Telehealth: Payer: Self-pay | Admitting: Nurse Practitioner

## 2024-07-24 ENCOUNTER — Other Ambulatory Visit: Payer: Self-pay

## 2024-07-24 DIAGNOSIS — F332 Major depressive disorder, recurrent severe without psychotic features: Secondary | ICD-10-CM

## 2024-07-24 DIAGNOSIS — E559 Vitamin D deficiency, unspecified: Secondary | ICD-10-CM

## 2024-07-24 DIAGNOSIS — K219 Gastro-esophageal reflux disease without esophagitis: Secondary | ICD-10-CM

## 2024-07-24 MED ORDER — BUPROPION HCL ER (XL) 150 MG PO TB24
150.0000 mg | ORAL_TABLET | Freq: Every morning | ORAL | 2 refills | Status: DC
  Filled 2024-07-24 – 2024-07-25 (×2): qty 30, 30d supply, fill #0
  Filled 2024-08-21: qty 30, 30d supply, fill #1
  Filled 2024-10-23: qty 30, 30d supply, fill #2

## 2024-07-24 MED ORDER — OMEPRAZOLE 20 MG PO CPDR
20.0000 mg | DELAYED_RELEASE_CAPSULE | Freq: Every day | ORAL | 3 refills | Status: DC
  Filled 2024-07-24 – 2024-07-25 (×2): qty 30, 30d supply, fill #0
  Filled 2024-08-21: qty 30, 30d supply, fill #1
  Filled 2024-10-23: qty 30, 30d supply, fill #2
  Filled 2024-11-17: qty 30, 30d supply, fill #3

## 2024-07-24 NOTE — Telephone Encounter (Signed)
 Copied from CRM #[id number]. Topic: Referral - Request for Referral >> Jul 24, 2024 11:02 AM Tobias CROME wrote: Did the patient discuss referral with their provider in the last year? Yes (If No - schedule appointment) (If Yes - send message)  Appointment offered? No  Type of order/referral and detailed reason for visit: Gastroenterology for colonoscopy  Preference of office, provider, location: [facility]  If referral order, have you been seen by this specialty before? Yes, [facility] in [place].   Can we respond through MyChart? Yes

## 2024-07-25 ENCOUNTER — Other Ambulatory Visit (HOSPITAL_BASED_OUTPATIENT_CLINIC_OR_DEPARTMENT_OTHER): Payer: Self-pay

## 2024-07-25 ENCOUNTER — Other Ambulatory Visit: Payer: Self-pay

## 2024-07-26 ENCOUNTER — Other Ambulatory Visit (HOSPITAL_COMMUNITY): Payer: Self-pay

## 2024-07-26 ENCOUNTER — Other Ambulatory Visit (HOSPITAL_BASED_OUTPATIENT_CLINIC_OR_DEPARTMENT_OTHER): Payer: Self-pay

## 2024-07-26 ENCOUNTER — Other Ambulatory Visit (INDEPENDENT_AMBULATORY_CARE_PROVIDER_SITE_OTHER): Payer: Self-pay

## 2024-07-26 ENCOUNTER — Other Ambulatory Visit: Payer: Self-pay

## 2024-07-26 DIAGNOSIS — E1165 Type 2 diabetes mellitus with hyperglycemia: Secondary | ICD-10-CM

## 2024-07-26 MED ORDER — MEDROXYPROGESTERONE ACETATE 150 MG/ML IM SUSY
150.0000 mg | PREFILLED_SYRINGE | INTRAMUSCULAR | 2 refills | Status: AC
  Filled 2024-07-26: qty 1, 90d supply, fill #0
  Filled 2024-10-23: qty 1, 90d supply, fill #1
  Filled 2025-01-16: qty 1, 90d supply, fill #2

## 2024-07-26 NOTE — Progress Notes (Signed)
 07/26/2024 Name: Erica [name] MRN: [id number] DOB: 1971-01-04  Chief Complaint  Patient presents with   Diabetes    Erica [name] is a 53 y.o. year old female who presented for a telephone visit.   They were referred to the pharmacist by their PCP for assistance in managing diabetes.  PMH includes HTN, T2DM, HLD, depression.   Subjective: Patient was last seen by PCP, [name], NP, on 07/18/24. At last visit, A1C continued to be elevated at 11.3%. Patient reported not taking her medications as prescribed. She was recently hospitalized in June 2025 for cellulitis and she is now following with ID. She was discharged from the hospital on basal insulin  + Humalog  per sliding scale, in addition to her Ozempic  and metformin .   Today, patient reports doing ok. She only has a limited time to talk due to being at work. She feels like this appointment may be repeptitive from all the discussion about her diabetes with her PCP and while she was in the hospital. She reports that she has been taking her medications as prescribed, but admits to sometimes forgetting doses. She did have one episode of hypoglycemia recently - described below.    Care Team: Primary Care Provider: Borer Bascom RAMAN, NP ; Next Scheduled Visit: 10/18/24  Medication Access/Adherence  Current Pharmacy:  [facility] - [facility] [address] [place] [address] Phone: [phone] Fax: [phone]   Patient reports affordability concerns with their medications: No  Patient reports access/transportation concerns to their pharmacy: No  Patient reports adherence concerns with their medications:  Yes  - patient admits to missing oral medications multiple times per week. She reports skipping insulin  one day due to hypoglycemia.    Diabetes:  Current medications: insulin  glargine 20 units daily, metformin  XR 1000 mg BID with meals, Ozempic  2 mg weekly (Sundays), Humalog   TID with meals per sliding scale (reports she has not been taking this)  Current glucose readings:  Freestyle Libre 3 CGM - $15/1 mo supply - reports this will be affordable for her       Patient reports hypoglycemic s/sx including dizziness, shakiness, sweating - she had one BG as low as 52 mg/dL with nausea and lightheadedness. It took 2 days to fully recover. She skipped insulin  that day.   Patient denies hyperglycemic symptoms including polyuria, polydipsia, polyphagia, nocturia, neuropathy, blurred vision.  Current meal patterns: did not discuss at length today. Reports her appetite is variable and she does not eat at consistent times.   Current physical activity: did not discuss today  Current medication access support: none  Hyperlipidemia/ASCVD Risk Reduction  Current lipid lowering medications: atorvastatin  40 mg daily  Clinical ASCVD: No  The 10-year ASCVD risk score (Arnett DK, et al., 2019) is: 6.2%   Values used to calculate the score:     Age: 47 years     Clincally relevant sex: Female     Is Non-Hispanic African American: Yes     Diabetic: Yes     Tobacco smoker: No     Systolic Blood Pressure: 100 mmHg     Is BP treated: Yes     HDL Cholesterol: 32 mg/dL     Total Cholesterol: 183 mg/dL   Objective:  BP Readings from Last 3 Encounters:  07/17/24 (!) 100/57  07/10/24 114/78  06/29/24 (!) 142/74    Lab Results  Component Value Date   HGBA1C 11.3 (H) 06/26/2024   HGBA1C 11.1 (A) 02/17/2024  HGBA1C 11.4 (H) 02/07/2024       Latest Ref Rng & Units 07/10/2024    3:33 PM 06/29/2024    4:45 AM 06/27/2024    4:34 AM  BMP  Glucose 65 - 99 mg/dL 806  806  769   BUN 7 - 25 mg/dL 6  <5  <5   Creatinine 0.50 - 1.03 mg/dL 9.33  9.38  9.36   BUN/Creat Ratio 6 - 22 (calc) 9     Sodium 135 - 146 mmol/L 137  138  138   Potassium 3.5 - 5.3 mmol/L 4.2  3.8  3.9   Chloride 98 - 110 mmol/L 101  105  108   CO2 20 - 32 mmol/L 22  24  21    Calcium  8.6 - 10.4  mg/dL 9.6  8.7  8.4     Lab Results  Component Value Date   CHOL 183 02/22/2024   HDL 32 (L) 02/22/2024   LDLCALC 96 02/22/2024   TRIG 329 (H) 02/22/2024   CHOLHDL 5.7 (H) 02/22/2024    Medications Reviewed Today     Reviewed by Brinda Lorain SQUIBB, RPH (Pharmacist) on 07/26/24 at 1619  Med List Status: <None>   Medication Order Taking? Sig Documenting Provider Last Dose Status Informant    Discontinued 11/10/20 1306            Med Note (MCKEOWN, GUSTAV JONELLE Repress Nov 10, 2020 12:39 PM) Pt does not take  ascorbic acid  (VITAMIN C ) 500 MG tablet [id number]  Take 1 tablet (500 mg total) by mouth 2 (two) times daily. Akula, Vijaya, MD  Active   aspirin  81 MG tablet [id number]  Take 81 mg by mouth at bedtime.  [provider]  Active Self  atorvastatin  (LIPITOR) 40 MG tablet [id number] Yes Take 2 tablets (80 mg total) by mouth daily. [name], NP  Active Self  Blood Glucose Monitoring Suppl (TRUE METRIX METER) w/Device KIT [id number]  Use to check blood sugar up to twice daily. [name], NP  Active            Med Note STEFFI, ALEXANDRIA   Mon Jun 26, 2024 10:10 AM)    buPROPion  (WELLBUTRIN  XL) 150 MG 24 hr tablet [id number] Yes Take 1 tablet (150 mg total) by mouth every morning. [name], NP  Active   Continuous Glucose Sensor (FREESTYLE LIBRE 3 PLUS SENSOR) [place] [id number] Yes Change sensor every 15 days. [name], NP  Active   ergocalciferol  (VITAMIN D2) 1.25 MG (50000 UT) capsule [id number] Yes Take 1 capsule by mouth once a week. [name], NP  Active Self  ergocalciferol  (VITAMIN D2) 1.25 MG (50000 UT) capsule [id number] Yes Take 1 capsule by mouth once a week. [name], NP  Active Self  escitalopram  (LEXAPRO ) 20 MG tablet [id number] Yes Take 1 tablet (20 mg total) by mouth daily. [name], NP  Active Self  fluconazole  (DIFLUCAN ) 150 MG tablet [id number]  Take 1 tablet (150 mg total) by mouth daily. [name], NP  Active      Discontinued 11/10/20 1306            Med Note RUSH, GUSTAV JONELLE Repress Nov 10, 2020 12:38 PM) Pt reports she does not take  Glucose Blood (BLOOD GLUCOSE TEST STRIPS) STRP [id number]  Use to check blood sugar up to twice daily. [name], NP  Active  Med Note MARISA, NATHANEL SAILOR   Mon Jun 26, 2024  4:41 PM)    insulin  glargine-yfgn (SEMGLEE ) 100 UNIT/ML Pen [id number] Yes Inject 20 Units into the skin daily. Akula, Vijaya, MD  Active   insulin  lispro (HUMALOG ) 100 UNIT/ML KwikPen [id number] Yes Use it as sliding scale three times a day with every meals.  CBG 70 - 120: 0 units  CBG 121 - 150: 2 units  CBG 151 - 200: 3 units CBG 201 - 250: 5 units  CBG 251 - 300: 8 units  CBG 301 - 350: 11 units  CBG 351 - 400: 15 units Akula, Vijaya, MD  Active   Insulin  Pen Needle (PEN NEEDLES) 31G X 8 MM MISC [id number]  Use 3 (three) times daily with meals. Cherlyn Labella, MD  Active   Lancet Device MISC [id number]  Use to check blood sugar up to twice daily. May substitute to any manufacturer covered by patient's insurance. [name], NP  Active            Med Note MARISA, NATHANEL SAILOR   Mon Jun 26, 2024  4:41 PM)    meclizine  (ANTIVERT ) 25 MG tablet [id number]  Take 1 tablet (25 mg total) by mouth 3 (three) times daily as needed for dizziness. Ula Prentice SAUNDERS, MD  Active Self  medroxyPROGESTERone  Acetate 150 MG/ML SUSY [id number]  Inject 1 mL (150 mg total) into the muscle every 3 (three) months. Zina Jerilynn LABOR, MD  Active     Discontinued 09/30/23 1422   metFORMIN  (GLUCOPHAGE -XR) 500 MG 24 hr tablet [id number] Yes Take 2 tablets (1,000 mg total) by mouth 2 (two) times daily with a meal. [name], NP  Active Self           Med Note MARISA, NATHANEL SAILOR   Mon Jun 26, 2024  4:41 PM)    Multiple Vitamin (MULTIVITAMIN) tablet [id number]  Take 1 tablet by mouth daily with breakfast. [provider]  Active Self  Multiple Vitamins-Minerals (HAIR SKIN NAILS) CAPS [id number]  Take 2 capsules by  mouth at bedtime. [provider]  Active Self  omeprazole  (PRILOSEC) 20 MG capsule [id number] Yes Take 1 capsule (20 mg total) by mouth daily. [name], NP  Active   pregabalin  (LYRICA ) 150 MG capsule [id number]  Take 1 capsule (150 mg total) by mouth 2 (two) times daily.  Patient taking differently: Take 150 mg by mouth 2 (two) times daily as needed (for nerve pain).   [name], NP  Active Self  Semaglutide , 2 MG/DOSE, (OZEMPIC , 2 MG/DOSE,) 8 MG/3ML SOPN [id number] Yes Inject 2 mg as directed once a week. [name], NP  Active Self  sulfamethoxazole -trimethoprim  (BACTRIM  DS) 800-160 MG tablet [id number]  Take 1 tablet by mouth 2 (two) times daily for 14 days. Melvenia Corean SAILOR, NP  Active   testosterone  cypionate (DEPOTESTOSTERONE CYPIONATE) injection 150 mg [id number]   Nichols, Tonya S, NP  Active   traZODone  (DESYREL ) 150 MG tablet [id number]  Take 1 tablet (150 mg total) by mouth at bedtime. [name], NP  Active Self  triamcinolone  (KENALOG ) 0.025 % ointment [id number]  Apply 1 Application topically 2 (two) times daily. [name], NP  Active   valsartan -hydrochlorothiazide  (DIOVAN -HCT) 80-12.5 MG tablet [id number] Yes Take 1 tablet by mouth daily. [name], NP  Active Self  zinc  sulfate, 50mg  elemental zinc , 220 (50 Zn) MG capsule [id number]  Take 1 capsule (220 mg total) by mouth daily. Akula,  Elgie, MD  Active               Assessment/Plan:   Diabetes: - Currently uncontrolled with most recent A1C of 11.3% above goal <7% in the setting of medication nonadherence. However, BG are much improved recently while she has been wearing the FL3 CGM with recent TIR over the past 14 days of 69%, close to goal > 70%. Patient commended for improvement. Discussed that consistent adherence is the key to managing her BG. Discussed if her poor appetite continues to be an issue, we can transition her from Ozempic  2 mg to 1 mg weekly and adjust her insulin   as needed if glycemic control worsens. Feel it is appropriate for her to continue basal insulin  alone, without sliding scale Humalog  with meals given recent glycemic control per CGM monitoring.  - Last UACR August 2025 - 24 mg/g - Reviewed long term cardiovascular and renal outcomes of uncontrolled blood sugar - Reviewed goal A1c, goal fasting, and goal 2 hour post prandial glucose - Reviewed hypoglycemia management plan and the rule of 15 - Recommend to continue Ozempic  2 mg weekly - Recommend to continue insulin  glargine 20 units daily - Recommend to continue metformin  XR 1000 mg BID - Recommend to hold Humalog  TID with meals per SSI and focus on adherence to other three agents. Will consider restarting at a set dose pending CGM trends.  - Recommend to check glucose continuously using FL3 CGM. Connected patient to [facility] today. - Next A1C due Oct 2025      Hyperlipidemia/ASCVD Risk Reduction: - Currently uncontrolled with most recent LDL-C of 96 mg/dL above goal < 70 mg/dL given U7If + comorbidities. TG also elevated > 300 mg/dL, likely in the setting of poor glycemic control. Expect improvement if she is able to remain adherent to her medications. High intensity statin indicated. - Reviewed long term complications of uncontrolled cholesterol - Recommend to continue atorvastatin  40 mg daily    Patient reported that she continues to have s/sx consistent with a yeast infection. She was not aware that Rx for fluconazole  was sent to [facility] on 07/12/24. She has other meds ready for pick up at [facility] - so collaborated with pharmacist to fill there today.   Patient verbalized understanding of treatment plan.   Follow Up Plan:  Pharmacist 08/25/24 PCP clinic visit 10/18/24   Lorain Baseman, PharmD [facility] [phone]

## 2024-07-27 ENCOUNTER — Ambulatory Visit (INDEPENDENT_AMBULATORY_CARE_PROVIDER_SITE_OTHER): Payer: Self-pay | Admitting: Orthopedic Surgery

## 2024-07-27 ENCOUNTER — Other Ambulatory Visit: Payer: Self-pay

## 2024-07-27 ENCOUNTER — Other Ambulatory Visit (HOSPITAL_BASED_OUTPATIENT_CLINIC_OR_DEPARTMENT_OTHER): Payer: Self-pay

## 2024-07-27 DIAGNOSIS — L84 Corns and callosities: Secondary | ICD-10-CM

## 2024-07-27 NOTE — Progress Notes (Signed)
 Orthopedic Office Note  Patient comes in today for increased callus formation around her left fifth toe.  She says she has been trying to pad the area and keep pressure off of the area but has noticed increased callus formation around the left fifth toe.  She has not had any drainage around it.  She is not having any pain in the area.  However, she has significant neuropathy.  She says she has been working on her blood sugars.  On exam, there is increased callus formation around the lateral aspect of the fifth toe.  Her prior surgical wound has nearly completely healed.  There is a still a small area with healthy red tissue in the base of it.  There is no active or expressible drainage from that area.  The area does not probe deep.  There is no erythema around her fifth toe.  There is no palpable fluctuance.  No hematoma seen.   The callus was debrided today in the office down to the level of healthy appearing skin.  Patient can be weightbearing as tolerated.  I stressed the importance of offloading the area and keeping pressure off of her fifth toe to prevent further issues.  I also explained control her blood sugars would be important going forward.  Since she does have significant neuropathy, she is at risk of ulceration and developing diabetic foot complications.  She needs to be careful diabetic ulcers can lead to amputations.  Patient should come back to the office in 1 month for repeat check.  Erica DELENA Ada, MD Orthopedic Surgeon

## 2024-07-31 ENCOUNTER — Other Ambulatory Visit: Payer: Self-pay | Admitting: Nurse Practitioner

## 2024-07-31 DIAGNOSIS — Z1211 Encounter for screening for malignant neoplasm of colon: Secondary | ICD-10-CM

## 2024-08-03 ENCOUNTER — Other Ambulatory Visit: Payer: Self-pay

## 2024-08-04 ENCOUNTER — Ambulatory Visit: Payer: Self-pay

## 2024-08-05 ENCOUNTER — Other Ambulatory Visit (HOSPITAL_BASED_OUTPATIENT_CLINIC_OR_DEPARTMENT_OTHER): Payer: Self-pay

## 2024-08-08 ENCOUNTER — Ambulatory Visit (INDEPENDENT_AMBULATORY_CARE_PROVIDER_SITE_OTHER): Payer: Self-pay

## 2024-08-08 DIAGNOSIS — Z3202 Encounter for pregnancy test, result negative: Secondary | ICD-10-CM

## 2024-08-08 DIAGNOSIS — Z789 Other specified health status: Secondary | ICD-10-CM

## 2024-08-08 MED ORDER — MEDROXYPROGESTERONE ACETATE 150 MG/ML IM SUSP
150.0000 mg | Freq: Once | INTRAMUSCULAR | Status: AC
  Administered 2024-08-08: 150 mg via INTRAMUSCULAR

## 2024-08-08 NOTE — Progress Notes (Signed)
 Pt came in today for Depo injection. Pt supplied medication. Pt was not on schedule and pregnancy test was done to confirm she was not pregnant. Negative test and injection was given right glut.   Suzen Shove   CMA II

## 2024-08-15 ENCOUNTER — Other Ambulatory Visit (HOSPITAL_BASED_OUTPATIENT_CLINIC_OR_DEPARTMENT_OTHER): Payer: Self-pay

## 2024-08-15 ENCOUNTER — Other Ambulatory Visit: Payer: Self-pay | Admitting: Nurse Practitioner

## 2024-08-15 ENCOUNTER — Other Ambulatory Visit (HOSPITAL_COMMUNITY): Payer: Self-pay

## 2024-08-15 ENCOUNTER — Encounter (HOSPITAL_COMMUNITY): Payer: Self-pay

## 2024-08-15 ENCOUNTER — Other Ambulatory Visit: Payer: Self-pay

## 2024-08-15 DIAGNOSIS — R21 Rash and other nonspecific skin eruption: Secondary | ICD-10-CM

## 2024-08-15 MED ORDER — TRIAMCINOLONE ACETONIDE 0.025 % EX OINT
1.0000 | TOPICAL_OINTMENT | Freq: Two times a day (BID) | CUTANEOUS | 0 refills | Status: AC
  Filled 2024-08-15: qty 30, 15d supply, fill #0

## 2024-08-16 ENCOUNTER — Other Ambulatory Visit (HOSPITAL_BASED_OUTPATIENT_CLINIC_OR_DEPARTMENT_OTHER): Payer: Self-pay

## 2024-08-16 ENCOUNTER — Ambulatory Visit: Payer: Self-pay | Admitting: *Deleted

## 2024-08-16 ENCOUNTER — Other Ambulatory Visit (HOSPITAL_COMMUNITY): Payer: Self-pay

## 2024-08-16 ENCOUNTER — Ambulatory Visit (INDEPENDENT_AMBULATORY_CARE_PROVIDER_SITE_OTHER): Admitting: Nurse Practitioner

## 2024-08-16 ENCOUNTER — Other Ambulatory Visit: Payer: Self-pay

## 2024-08-16 ENCOUNTER — Encounter: Payer: Self-pay | Admitting: Nurse Practitioner

## 2024-08-16 VITALS — BP 131/82 | HR 92 | Ht 72.0 in | Wt 252.0 lb

## 2024-08-16 DIAGNOSIS — R21 Rash and other nonspecific skin eruption: Secondary | ICD-10-CM | POA: Diagnosis not present

## 2024-08-16 DIAGNOSIS — E1165 Type 2 diabetes mellitus with hyperglycemia: Secondary | ICD-10-CM | POA: Diagnosis not present

## 2024-08-16 MED ORDER — LANTUS SOLOSTAR 100 UNIT/ML ~~LOC~~ SOPN
20.0000 [IU] | PEN_INJECTOR | Freq: Every day | SUBCUTANEOUS | 99 refills | Status: DC
  Filled 2024-08-16 (×3): qty 15, 75d supply, fill #0

## 2024-08-16 MED ORDER — TRIAMCINOLONE ACETONIDE 0.025 % EX OINT
1.0000 | TOPICAL_OINTMENT | Freq: Two times a day (BID) | CUTANEOUS | 0 refills | Status: DC
  Filled 2024-09-19: qty 454, 30d supply, fill #0

## 2024-08-16 MED ORDER — FREESTYLE LIBRE 2 PLUS SENSOR MISC
2 refills | Status: DC
  Filled 2024-08-16: qty 1, 15d supply, fill #0

## 2024-08-16 MED ORDER — FLUCONAZOLE 150 MG PO TABS
150.0000 mg | ORAL_TABLET | Freq: Once | ORAL | 0 refills | Status: AC
  Filled 2024-08-16: qty 1, 1d supply, fill #0

## 2024-08-16 NOTE — Progress Notes (Signed)
 Subjective   Patient ID: Erica [name], female    DOB: Oct 31, 1971, 53 y.o.   MRN: [id number]  Chief Complaint  Patient presents with   Headache    Off and on for several months   Blood Sugar Problem    Sensor came off, unsure of current BG levels, does not check manually   Hypertension    Concerned about elevated BP, does not check at home    Referring provider: [name], NP  Erica [name] is a 53 y.o. female with Past Medical History: No date: Arthritis No date: Depression No date: Diabetes mellitus No date: Fatty liver No date: Hernia, abdominal No date: Hypercholesteremia No date: IBS (irritable bowel syndrome)   HPI  Patient presents today for an acute visit.  She is concerned that either her blood sugars or blood pressure have been high because she has been having headaches and dizzy spells.  She is not currently checking blood sugars or blood pressures at home.  We will check labs today.  Vital signs are stable in office today.  Patient also needs a refill on triamcinolone  cream.  She would like a referral back to dermatology for rash to scalp that is chronic.  She does need Diflucan  because she feels like she has a yeast infection.  Patient is currently out of Semglee  and pharmacy is requesting that this be switched to Lantus .  Will send in a prescription for this today.  Denies f/c/s, n/v/d, hemoptysis, PND, leg swelling Denies chest pain or edema     No Active Allergies  Immunization History  Administered Date(s) Administered   PNEUMOCOCCAL CONJUGATE-20 05/05/2022   PPD Test 02/08/2023   Tdap 02/01/2020    Tobacco History: Social History   Tobacco Use  Smoking Status Never  Smokeless Tobacco Never   Counseling given: Not Answered   Outpatient Encounter Medications as of 08/16/2024  Medication Sig   ascorbic acid  (VITAMIN C ) 500 MG tablet Take 1 tablet (500 mg total) by mouth 2 (two) times daily.   aspirin  81 MG tablet Take 81 mg by mouth at  bedtime.    atorvastatin  (LIPITOR) 40 MG tablet Take 2 tablets (80 mg total) by mouth daily.   Blood Glucose Monitoring Suppl (TRUE METRIX METER) w/Device KIT Use to check blood sugar up to twice daily.   buPROPion  (WELLBUTRIN  XL) 150 MG 24 hr tablet Take 1 tablet (150 mg total) by mouth every morning.   Continuous Glucose Sensor (FREESTYLE LIBRE 2 PLUS SENSOR) MISC Change sensor every 15 days.   Continuous Glucose Sensor (FREESTYLE LIBRE 3 PLUS SENSOR) MISC Change sensor every 15 days.   ergocalciferol  (VITAMIN D2) 1.25 MG (50000 UT) capsule Take 1 capsule by mouth once a week.   ergocalciferol  (VITAMIN D2) 1.25 MG (50000 UT) capsule Take 1 capsule by mouth once a week.   escitalopram  (LEXAPRO ) 20 MG tablet Take 1 tablet (20 mg total) by mouth daily.   fluconazole  (DIFLUCAN ) 150 MG tablet Take 1 tablet (150 mg total) by mouth once for 1 dose.   Glucose Blood (BLOOD GLUCOSE TEST STRIPS) STRP Use to check blood sugar up to twice daily.   insulin  glargine (LANTUS  SOLOSTAR) 100 UNIT/ML Solostar Pen Inject 20 Units into the skin daily.   insulin  glargine-yfgn (SEMGLEE ) 100 UNIT/ML Pen Inject 20 Units into the skin daily.   insulin  lispro (HUMALOG ) 100 UNIT/ML KwikPen Use it as sliding scale three times a day with every meals.  CBG 70 - 120: 0 units  CBG  121 - 150: 2 units  CBG 151 - 200: 3 units CBG 201 - 250: 5 units  CBG 251 - 300: 8 units  CBG 301 - 350: 11 units  CBG 351 - 400: 15 units   Insulin  Pen Needle (PEN NEEDLES) 31G X 8 MM MISC Use 3 (three) times daily with meals.   Lancet Device MISC Use to check blood sugar up to twice daily. May substitute to any manufacturer covered by patient's insurance.   meclizine  (ANTIVERT ) 25 MG tablet Take 1 tablet (25 mg total) by mouth 3 (three) times daily as needed for dizziness.   medroxyPROGESTERone  Acetate 150 MG/ML SUSY Inject 1 mL (150 mg total) into the muscle every 3 (three) months.   metFORMIN  (GLUCOPHAGE -XR) 500 MG 24 hr tablet Take 2  tablets (1,000 mg total) by mouth 2 (two) times daily with a meal.   Multiple Vitamin (MULTIVITAMIN) tablet Take 1 tablet by mouth daily with breakfast.   Multiple Vitamins-Minerals (HAIR SKIN NAILS) CAPS Take 2 capsules by mouth at bedtime.   omeprazole  (PRILOSEC) 20 MG capsule Take 1 capsule (20 mg total) by mouth daily.   pregabalin  (LYRICA ) 150 MG capsule Take 1 capsule (150 mg total) by mouth 2 (two) times daily. (Patient taking differently: Take 150 mg by mouth 2 (two) times daily as needed (for nerve pain).)   Semaglutide , 2 MG/DOSE, (OZEMPIC , 2 MG/DOSE,) 8 MG/3ML SOPN Inject 2 mg as directed once a week.   traZODone  (DESYREL ) 150 MG tablet Take 1 tablet (150 mg total) by mouth at bedtime.   valsartan -hydrochlorothiazide  (DIOVAN -HCT) 80-12.5 MG tablet Take 1 tablet by mouth daily.   zinc  sulfate, 50mg  elemental zinc , 220 (50 Zn) MG capsule Take 1 capsule (220 mg total) by mouth daily.   [DISCONTINUED] fluconazole  (DIFLUCAN ) 150 MG tablet Take 1 tablet (150 mg total) by mouth daily.   [DISCONTINUED] triamcinolone  (KENALOG ) 0.025 % ointment Apply 1 Application topically 2 (two) times daily.   triamcinolone  (KENALOG ) 0.025 % ointment Apply 1 Application topically 2 (two) times daily.   [DISCONTINUED] amitriptyline (ELAVIL) 50 MG tablet amitriptyline 50 mg tablet  Take 1 tablet(s) every day by oral route as directed for 30 days.   [DISCONTINUED] gabapentin (NEURONTIN) 300 MG capsule Take 300 mg by mouth.   [DISCONTINUED] metFORMIN  (GLUCOPHAGE ) 1000 MG tablet Take 1 tablet (1,000 mg total) by mouth 2 (two) times daily with a meal.   Facility-Administered Encounter Medications as of 08/16/2024  Medication   testosterone  cypionate (DEPOTESTOSTERONE CYPIONATE) injection 150 mg    Review of Systems  Review of Systems  Constitutional: Negative.   HENT: Negative.    Cardiovascular: Negative.   Gastrointestinal: Negative.   Allergic/Immunologic: Negative.   Neurological: Negative.    Psychiatric/Behavioral: Negative.       Objective:   BP 131/82   Pulse 92   Ht 6' (1.829 m)   Wt 252 lb (114.3 kg)   SpO2 98%   BMI 34.18 kg/m   Wt Readings from Last 5 Encounters:  08/16/24 252 lb (114.3 kg)  07/17/24 251 lb 6.4 oz (114 kg)  07/10/24 248 lb (112.5 kg)  06/27/24 250 lb (113.4 kg)  05/04/24 260 lb (117.9 kg)     Physical Exam Vitals and nursing note reviewed.  Constitutional:      General: She is not in acute distress.    Appearance: She is well-developed.  Cardiovascular:     Rate and Rhythm: Regular rhythm.  Pulmonary:     Effort: Pulmonary effort is normal.  Breath sounds: Normal breath sounds.  Neurological:     Mental Status: She is alert and oriented to person, place, and time.       Assessment & Plan:   Type 2 diabetes mellitus with hyperglycemia, without long-term current use of insulin  (HCC) -     Lantus  SoloStar; Inject 20 Units into the skin daily.  Dispense: 15 mL; Refill: PRN -     CBC -     Comprehensive metabolic panel with GFR -     FreeStyle Libre 2 Plus Sensor; Change sensor every 15 days.  Dispense: 1 each; Refill: 2  Rash and other nonspecific skin eruption -     Triamcinolone  Acetonide; Apply 1 Application topically 2 (two) times daily.  Dispense: 454 g; Refill: 0 -     Ambulatory referral to Dermatology  Other orders -     Fluconazole ; Take 1 tablet (150 mg total) by mouth once for 1 dose.  Dispense: 1 tablet; Refill: 0     Return if symptoms worsen or fail to improve.   Bascom GORMAN Borer, NP 08/16/2024

## 2024-08-16 NOTE — Telephone Encounter (Signed)
 FYI Only or Action Required?: Action required by provider: request for appointment, medication refill request, and sample of Libre sensor if possible.  Patient was last seen in primary care on 07/17/2024 by [name], NP.  Called Nurse Triage reporting Headache.  Symptoms began today.  Interventions attempted: Rest, hydration, or home remedies.  Symptoms are: gradually worsening.  Triage Disposition: See Physician Within 24 Hours  Patient/caregiver understands and will follow disposition?: Unsure  Please seen message from pharmacy 08/15/24 regarding Semglee  not being in stock.         Copied from CRM #[id number]. Topic: Clinical - Red Word Triage >> Aug 16, 2024 10:10 AM Erica [name] wrote: [facility] that prompted transfer to Nurse Triage: Patient states she has a headache and is lightheaded. Third time this has happened recently. States she is new to taking insulin . Reason for Disposition  [1] MODERATE headache (e.g., interferes with normal activities) AND [2] present > 24 hours AND [3] unexplained  (Exceptions: Pain medicines not tried, typical migraine, or headache part of viral illness.)  Answer Assessment - Initial Assessment Questions No available appt with PCP until 09/11/24. Recommended UC or mobile bus to check BP and blood sugar. Patient reports she does not have BP monitor that works and her Herlene sensors give out and pharmacy reports her sensors are being discontinued and needs Rx for new Valders sensor. Patient requesting if any samples of sensors can be given. Patient also reports she is out of insulin  due to pharmacy will not get insulin  until end of month. Patient has multiple issues with management and medication to treat DM . Please advise . Patient requesting a call back.       1. LOCATION: Where does it hurt?      Front  2. ONSET: When did the headache start? (e.g., minutes, hours, days)      This am 3:30 3. PATTERN: Does the pain come and go, or has it  been constant since it started?     Constant dull headache  4. SEVERITY: How bad is the pain? and What does it keep you from doing?  (e.g., Scale 1-10; mild, moderate, or severe)     Moderate  5. RECURRENT SYMPTOM: Have you ever had headaches before? If Yes, ask: When was the last time? and What happened that time?      Yes  6. CAUSE: What do you think is causing the headache?     Not sure possible elevated BP or high blood sugar 7. MIGRAINE: Have you been diagnosed with migraine headaches? If Yes, ask: Is this headache similar?      na 8. HEAD INJURY: Has there been any recent injury to your head?      Na  9. OTHER SYMPTOMS: Do you have any other symptoms? (e.g., fever, stiff neck, eye pain, sore throat, cold symptoms)     Frontal headache , lightheaded now comes and goes standing, turning change in head position  causes dizziness to worsen.  10. PREGNANCY: Is there any chance you are pregnant? When was your last menstrual period?       na  Protocols used: Headache-A-AH

## 2024-08-17 ENCOUNTER — Ambulatory Visit: Payer: Self-pay | Admitting: Nurse Practitioner

## 2024-08-17 ENCOUNTER — Other Ambulatory Visit (HOSPITAL_BASED_OUTPATIENT_CLINIC_OR_DEPARTMENT_OTHER): Payer: Self-pay

## 2024-08-17 LAB — COMPREHENSIVE METABOLIC PANEL WITH GFR
ALT: 20 [IU]/L (ref 0–32)
AST: 14 [IU]/L (ref 0–40)
Albumin: 4 g/dL (ref 3.8–4.9)
Alkaline Phosphatase: 96 [IU]/L (ref 44–121)
BUN/Creatinine Ratio: 11 (ref 9–23)
BUN: 8 mg/dL (ref 6–24)
Bilirubin Total: 0.7 mg/dL (ref 0.0–1.2)
CO2: 21 mmol/L (ref 20–29)
Calcium: 9.7 mg/dL (ref 8.7–10.2)
Chloride: 98 mmol/L (ref 96–106)
Creatinine, Ser: 0.75 mg/dL (ref 0.57–1.00)
Globulin, Total: 2.6 g/dL (ref 1.5–4.5)
Glucose: 260 mg/dL — ABNORMAL HIGH (ref 70–99)
Potassium: 4.1 mmol/L (ref 3.5–5.2)
Sodium: 137 mmol/L (ref 134–144)
Total Protein: 6.6 g/dL (ref 6.0–8.5)
eGFR: 96 mL/min/{1.73_m2}

## 2024-08-17 LAB — CBC
Hematocrit: 39.4 % (ref 34.0–46.6)
Hemoglobin: 12.3 g/dL (ref 11.1–15.9)
MCH: 27.6 pg (ref 26.6–33.0)
MCHC: 31.2 g/dL — ABNORMAL LOW (ref 31.5–35.7)
MCV: 89 fL (ref 79–97)
Platelets: 366 x10E3/uL (ref 150–450)
RBC: 4.45 x10E6/uL (ref 3.77–5.28)
RDW: 12.4 % (ref 11.7–15.4)
WBC: 6.5 x10E3/uL (ref 3.4–10.8)

## 2024-08-18 ENCOUNTER — Telehealth: Payer: Self-pay | Admitting: Orthopedic Surgery

## 2024-08-18 NOTE — Telephone Encounter (Signed)
 I called and lmom for her to let her know I could work her in on Wed 08/30/24 @ 945. If that did not work then it would the next available appt which would be October. (Appt should have been made the day of the last appt and she did not stop)

## 2024-08-18 NOTE — Telephone Encounter (Signed)
 Patient called and said she needs an appointment for 4 wks starting from the 8/14 f/u from surgery. She  stated that Georgina told her she didn't have to come back unless she needs. She said her callus start coming back. CB#[phone]

## 2024-08-21 ENCOUNTER — Other Ambulatory Visit: Payer: Self-pay

## 2024-08-22 ENCOUNTER — Other Ambulatory Visit: Payer: Self-pay

## 2024-08-23 ENCOUNTER — Other Ambulatory Visit: Payer: Self-pay

## 2024-08-25 ENCOUNTER — Other Ambulatory Visit (HOSPITAL_COMMUNITY): Payer: Self-pay

## 2024-08-25 ENCOUNTER — Other Ambulatory Visit: Payer: Self-pay

## 2024-08-25 ENCOUNTER — Other Ambulatory Visit (HOSPITAL_BASED_OUTPATIENT_CLINIC_OR_DEPARTMENT_OTHER): Payer: Self-pay

## 2024-08-25 DIAGNOSIS — E1165 Type 2 diabetes mellitus with hyperglycemia: Secondary | ICD-10-CM

## 2024-08-25 MED ORDER — PEN NEEDLES 32G X 4 MM MISC
3 refills | Status: AC
  Filled 2024-08-25: qty 100, fill #0
  Filled 2024-11-01 – 2024-11-16 (×3): qty 100, 30d supply, fill #0
  Filled 2024-12-14 – 2024-12-26 (×2): qty 100, 30d supply, fill #1

## 2024-08-25 MED ORDER — INSULIN GLARGINE-YFGN 100 UNIT/ML ~~LOC~~ SOPN
20.0000 [IU] | PEN_INJECTOR | Freq: Every day | SUBCUTANEOUS | 11 refills | Status: DC
  Filled 2024-08-25 – 2024-11-01 (×3): qty 9, 45d supply, fill #0
  Filled 2024-11-02: qty 9, 30d supply, fill #0

## 2024-08-25 NOTE — Progress Notes (Signed)
 08/25/2024 Name: Erica [name] MRN: [id number] DOB: [birth year]  Chief Complaint  Patient presents with   Diabetes   Hyperlipidemia    Erica [name] is a 53 y.o. year old female who presented for a telephone visit.   They were referred to the pharmacist by their PCP for assistance in managing diabetes.  PMH includes HTN, T2DM, HLD, depression.   Subjective: Patient was last seen by PCP, Erica Borer, NP, on 07/18/24. At last visit, A1C continued to be elevated at 11.3%. Patient reported not taking her medications as prescribed. She was recently hospitalized in June 2025 for cellulitis and she is now following with ID. She was discharged from the hospital on basal insulin  + Humalog  per sliding scale, in addition to her Ozempic  and metformin . Patient was outreached be pharmacy via telephone on 07/26/24. She reported occasionally missing doses of her medications, but glycemic control was improved with TIR of 69% per her [facility] report. She was instructed to focus on adherence to insulin  glargine, Ozempic , and metformin  and hold off on using Humalog  TID with meals per sliding scale (since she had not been using at all over the past few weeks). She was seen in office with PCP on 08/16/24. She reported being out of Semglee  at that time and was not feeling well. It was dispensed for a 30ds on 08/17/24.  Today, patient reports doing well. She has been able to resume her insulin  glargine, but she is not currently wearing a CGM. For some reason, she was dispensed the FL2+ instead of the FL3+ at last fill. She reports that she continues to have some dizzy spells, but overall is feeling better since restarting insulin .   Care Team: Primary Care Provider: Borer Erica RAMAN, NP ; Next Scheduled Visit: 10/18/24  Medication Access/Adherence  Current Pharmacy:  [facility] [address] [place] [address] Phone: [phone] Fax: [phone]   Patient  reports affordability concerns with their medications: No  Patient reports access/transportation concerns to their pharmacy: No  Patient reports adherence concerns with their medications:  Yes  - history of skipping doses   Diabetes:  Current medications: insulin  glargine (Semglee ) 20 units daily, metformin  XR 1000 mg BID with meals, Ozempic  2 mg weekly (Sundays)  Current glucose readings:  Freestyle Libre 3 CGM - $15/1 mo supply - reports this will be affordable for her      Trends: no data in ~1 week, patient had been fairly well controlled but more elevated over the past week (in the setting of being out of Semglee )  Patient reports hypoglycemic s/sx including occasional dizziness - but, she has not been wearing her CGM so unsure whether this is due to hypoglycemia.   Patient denies hyperglycemic symptoms including polyuria, polydipsia, polyphagia, nocturia, neuropathy, blurred vision.  Current meal patterns: did not discuss at length today. Reports her appetite is variable and she does not eat at consistent times. She is eating 1-2 meals per day. Sometimes eats a bowl of cereal before bed.  Current physical activity: did not discuss today  Current medication access support: none  Hypertension:  Current medications: valsartan -hydrochlorothiazide  80-12.5 mg daily  Patient has anautomated, upper arm home BP cuff - not currently checking   Patient reports hypotensive s/sx including dizziness, lightheadedness - occasionally when she is getting out of bed in the morning   Hyperlipidemia/ASCVD Risk Reduction  Current lipid lowering medications: atorvastatin  40 mg daily  Clinical ASCVD: No  The 10-year ASCVD risk score (Arnett  DK, et al., 2019) is: 17.1%   Values used to calculate the score:     Age: 63 years     Clincally relevant sex: Female     Is Non-Hispanic African American: Yes     Diabetic: Yes     Tobacco smoker: No     Systolic Blood Pressure: 131 mmHg     Is  BP treated: Yes     HDL Cholesterol: 32 mg/dL     Total Cholesterol: 183 mg/dL   Objective:  BP Readings from Last 3 Encounters:  08/16/24 131/82  07/17/24 (!) 100/57  07/10/24 114/78    Lab Results  Component Value Date   HGBA1C 11.3 (H) 06/26/2024   HGBA1C 11.1 (A) 02/17/2024   HGBA1C 11.4 (H) 02/07/2024       Latest Ref Rng & Units 08/16/2024    2:02 PM 07/10/2024    3:33 PM 06/29/2024    4:45 AM  BMP  Glucose 70 - 99 mg/dL 739  806  806   BUN 6 - 24 mg/dL 8  6  <5   Creatinine 9.42 - 1.00 mg/dL 9.24  9.33  9.38   BUN/Creat Ratio 9 - 23 11  9     Sodium 134 - 144 mmol/L 137  137  138   Potassium 3.5 - 5.2 mmol/L 4.1  4.2  3.8   Chloride 96 - 106 mmol/L 98  101  105   CO2 20 - 29 mmol/L 21  22  24    Calcium  8.7 - 10.2 mg/dL 9.7  9.6  8.7     Lab Results  Component Value Date   CHOL 183 02/22/2024   HDL 32 (L) 02/22/2024   LDLCALC 96 02/22/2024   TRIG 329 (H) 02/22/2024   CHOLHDL 5.7 (H) 02/22/2024    Medications Reviewed Today     Reviewed by Erica [name], RPH (Pharmacist) on 08/25/24 at 0926  Med List Status: <None>   Medication Order Taking? Sig Documenting Provider Last Dose Status Informant    Discontinued 11/10/20 1306            Med Note (MCKEOWN, Erica [name] Nov 10, 2020 12:39 PM) Pt does not take  ascorbic acid  (VITAMIN C ) 500 MG tablet [id number]  Take 1 tablet (500 mg total) by mouth 2 (two) times daily. Erica Labella, MD  Active   aspirin  81 MG tablet [id number]  Take 81 mg by mouth at bedtime.  [provider]  Active Self  atorvastatin  (LIPITOR) 40 MG tablet [id number] Yes Take 2 tablets (80 mg total) by mouth daily. Erica Erica RAMAN, NP  Active Self  Blood Glucose Monitoring Suppl (TRUE METRIX METER) w/Device KIT [id number]  Use to check blood sugar up to twice daily. Erica Erica RAMAN, NP  Active            Med Note [name], Erica   Mon Jun 26, 2024 10:10 AM)    buPROPion  (WELLBUTRIN  XL) 150 MG 24 hr tablet [id number]  Take 1 tablet  (150 mg total) by mouth every morning. Erica Erica RAMAN, NP  Active     Discontinued 08/25/24 [phone] (Change in therapy)   Continuous Glucose Sensor (FREESTYLE LIBRE 3 PLUS SENSOR) MISC [id number]  Change sensor every 15 days. Erica Erica RAMAN, NP  Active   ergocalciferol  (VITAMIN D2) 1.25 MG (50000 UT) capsule [id number]  Take 1 capsule by mouth once a week. Nichols, Tonya S, NP  Active Self  ergocalciferol  (VITAMIN D2) 1.25 MG (50000  UT) capsule [id number]  Take 1 capsule by mouth once a week. Erica Erica RAMAN, NP  Active Self  escitalopram  (LEXAPRO ) 20 MG tablet [id number]  Take 1 tablet (20 mg total) by mouth daily. Erica Erica RAMAN, NP  Active Self    Discontinued 11/10/20 1306            Med Note RUSH, Erica [name] Nov 10, 2020 12:38 PM) Pt reports she does not take  Glucose Blood (BLOOD GLUCOSE TEST STRIPS) STRP [id number]  Use to check blood sugar up to twice daily. Erica Erica RAMAN, NP  Active            Med Note MARISA NATHANEL LOISE Pablo Jun 26, 2024  4:41 PM)      Discontinued 08/25/24 0921 (Change in therapy)   insulin  glargine-yfgn (SEMGLEE ) 100 UNIT/ML Pen [id number] Yes Inject 20 Units into the skin daily. May increase up to 30 units daily if instructed by your provider. Erica Erica RAMAN, NP  Active     Discontinued 08/25/24 [phone] (No longer needed (for PRN medications))     Discontinued 08/25/24 0925 (Change in therapy)   Insulin  Pen Needle (PEN NEEDLES) 32G X 4 MM MISC [id number] Yes Use as directed to inject insulin  once daily. Erica Erica RAMAN, NP  Active   Lancet Device MISC [id number]  Use to check blood sugar up to twice daily. May substitute to any manufacturer covered by patient's insurance. Erica Erica RAMAN, NP  Active            Med Note MARISA, NATHANEL LOISE   Mon Jun 26, 2024  4:41 PM)    meclizine  (ANTIVERT ) 25 MG tablet [id number]  Take 1 tablet (25 mg total) by mouth 3 (three) times daily as needed for dizziness. Ula Prentice JONELLE, MD  Active Self  medroxyPROGESTERone  Acetate 150 MG/ML  SUSY [id number]  Inject 1 mL (150 mg total) into the muscle every 3 (three) months. Zina Jerilynn LABOR, MD  Active     Discontinued 09/30/23 1422   metFORMIN  (GLUCOPHAGE -XR) 500 MG 24 hr tablet [id number] Yes Take 2 tablets (1,000 mg total) by mouth 2 (two) times daily with a meal. Erica Erica RAMAN, NP  Active Self           Med Note MARISA, NATHANEL LOISE   Mon Jun 26, 2024  4:41 PM)    Multiple Vitamin (MULTIVITAMIN) tablet [id number]  Take 1 tablet by mouth daily with breakfast. [provider]  Active Self  Multiple Vitamins-Minerals (HAIR SKIN NAILS) CAPS [id number]  Take 2 capsules by mouth at bedtime. [provider]  Active Self  omeprazole  (PRILOSEC) 20 MG capsule [id number]  Take 1 capsule (20 mg total) by mouth daily. Erica Erica RAMAN, NP  Active   pregabalin  (LYRICA ) 150 MG capsule [id number]  Take 1 capsule (150 mg total) by mouth 2 (two) times daily.  Patient taking differently: Take 150 mg by mouth 2 (two) times daily as needed (for nerve pain).   Erica Erica RAMAN, NP  Active Self  Semaglutide , 2 MG/DOSE, (OZEMPIC , 2 MG/DOSE,) 8 MG/3ML SOPN [id number] Yes Inject 2 mg as directed once a week. Erica Erica RAMAN, NP  Active Self  testosterone  cypionate (DEPOTESTOSTERONE CYPIONATE) injection 150 mg [id number]   Nichols, Tonya S, NP  Active   traZODone  (DESYREL ) 150 MG tablet [id number]  Take 1 tablet (150 mg total) by mouth at bedtime. Erica Erica RAMAN, NP  Active Self  triamcinolone  (KENALOG ) 0.025 % ointment [id number]  Apply 1 Application topically 2 (two) times daily. Erica Erica RAMAN, NP  Active   triamcinolone  (KENALOG ) 0.025 % ointment [id number]  Apply 1 Application topically 2 (two) times daily. Erica Erica RAMAN, NP  Active   valsartan -hydrochlorothiazide  (DIOVAN -HCT) 80-12.5 MG tablet [id number] Yes Take 1 tablet by mouth daily. Erica Erica RAMAN, NP  Active Self  zinc  sulfate, 50mg  elemental zinc , 220 (50 Zn) MG capsule [id number]  Take 1 capsule (220 mg total) by mouth daily. Erica Labella, MD  Active               Assessment/Plan:   Diabetes: - Currently uncontrolled with most recent A1C of 11.3% above goal <7% in the setting of medication nonadherence. However, BG are improved when she is adherent to her insulin . Encouraged her to reapply CGM and assisted in sending correct order to the pharmacy. She reports she is tolerating her other medications well. Do not feel that she needs to take prandial insulin  at this time, as she can likely achieve glycemic control with basal insulin  + GLP-1RA and oral antihyperglycemics.  - Last UACR August 2025 - 24 mg/g - Reviewed long term cardiovascular and renal outcomes of uncontrolled blood sugar - Reviewed goal A1c, goal fasting, and goal 2 hour post prandial glucose - Reviewed hypoglycemia management plan and the rule of 15 - Recommend to continue Ozempic  2 mg weekly - Recommend to continue insulin  glargine 20 units daily - Recommend to continue metformin  XR 1000 mg BID - Recommend to check glucose continuously using FL3 CGM. Requested fill from [facility] - Next A1C due Oct 2025      Hypertension: - Currently controlled with clinic BP consistently below goal less than 130/80. Patient is having possible s/sx of hypotension with dizziness upon waking, but she is not currently monitoring. She also reports occasionally skipping meals, which could lead to dizziness. Encouraged her to monitor at home to evaluate for low BP < 100/60 mmHg with symptoms. - Reviewed long term cardiovascular and renal outcomes of uncontrolled blood pressure - Reviewed appropriate blood pressure monitoring technique and reviewed goal blood pressure. Recommended to check home blood pressure and heart rate  once daily and keep a log to bring to upcoming appointments - Recommend to continue valsartan -hydrochlorothiazide  80-12.5 mg daily     Hyperlipidemia/ASCVD Risk Reduction: - Currently uncontrolled with most recent LDL-C of 96  mg/dL above goal < 70 mg/dL given U7If + comorbidities. TG also elevated > 300 mg/dL, likely in the setting of poor glycemic control. Expect improvement if she is able to remain adherent to her medications. High intensity statin indicated. 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 She has been following with the orthopedic doctor.    Patient verbalized understanding of treatment plan.   Follow Up Plan:  Pharmacist 09/29/24 PCP clinic visit 10/18/24   Lorain Baseman, PharmD [facility] [phone]

## 2024-08-28 ENCOUNTER — Other Ambulatory Visit: Payer: Self-pay

## 2024-08-28 ENCOUNTER — Other Ambulatory Visit (HOSPITAL_BASED_OUTPATIENT_CLINIC_OR_DEPARTMENT_OTHER): Payer: Self-pay

## 2024-08-28 ENCOUNTER — Ambulatory Visit (INDEPENDENT_AMBULATORY_CARE_PROVIDER_SITE_OTHER): Admitting: Orthopedic Surgery

## 2024-08-28 ENCOUNTER — Other Ambulatory Visit: Payer: Self-pay | Admitting: Nurse Practitioner

## 2024-08-28 DIAGNOSIS — L84 Corns and callosities: Secondary | ICD-10-CM | POA: Diagnosis not present

## 2024-08-28 DIAGNOSIS — E1165 Type 2 diabetes mellitus with hyperglycemia: Secondary | ICD-10-CM

## 2024-08-28 NOTE — Progress Notes (Signed)
 Orthopedic Office Note  Patient comes in today because she has noticed increased callus formation over the left fifth toe.  She is also noticed callus formation over the plantar aspect of her right second toe.  She is interested in having these corns removed today in office.  On exam, she does have corns over the lateral aspect of the left fifth toe.  Her prior surgical site has healed.  There is no erythema around the surgical site.  There is no drainage.  On the plantar aspect of her right foot near the second toe MTP joint there is also a corn.   Procedure note: After discussing the risk, benefits, alternatives of bilateral foot corn excision, this was done today in the office.  A 15 blade knife was used to sharply excise the hyperkeratotic lesion over the left fifth toe.  The middle portion of the corn was also sharply excised with a 15 blade.  The corn was excised until all hyperkeratotic material was removed and there was punctate bleeding seen at the level of the skin.  Next, attention was turned to the corn at the plantar aspect of the right foot.  A 15 blade knife was used to sharply excise the hyperkeratotic lesion over the plantar aspect of the right second toe MTP joint.  The middle portion of the corn was also sharply excised with a 15 blade.  The corn was excised until all hyperkeratotic material was removed and there was punctate bleeding seen at the level of the skin.   Patient has healed her surgical incision and there is no further evidence of infection.  She has been regularly followed with podiatry in the past.  She is going to reestablish with them.  She is curious if they can continue to take care of these corns.  I explained to her that they could do that.  That would probably be the an appropriate office to have that done since she has now healed from her surgery.  Patient should return to the office on an as-needed basis.

## 2024-08-28 NOTE — Telephone Encounter (Signed)
 Please advise [facility]

## 2024-08-30 ENCOUNTER — Ambulatory Visit: Admitting: Orthopedic Surgery

## 2024-08-31 ENCOUNTER — Other Ambulatory Visit: Payer: Self-pay

## 2024-09-01 ENCOUNTER — Other Ambulatory Visit (HOSPITAL_BASED_OUTPATIENT_CLINIC_OR_DEPARTMENT_OTHER): Payer: Self-pay

## 2024-09-01 MED ORDER — PREGABALIN 150 MG PO CAPS
150.0000 mg | ORAL_CAPSULE | Freq: Two times a day (BID) | ORAL | 1 refills | Status: AC
  Filled 2024-09-01 – 2024-10-31 (×3): qty 60, 30d supply, fill #0

## 2024-09-04 ENCOUNTER — Other Ambulatory Visit (HOSPITAL_BASED_OUTPATIENT_CLINIC_OR_DEPARTMENT_OTHER): Payer: Self-pay

## 2024-09-07 ENCOUNTER — Encounter: Payer: Self-pay | Admitting: Podiatry

## 2024-09-07 ENCOUNTER — Ambulatory Visit (INDEPENDENT_AMBULATORY_CARE_PROVIDER_SITE_OTHER): Admitting: Podiatry

## 2024-09-07 DIAGNOSIS — M2012 Hallux valgus (acquired), left foot: Secondary | ICD-10-CM | POA: Diagnosis not present

## 2024-09-07 DIAGNOSIS — M79674 Pain in right toe(s): Secondary | ICD-10-CM

## 2024-09-07 DIAGNOSIS — M2042 Other hammer toe(s) (acquired), left foot: Secondary | ICD-10-CM

## 2024-09-07 DIAGNOSIS — B351 Tinea unguium: Secondary | ICD-10-CM

## 2024-09-07 DIAGNOSIS — M2041 Other hammer toe(s) (acquired), right foot: Secondary | ICD-10-CM

## 2024-09-07 NOTE — Progress Notes (Signed)
 This patient presents to the office with chief complaint of painful fifth toe left foot.   She says she had surgery for her corn fifth toe left foot.  She says the thick corn returns and she has had the corn trimmed by her orthopedic doctor multiple times. Her medical doctor is Ozell Ada.  She has previously been seen by Drs.  Tobie Slicker and [place].   She also comes to the office for nail care but she is wearing nail polish.  She has problems with her big toenail left foot and second toe right foot.  She presents to the office with diagnosis of Type 2 diabetes, peripheral nerve disease.  She was poor historian.  For example she said I had previously seen her and recommended foot surgery.  I believe I have never treated her before today.  General Appearance  Alert, conversant and in no acute stress.  Vascular  Dorsalis pedis and posterior tibial  pulses are palpable  bilaterally.  Capillary return is within normal limits  bilaterally. Temperature is within normal limits  bilaterally.  Neurologic  Senn-Weinstein monofilament wire test within normal limits  bilaterally. Muscle power within normal limits bilaterally.  Nails Thick disfigured discolored nails with subungual debris  from hallux to fifth toes bilaterally. No evidence of bacterial infection or drainage bilaterally. Pain at base medial border left hallux and thickness of second nail right foot noted.  Orthopedic  No limitations of motion  feet .  No crepitus or effusions noted.  No bony pathology or digital deformities noted.  HAV  B/L.  Skin  normotropic skin with no porokeratosis noted bilaterally.  No signs of infections or ulcers noted.   Heloma durum noted fifth toe left foot.  Heloma durum fifth toe left foot.  Nail dystrophy 1 left and 2 right.  IE.  Debride nails  B/L.  Discussed her fifth toe and told her to be evaluated by one of our doctors for surgery   She may benefit from nail surgery also.   RTC prn  [name]  DPM

## 2024-09-07 NOTE — Addendum Note (Signed)
 Addended by: LOREDA HACKER on: 09/07/2024 11:55 AM   Modules accepted: Level of Service

## 2024-09-08 ENCOUNTER — Ambulatory Visit (INDEPENDENT_AMBULATORY_CARE_PROVIDER_SITE_OTHER): Admitting: Nurse Practitioner

## 2024-09-08 ENCOUNTER — Ambulatory Visit: Payer: Self-pay

## 2024-09-08 ENCOUNTER — Encounter: Payer: Self-pay | Admitting: Nurse Practitioner

## 2024-09-08 ENCOUNTER — Other Ambulatory Visit (HOSPITAL_BASED_OUTPATIENT_CLINIC_OR_DEPARTMENT_OTHER): Payer: Self-pay

## 2024-09-08 VITALS — BP 149/82 | HR 103 | Temp 98.3°F | Ht 72.0 in | Wt 256.6 lb

## 2024-09-08 DIAGNOSIS — J069 Acute upper respiratory infection, unspecified: Secondary | ICD-10-CM | POA: Diagnosis not present

## 2024-09-08 MED ORDER — AZITHROMYCIN 250 MG PO TABS
ORAL_TABLET | ORAL | 0 refills | Status: AC
  Filled 2024-09-08 – 2024-11-01 (×2): qty 6, 5d supply, fill #0

## 2024-09-08 NOTE — Progress Notes (Signed)
 Subjective   Patient ID: Erica [name], female    DOB: Nov 14, 1971, 53 y.o.   MRN: [id number]  Chief Complaint  Patient presents with   Acute Visit    Pt presents today for coughing and runny nose and a mild fever.     Referring provider: [name], NP  Clorissa Gruenberg is a 53 y.o. female with Past Medical History: No date: Arthritis No date: Depression No date: Diabetes mellitus No date: Fatty liver No date: Hernia, abdominal No date: Hypercholesteremia No date: IBS (irritable bowel syndrome)   HPI  Patient presents today for an acute visit.  She states that she has been having cough runny nose and mild fever since this past Wednesday. Denies f/c/s, n/v/d, hemoptysis, PND, leg swelling Denies chest pain or edema.     No Known Allergies  Immunization History  Administered Date(s) Administered   PNEUMOCOCCAL CONJUGATE-20 05/05/2022   PPD Test 02/08/2023   Tdap 02/01/2020    Tobacco History: Social History   Tobacco Use  Smoking Status Never  Smokeless Tobacco Never   Counseling given: Not Answered   Outpatient Encounter Medications as of 09/08/2024  Medication Sig   ascorbic acid  (VITAMIN C ) 500 MG tablet Take 1 tablet (500 mg total) by mouth 2 (two) times daily.   aspirin  81 MG tablet Take 81 mg by mouth at bedtime.    atorvastatin  (LIPITOR) 40 MG tablet Take 2 tablets (80 mg total) by mouth daily.   azithromycin  (ZITHROMAX ) 250 MG tablet Take 2 tablets on day 1, then 1 tablet daily on days 2 through 5   Blood Glucose Monitoring Suppl (TRUE METRIX METER) w/Device KIT Use to check blood sugar up to twice daily.   buPROPion  (WELLBUTRIN  XL) 150 MG 24 hr tablet Take 1 tablet (150 mg total) by mouth every morning.   Continuous Glucose Sensor (FREESTYLE LIBRE 3 PLUS SENSOR) MISC Change sensor every 15 days.   ergocalciferol  (VITAMIN D2) 1.25 MG (50000 UT) capsule Take 1 capsule by mouth once a week.   ergocalciferol  (VITAMIN D2) 1.25 MG (50000 UT) capsule Take  1 capsule by mouth once a week.   escitalopram  (LEXAPRO ) 20 MG tablet Take 1 tablet (20 mg total) by mouth daily.   Glucose Blood (BLOOD GLUCOSE TEST STRIPS) STRP Use to check blood sugar up to twice daily.   insulin  glargine-yfgn (SEMGLEE ) 100 UNIT/ML Pen Inject 20 Units into the skin daily. May increase up to 30 units daily if instructed by your provider.   Insulin  Pen Needle (PEN NEEDLES) 32G X 4 MM MISC Use as directed to inject insulin  once daily.   Lancet Device MISC Use to check blood sugar up to twice daily. May substitute to any manufacturer covered by patient's insurance.   meclizine  (ANTIVERT ) 25 MG tablet Take 1 tablet (25 mg total) by mouth 3 (three) times daily as needed for dizziness.   medroxyPROGESTERone  Acetate 150 MG/ML SUSY Inject 1 mL (150 mg total) into the muscle every 3 (three) months.   metFORMIN  (GLUCOPHAGE -XR) 500 MG 24 hr tablet Take 2 tablets (1,000 mg total) by mouth 2 (two) times daily with a meal.   Multiple Vitamin (MULTIVITAMIN) tablet Take 1 tablet by mouth daily with breakfast.   Multiple Vitamins-Minerals (HAIR SKIN NAILS) CAPS Take 2 capsules by mouth at bedtime.   omeprazole  (PRILOSEC) 20 MG capsule Take 1 capsule (20 mg total) by mouth daily.   pregabalin  (LYRICA ) 150 MG capsule Take 1 capsule (150 mg total) by mouth 2 (two) times  daily.   Semaglutide , 2 MG/DOSE, (OZEMPIC , 2 MG/DOSE,) 8 MG/3ML SOPN Inject 2 mg as directed once a week.   traZODone  (DESYREL ) 150 MG tablet Take 1 tablet (150 mg total) by mouth at bedtime.   triamcinolone  (KENALOG ) 0.025 % ointment Apply 1 Application topically 2 (two) times daily.   triamcinolone  (KENALOG ) 0.025 % ointment Apply 1 Application topically 2 (two) times daily.   valsartan -hydrochlorothiazide  (DIOVAN -HCT) 80-12.5 MG tablet Take 1 tablet by mouth daily.   zinc  sulfate, 50mg  elemental zinc , 220 (50 Zn) MG capsule Take 1 capsule (220 mg total) by mouth daily.   [DISCONTINUED] amitriptyline (ELAVIL) 50 MG tablet  amitriptyline 50 mg tablet  Take 1 tablet(s) every day by oral route as directed for 30 days.   [DISCONTINUED] gabapentin (NEURONTIN) 300 MG capsule Take 300 mg by mouth.   [DISCONTINUED] metFORMIN  (GLUCOPHAGE ) 1000 MG tablet Take 1 tablet (1,000 mg total) by mouth 2 (two) times daily with a meal.   Facility-Administered Encounter Medications as of 09/08/2024  Medication   testosterone  cypionate (DEPOTESTOSTERONE CYPIONATE) injection 150 mg    Review of Systems  Review of Systems  Constitutional: Negative.   HENT:  Positive for congestion, postnasal drip, sinus pressure and sinus pain.   Cardiovascular: Negative.   Gastrointestinal: Negative.   Allergic/Immunologic: Negative.   Neurological: Negative.   Psychiatric/Behavioral: Negative.       Objective:   BP (!) 149/82 (BP Location: Left Arm, Patient Position: Sitting, Cuff Size: Large)   Pulse (!) 103   Temp 98.3 F (36.8 C) (Oral)   Ht 6' (1.829 m)   Wt 256 lb 9.6 oz (116.4 kg)   SpO2 97%   BMI 34.80 kg/m   Wt Readings from Last 5 Encounters:  09/08/24 256 lb 9.6 oz (116.4 kg)  08/16/24 252 lb (114.3 kg)  07/17/24 251 lb 6.4 oz (114 kg)  07/10/24 248 lb (112.5 kg)  06/27/24 250 lb (113.4 kg)     Physical Exam Vitals and nursing note reviewed.  Constitutional:      General: She is not in acute distress.    Appearance: She is well-developed.  HENT:     Nose: Congestion present.  Cardiovascular:     Rate and Rhythm: Normal rate and regular rhythm.  Pulmonary:     Effort: Pulmonary effort is normal.     Breath sounds: Normal breath sounds.  Neurological:     Mental Status: She is alert and oriented to person, place, and time.       Assessment & Plan:   Upper respiratory tract infection, unspecified type -     Azithromycin ; Take 2 tablets on day 1, then 1 tablet daily on days 2 through 5  Dispense: 6 tablet; Refill: 0     Return if symptoms worsen or fail to improve.   Bascom GORMAN Borer,  NP 09/08/2024

## 2024-09-08 NOTE — Telephone Encounter (Signed)
 Pt seen today. KH

## 2024-09-08 NOTE — Telephone Encounter (Signed)
 FYI Only or Action Required?: FYI only for provider.  Patient was last seen in primary care on 08/16/2024 by [name], NP.  Called Nurse Triage reporting Cough.  Symptoms began several days ago.  Interventions attempted: Rest, hydration, or home remedies.  Symptoms are: gradually worsening.  Triage Disposition: See Physician Within 24 Hours (overriding Home Care)  Patient/caregiver understands and will follow disposition?: Yes   Copied from CRM [id number]. Topic: Clinical - Red Word Triage >> Sep 08, 2024  8:59 AM Turkey B wrote: [facility] that prompted transfer to Nurse Triage: Patient has cold, cough, green phlegm, sneezing, fever Reason for Disposition  Cough  Answer Assessment - Initial Assessment Questions Additional info: Requesting sdv to evaluate cough.-scheduled   1. ONSET: When did the cough begin?      Wednesday 2. SEVERITY: How bad is the cough today?      Worse at night 3. SPUTUM: Describe the color of your sputum (e.g., none, dry cough; clear, white, yellow, green)     green 4. HEMOPTYSIS: Are you coughing up any blood? If Yes, ask: How much? (e.g., flecks, streaks, tablespoons, etc.)     denies 5. DIFFICULTY BREATHING: Are you having difficulty breathing? If Yes, ask: How bad is it? (e.g., mild, moderate, severe)      denies 6. FEVER: Do you have a fever? If Yes, ask: What is your temperature, how was it measured, and when did it start?     99.8 7. CARDIAC HISTORY: Do you have any history of heart disease? (e.g., heart attack, congestive heart failure)      yes 8. LUNG HISTORY: Do you have any history of lung disease?  (e.g., pulmonary embolus, asthma, emphysema)      9. PE RISK FACTORS: Do you have a history of blood clots? (or: recent major surgery, recent prolonged travel, bedridden)      10. OTHER SYMPTOMS: Do you have any other symptoms? (e.g., runny nose, wheezing, chest pain)       sneezing  Protocols used: Cough -  Acute Productive-A-AH

## 2024-09-11 ENCOUNTER — Ambulatory Visit: Admitting: Orthopedic Surgery

## 2024-09-15 ENCOUNTER — Other Ambulatory Visit: Payer: Self-pay

## 2024-09-18 ENCOUNTER — Other Ambulatory Visit (HOSPITAL_BASED_OUTPATIENT_CLINIC_OR_DEPARTMENT_OTHER): Payer: Self-pay

## 2024-09-19 ENCOUNTER — Other Ambulatory Visit: Payer: Self-pay

## 2024-09-19 ENCOUNTER — Other Ambulatory Visit (HOSPITAL_BASED_OUTPATIENT_CLINIC_OR_DEPARTMENT_OTHER): Payer: Self-pay

## 2024-09-20 ENCOUNTER — Other Ambulatory Visit (HOSPITAL_BASED_OUTPATIENT_CLINIC_OR_DEPARTMENT_OTHER): Payer: Self-pay

## 2024-09-21 ENCOUNTER — Ambulatory Visit

## 2024-09-21 ENCOUNTER — Other Ambulatory Visit (HOSPITAL_BASED_OUTPATIENT_CLINIC_OR_DEPARTMENT_OTHER): Payer: Self-pay

## 2024-09-21 DIAGNOSIS — L84 Corns and callosities: Secondary | ICD-10-CM

## 2024-09-21 DIAGNOSIS — L603 Nail dystrophy: Secondary | ICD-10-CM

## 2024-09-21 DIAGNOSIS — M2042 Other hammer toe(s) (acquired), left foot: Secondary | ICD-10-CM | POA: Diagnosis not present

## 2024-09-21 NOTE — Progress Notes (Signed)
 Subjective:  Patient ID: Erica [name], female    DOB: 11-22-1971,  MRN: [id number]  Chief Complaint  Patient presents with   Nail Problem    L foot 5th met surgery July 2025. Minimal soreness. Also concerned with L great toe nail separation.  Diabetic A1c 11.3  .81 mg Asprin    53 y.o. female presents with the above complaint.  She is here for her left hallux nail.  She does have a history of an infected hematoma to her left fifth toe, just lateral to the metatarsophalangeal joint.  She had an irrigation and debridement of this on July 15.  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  She was seen by infectious disease who recommended Bactrim  for 1 to 2 weeks following discharge from the hospital.  She was most recently seen by her surgeon, Dr. Georgina, MD 08/28/2024 who noted resolution of infection, healed incision and presence of corn over the fifth toe.  Review of Systems: Negative except as noted in the HPI. Denies N/V/F/Ch.  Past Medical History:  Diagnosis Date   Arthritis    Depression    Diabetes mellitus    Fatty liver    Hernia, abdominal    Hypercholesteremia    IBS (irritable bowel syndrome)     Current Outpatient Medications:    ascorbic acid  (VITAMIN C ) 500 MG tablet, Take 1 tablet (500 mg total) by mouth 2 (two) times daily., Disp: 60 tablet, Rfl: 2   aspirin  81 MG tablet, Take 81 mg by mouth at bedtime. , Disp: , Rfl:    atorvastatin  (LIPITOR) 40 MG tablet, Take 2 tablets (80 mg total) by mouth daily., Disp: 90 tablet, Rfl: 3   Blood Glucose Monitoring Suppl (TRUE METRIX METER) w/Device KIT, Use to check blood sugar up to twice daily., Disp: 1 kit, Rfl: 0   buPROPion  (WELLBUTRIN  XL) 150 MG 24 hr tablet, Take 1 tablet (150 mg total) by mouth every morning., Disp: 30 tablet, Rfl: 2   Continuous Glucose Sensor (FREESTYLE LIBRE 3 PLUS SENSOR) MISC, Change sensor every 15 days., Disp: 6 each, Rfl: 2   ergocalciferol  (VITAMIN D2) 1.25 MG (50000 UT) capsule, Take 1 capsule by mouth once  a week., Disp: 4 capsule, Rfl: 2   ergocalciferol  (VITAMIN D2) 1.25 MG (50000 UT) capsule, Take 1 capsule by mouth once a week., Disp: 4 capsule, Rfl: 2   escitalopram  (LEXAPRO ) 20 MG tablet, Take 1 tablet (20 mg total) by mouth daily., Disp: 90 tablet, Rfl: 3   Glucose Blood (BLOOD GLUCOSE TEST STRIPS) STRP, Use to check blood sugar up to twice daily., Disp: 100 strip, Rfl: 3   insulin  glargine-yfgn (SEMGLEE ) 100 UNIT/ML Pen, Inject 20 Units into the skin daily. May increase up to 30 units daily if instructed by your provider., Disp: 9 mL, Rfl: 11   Insulin  Pen Needle (PEN NEEDLES) 32G X 4 MM MISC, Use as directed to inject insulin  once daily., Disp: 100 each, Rfl: 3   Lancet Device MISC, Use to check blood sugar up to twice daily. May substitute to any manufacturer covered by patient's insurance., Disp: 1 each, Rfl: 0   meclizine  (ANTIVERT ) 25 MG tablet, Take 1 tablet (25 mg total) by mouth 3 (three) times daily as needed for dizziness., Disp: 30 tablet, Rfl: 0   medroxyPROGESTERone  Acetate 150 MG/ML SUSY, Inject 1 mL (150 mg total) into the muscle every 3 (three) months., Disp: 1 mL, Rfl: 2   metFORMIN  (GLUCOPHAGE -XR) 500 MG 24 hr tablet, Take 2 tablets (  1,000 mg total) by mouth 2 (two) times daily with a meal., Disp: 360 tablet, Rfl: 1   Multiple Vitamin (MULTIVITAMIN) tablet, Take 1 tablet by mouth daily with breakfast., Disp: , Rfl:    Multiple Vitamins-Minerals (HAIR SKIN NAILS) CAPS, Take 2 capsules by mouth at bedtime., Disp: , Rfl:    omeprazole  (PRILOSEC) 20 MG capsule, Take 1 capsule (20 mg total) by mouth daily., Disp: 30 capsule, Rfl: 3   pregabalin  (LYRICA ) 150 MG capsule, Take 1 capsule (150 mg total) by mouth 2 (two) times daily., Disp: 60 capsule, Rfl: 1   Semaglutide , 2 MG/DOSE, (OZEMPIC , 2 MG/DOSE,) 8 MG/3ML SOPN, Inject 2 mg as directed once a week., Disp: 3 mL, Rfl: 2   traZODone  (DESYREL ) 150 MG tablet, Take 1 tablet (150 mg total) by mouth at bedtime., Disp: 90 tablet, Rfl: 2    triamcinolone  (KENALOG ) 0.025 % ointment, Apply 1 Application topically 2 (two) times daily., Disp: 30 g, Rfl: 0   triamcinolone  (KENALOG ) 0.025 % ointment, Apply 1 Application topically 2 (two) times daily., Disp: 454 g, Rfl: 0   valsartan -hydrochlorothiazide  (DIOVAN -HCT) 80-12.5 MG tablet, Take 1 tablet by mouth daily., Disp: 90 tablet, Rfl: 3   zinc  sulfate, 50mg  elemental zinc , 220 (50 Zn) MG capsule, Take 1 capsule (220 mg total) by mouth daily., Disp: 60 capsule, Rfl: 2  Current Facility-Administered Medications:    testosterone  cypionate (DEPOTESTOSTERONE CYPIONATE) injection 150 mg, 150 mg, Intramuscular, Q90 days, Nichols, Tonya S, NP, 150 mg at 05/04/24 9165  Social History   Tobacco Use  Smoking Status Never  Smokeless Tobacco Never    No Known Allergies Objective:  There were no vitals filed for this visit. There is no height or weight on file to calculate BMI. Constitutional Well developed. Well nourished. Oriented to person, place, and time.  Vascular Dorsalis pedis pulses palpable bilaterally. Posterior tibial pulses palpable bilaterally. Capillary refill normal to all digits.  No cyanosis or clubbing noted. Pedal hair growth normal.  Neurologic Normal speech. Epicritic sensation to light touch grossly present bilaterally. Negative tinel sign at tarsal tunnel bilaterally.   Dermatologic Skin texture and turgor are within normal limits.  No open wounds. Hyperkeratotic lesion lateral aspect of left 5th digit without central core.  Nails 1-5 b/l are elongated, dystrophic, thickened, crumbly in texture with subungual debris.  Left hallux nail has minor lifting with loss of adherence to underlying nail bed at most distal aspect. Well adhered proximally and stable, no teetering of nail.   Musculoskeletal: 5/5 muscle strength to all major pedal muscle groups. No contributing deformity. Hammertoes 2-5 b/l with minimal MTP contracture. 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     Radiographs: Taken and reviewed.  3 weightbearing views of the left foot.  These do not demonstrate any acute osseous abnormality.  The left fifth digit does exhibit hammering without any signs of osseous erosions or periosteal reactions consistent with osteomyelitis.  Hammertoes to digits 2 through 5.  No fracture or dislocation.  Assessment:   1. Hammer toe of left foot   2. Corns and callosities   3. Nail dystrophy    Plan:  - Patient was evaluated and treated and all questions answered.  Corns and callosities - Discussed with the patient her diagnosis of corns and callosities, hammertoe of bilateral feet, loss of adherence of the distal aspect of the left hallux nail. -For corn on the lateral aspect of the fifth digit, toe gel sleeve and wide shoes to decrease the formation. -Her left  hallux nail does have loss of adherence at the distal aspect of the nailbed.  It is not loose or wobbling.  I did discuss with her that this could be avulsed in the future.  We discussed that her diabetic control would need to improve as well as we would have to get insurance prior authorization.  She does express understanding this will let us  know if she would like to move forward with it in the future. I did grind down the left hallux nail out of courtesy, no incident or bleeding.  - Emphasized the importance of tight glycemic control to prevent future infections, pedal issues.   RTC PRN  Prentice Ovens, DPM AACFAS Fellowship Trained Podiatric Surgeon [facility]

## 2024-09-21 NOTE — Progress Notes (Addendum)
 09/22/2024 Cosandra Plouffe [id number] 1971-06-01  Referring provider: [name], NP Primary GI doctor: Dr. Suzann  ASSESSMENT AND PLAN:  Personal history of colon polyps 03/19/2021 colonoscopy with digestive health specialist- large cecal polyp, recall 1 year, poor prep due to ozempic  - will get official report and labs 2 day prep, hold ozempic  7 days prior, schedule colonoscopy at [facility], We have discussed the risks of bleeding, infection, perforation, medication reactions, and remote risk of death associated with colonoscopy. All questions were answered and the patient acknowledges these risk and wishes to proceed.  IBS mixed S/p cholecystectomy Daily Bm's, if diarrhea/constipation feels it is more diet related.  Likely related to ozempic , will do 2 day prep, given information about IBS, consider linzess -Can do trial of IBGARD daily -FODMAP,  and lifestyle changes discussed  Nausea with history of HH Denies GERD, dysphagia Discussed diet with small, frequent meals, soft diet with the patient.  Will call back if worsening  Diabetes type 2 on insulin  On ozempic  Discussed GLP1 with the patient, mechanism of action. Gastroparesis diet given to the patient.  Patient should be instructed to hold this medications if dose falls within 7 days of endoscopic procedure, due to increased risk of retained gastric contents.  Obesity  Body mass index is 34.18 kg/m.  -Patient has been advised to make an attempt to improve diet and exercise patterns to aid in weight loss. -Recommended diet heavy in fruits and veggies and low in animal meats, cheeses, and dairy products, appropriate calorie intake  I have reviewed the clinic note as outlined by Alan Coombs, PA and agree with the assessment, plan and medical decision making.  Ms. Zipp is referred to the gastroenterology office for history of colon polyps.  Records indicate she had a large cecal polyp removed in 2022.  Follow-up  colonoscopy in 2023 did not have adequate bowel prep potentially related to GLP-1 medication use and advised to have another colonoscopy but this is yet to be performed.  Has a history of IBS.  No active GERD at this time.  Agree with scheduling colonoscopy with 2-day bowel prep and appropriate holding of Ozempic .  Inocente Suzann, MD   Patient Care Team: [name], NP as PCP - General (Pulmonary Disease) Eldonna Suzen Octave, MD as Consulting Physician (Obstetrics and Gynecology) Brinda Lorain SQUIBB, [facility] (Pharmacist)  HISTORY OF PRESENT ILLNESS: 53 y.o. female with a past medical history listed below presents for evaluation of IBS, personal history of colon polyps.   Previously seen by digestive health specialist September 2023 for constipation, IBS, abdominal pain and personal history of colon polyps  Discussed the use of AI scribe software for clinical note transcription with the patient, who gave verbal consent to proceed.  History of Present Illness   Sherree Shankman is a 53 year old female with a history of colon polyps who presents for follow-up regarding her gastrointestinal health.  She has a history of colon polyps, with a colonoscopy performed on March 19, 2021, during which a large cecal polyp was removed. A follow-up colonoscopy was planned for one year later, but it was rescheduled due to poor bowel preparation. Further delays occurred due to work commitments, and she has not yet had the follow-up colonoscopy.  She is currently on Ozempic  for diabetes management and insulin  therapy, but does not use an insulin  pump. She experiences daily bowel movements, with a tendency towards diarrhea over constipation. Certain foods, such as corn, can trigger diarrhea. She does  not take any medication for diarrhea. Occasional nausea is noted, initially attributed to medication changes, but she no longer requires medication for nausea. She reports no heartburn or trouble swallowing.  Her past  medical history includes irritable bowel syndrome (IBS) and a cholecystectomy. She reports no family history of colon cancer or autoimmune diseases like inflammatory bowel disease. No urinary issues, dark or bloody stools, or abdominal pain are reported. She mentions having a hiatal hernia but has not undergone an endoscopy.        She  reports that she has never smoked. She has never used smokeless tobacco. She reports that she does not drink alcohol and does not use drugs.  RELEVANT GI HISTORY, IMAGING AND LABS: Results   DIAGNOSTIC Colonoscopy: Large cecal polyp excised, benign (03/19/2021)      CBC    Component Value Date/Time   WBC 6.5 08/16/2024 1402   WBC 5.9 07/10/2024 1533   RBC 4.45 08/16/2024 1402   RBC 4.35 07/10/2024 1533   HGB 12.3 08/16/2024 1402   HCT 39.4 08/16/2024 1402   PLT 366 08/16/2024 1402   MCV 89 08/16/2024 1402   MCH 27.6 08/16/2024 1402   MCH 27.8 07/10/2024 1533   MCHC 31.2 (L) 08/16/2024 1402   MCHC 31.4 (L) 07/10/2024 1533   RDW 12.4 08/16/2024 1402   LYMPHSABS 1.7 06/25/2024 1950   MONOABS 0.7 06/25/2024 1950   EOSABS 118 07/10/2024 1533   BASOSABS 30 07/10/2024 1533   Recent Labs    11/22/23 1327 02/07/24 1324 06/25/24 1950 06/26/24 0422 06/27/24 0434 06/29/24 0445 07/10/24 1533 08/16/24 1402  HGB 13.9 13.4 11.4* 10.9* 11.5* 11.4* 12.1 12.3    CMP     Component Value Date/Time   NA 137 08/16/2024 1402   K 4.1 08/16/2024 1402   CL 98 08/16/2024 1402   CO2 21 08/16/2024 1402   GLUCOSE 260 (H) 08/16/2024 1402   GLUCOSE 193 (H) 07/10/2024 1533   BUN 8 08/16/2024 1402   CREATININE 0.75 08/16/2024 1402   CREATININE 0.66 07/10/2024 1533   CALCIUM  9.7 08/16/2024 1402   PROT 6.6 08/16/2024 1402   ALBUMIN 4.0 08/16/2024 1402   AST 14 08/16/2024 1402   ALT 20 08/16/2024 1402   ALKPHOS 96 08/16/2024 1402   BILITOT 0.7 08/16/2024 1402   GFRNONAA >60 06/29/2024 0445   GFRAA >90 12/12/2014 0600      Latest Ref Rng & Units  08/16/2024    2:02 PM 07/10/2024    3:33 PM 02/07/2024    1:24 PM  Hepatic Function  Total Protein 6.0 - 8.5 g/dL 6.6  6.9  6.9   Albumin 3.8 - 4.9 g/dL 4.0   4.3   AST 0 - 40 IU/L 14  15  14    ALT 0 - 32 IU/L 20  15  15    Alk Phosphatase 44 - 121 IU/L 96   94   Total Bilirubin 0.0 - 1.2 mg/dL 0.7  0.8  0.3       Current Medications:   Current Outpatient Medications (Endocrine & Metabolic):    insulin  glargine-yfgn (SEMGLEE ) 100 UNIT/ML Pen, Inject 20 Units into the skin daily. May increase up to 30 units daily if instructed by your provider.   medroxyPROGESTERone  Acetate 150 MG/ML SUSY, Inject 1 mL (150 mg total) into the muscle every 3 (three) months.   metFORMIN  (GLUCOPHAGE -XR) 500 MG 24 hr tablet, Take 2 tablets (1,000 mg total) by mouth 2 (two) times daily with a meal.   Semaglutide ,  2 MG/DOSE, (OZEMPIC , 2 MG/DOSE,) 8 MG/3ML SOPN, Inject 2 mg as directed once a week.  Current Facility-Administered Medications (Endocrine & Metabolic):    testosterone  cypionate (DEPOTESTOSTERONE CYPIONATE) injection 150 mg  Current Outpatient Medications (Cardiovascular):    atorvastatin  (LIPITOR) 40 MG tablet, Take 2 tablets (80 mg total) by mouth daily.   valsartan -hydrochlorothiazide  (DIOVAN -HCT) 80-12.5 MG tablet, Take 1 tablet by mouth daily.     Current Outpatient Medications (Analgesics):    aspirin  81 MG tablet, Take 81 mg by mouth at bedtime.      Current Outpatient Medications (Other):    ascorbic acid  (VITAMIN C ) 500 MG tablet, Take 1 tablet (500 mg total) by mouth 2 (two) times daily.   Blood Glucose Monitoring Suppl (TRUE METRIX METER) w/Device KIT, Use to check blood sugar up to twice daily.   buPROPion  (WELLBUTRIN  XL) 150 MG 24 hr tablet, Take 1 tablet (150 mg total) by mouth every morning.   Continuous Glucose Sensor (FREESTYLE LIBRE 3 PLUS SENSOR) MISC, Change sensor every 15 days.   ergocalciferol  (VITAMIN D2) 1.25 MG (50000 UT) capsule, Take 1 capsule by mouth once a  week.   escitalopram  (LEXAPRO ) 20 MG tablet, Take 1 tablet (20 mg total) by mouth daily.   Glucose Blood (BLOOD GLUCOSE TEST STRIPS) STRP, Use to check blood sugar up to twice daily.   Multiple Vitamin (MULTIVITAMIN) tablet, Take 1 tablet by mouth daily with breakfast.   Na Sulfate-K Sulfate-Mg Sulfate concentrate (SUPREP BOWEL PREP KIT) 17.5-3.13-1.6 GM/177ML SOLN, Take 1 kit (354 mLs total) by mouth as directed.   omeprazole  (PRILOSEC) 20 MG capsule, Take 1 capsule (20 mg total) by mouth daily.   pregabalin  (LYRICA ) 150 MG capsule, Take 1 capsule (150 mg total) by mouth 2 (two) times daily.   traZODone  (DESYREL ) 150 MG tablet, Take 1 tablet (150 mg total) by mouth at bedtime.   triamcinolone  (KENALOG ) 0.025 % ointment, Apply 1 Application topically 2 (two) times daily.   zinc  sulfate, 50mg  elemental zinc , 220 (50 Zn) MG capsule, Take 1 capsule (220 mg total) by mouth daily.   ergocalciferol  (VITAMIN D2) 1.25 MG (50000 UT) capsule, Take 1 capsule by mouth once a week. (Patient not taking: Reported on 09/22/2024)   Insulin  Pen Needle (PEN NEEDLES) 32G X 4 MM MISC, Use as directed to inject insulin  once daily. (Patient not taking: Reported on 09/22/2024)   Lancet Device MISC, Use to check blood sugar up to twice daily. May substitute to any manufacturer covered by patient's insurance. (Patient not taking: Reported on 09/22/2024)   meclizine  (ANTIVERT ) 25 MG tablet, Take 1 tablet (25 mg total) by mouth 3 (three) times daily as needed for dizziness. (Patient not taking: Reported on 09/22/2024)   Multiple Vitamins-Minerals (HAIR SKIN NAILS) CAPS, Take 2 capsules by mouth at bedtime. (Patient not taking: Reported on 09/22/2024)   triamcinolone  (KENALOG ) 0.025 % ointment, Apply 1 Application topically 2 (two) times daily. (Patient not taking: Reported on 09/22/2024)   Medical History:  Past Medical History:  Diagnosis Date   Arthritis    Depression    Diabetes mellitus    Fatty liver    Hernia,  abdominal    Hypercholesteremia    IBS (irritable bowel syndrome)    Allergies: No Known Allergies   Surgical History:  She  has a past surgical history that includes Cesarean section; Cholecystectomy; and Incision and drainage of wound (Left, 06/27/2024). Family History:  Her family history includes Breast cancer in her sister; Diabetes type II in her  paternal grandmother; Fibroids in her maternal grandmother, mother, and sister; Heart failure in her mother; Hypertension in her mother.  REVIEW OF SYSTEMS  : All other systems reviewed and negative except where noted in the History of Present Illness.  PHYSICAL EXAM: BP 122/76   Pulse 99   Ht 6' (1.829 m)   Wt 252 lb (114.3 kg)   BMI 34.18 kg/m  Physical Exam   GENERAL APPEARANCE: Well nourished, in no apparent distress. HEENT: No cervical lymphadenopathy, unremarkable thyroid, sclerae anicteric, conjunctiva pink. RESPIRATORY: Respiratory effort normal, breath sounds equal bilaterally without rales, rhonchi, or wheezing. CARDIO: Regular rate and rhythm with no murmurs, rubs, or gallops, peripheral pulses intact. ABDOMEN: Soft, non-distended, active bowel sounds in all four quadrants, no tenderness to palpation, no rebound, no mass appreciated, abdomen normal. RECTAL: Declines. MUSCULOSKELETAL: Full range of motion, normal gait, without edema. SKIN: Dry, intact without rashes or lesions. No jaundice. NEURO: Alert, oriented, no focal deficits. PSYCH: Cooperative, normal mood and affect.      Alan JONELLE Coombs, PA-C 2:42 PM

## 2024-09-22 ENCOUNTER — Other Ambulatory Visit (HOSPITAL_BASED_OUTPATIENT_CLINIC_OR_DEPARTMENT_OTHER): Payer: Self-pay

## 2024-09-22 ENCOUNTER — Encounter: Payer: Self-pay | Admitting: Physician Assistant

## 2024-09-22 ENCOUNTER — Other Ambulatory Visit: Payer: Self-pay

## 2024-09-22 ENCOUNTER — Ambulatory Visit: Admitting: Physician Assistant

## 2024-09-22 VITALS — BP 122/76 | HR 99 | Ht 72.0 in | Wt 252.0 lb

## 2024-09-22 DIAGNOSIS — K582 Mixed irritable bowel syndrome: Secondary | ICD-10-CM | POA: Diagnosis not present

## 2024-09-22 DIAGNOSIS — E1165 Type 2 diabetes mellitus with hyperglycemia: Secondary | ICD-10-CM | POA: Diagnosis not present

## 2024-09-22 DIAGNOSIS — R110 Nausea: Secondary | ICD-10-CM

## 2024-09-22 DIAGNOSIS — E669 Obesity, unspecified: Secondary | ICD-10-CM | POA: Diagnosis not present

## 2024-09-22 DIAGNOSIS — Z6834 Body mass index (BMI) 34.0-34.9, adult: Secondary | ICD-10-CM

## 2024-09-22 DIAGNOSIS — Z860101 Personal history of adenomatous and serrated colon polyps: Secondary | ICD-10-CM

## 2024-09-22 DIAGNOSIS — Z794 Long term (current) use of insulin: Secondary | ICD-10-CM

## 2024-09-22 MED ORDER — NA SULFATE-K SULFATE-MG SULF 17.5-3.13-1.6 GM/177ML PO SOLN: 1.0000 | ORAL | 0 refills | Status: AC

## 2024-09-22 MED ORDER — NA SULFATE-K SULFATE-MG SULF 17.5-3.13-1.6 GM/177ML PO SOLN
1.0000 | ORAL | 0 refills | Status: DC
  Filled 2024-09-22: qty 354, 1d supply, fill #0

## 2024-09-22 NOTE — Patient Instructions (Addendum)
 _______________________________________________________  If your blood pressure at your visit was 140/90 or greater, please contact your primary care physician to follow up on this.  _______________________________________________________  If you are age 51 or older, your body mass index should be between 23-30. Your Body mass index is 34.18 kg/m. If this is out of the aforementioned range listed, please consider follow up with your Primary Care Provider.  If you are age 43 or younger, your body mass index should be between 19-25. Your Body mass index is 34.18 kg/m. If this is out of the aformentioned range listed, please consider follow up with your Primary Care Provider.   ________________________________________________________  The [facility] providers would like to encourage you to use MYCHART to communicate with providers for non-urgent requests or questions.  Due to long hold times on the telephone, sending your provider a message by [facility] may be a faster and more efficient way to get a response.  Please allow 48 business hours for a response.  Please remember that this is for non-urgent requests.  _______________________________________________________  [facility] is using a team-based approach to care.  Your team is made up of your doctor and two to three APPS. Our APPS (Nurse Practitioners and Physician Assistants) work with your physician to ensure care continuity for you. They are fully qualified to address your health concerns and develop a treatment plan. They communicate directly with your gastroenterologist to care for you. Seeing the Advanced Practice Practitioners on your physician's team can help you by facilitating care more promptly, often allowing for earlier appointments, access to diagnostic testing, procedures, and other specialty referrals.   You have been scheduled for a colonoscopy. Please follow written instructions given to you at your visit today.    If you use inhalers (even only as needed), please bring them with you on the day of your procedure.  DO NOT TAKE 7 DAYS PRIOR TO TEST- Trulicity (dulaglutide) Ozempic , Wegovy  (semaglutide ) Mounjaro (tirzepatide) Bydureon Bcise (exanatide extended release)  DO NOT TAKE 1 DAY PRIOR TO YOUR TEST Rybelsus  (semaglutide ) Adlyxin (lixisenatide) Victoza (liraglutide) Byetta (exanatide) ___________________________________________________________________________   First do a trial off milk/lactose products if you use them.  Add fiber like benefiber or citracel once a day Increase activity Can do trial of IBGard which is over the counter for AB pain- Take 1-2 capsules once a day for maintence or twice a day during a flare    FODMAP stands for fermentable oligo-, di-, mono-saccharides and polyols (1). These are the scientific terms used to classify groups of carbs that are difficult for our body to digest and that are notorious for triggering digestive symptoms like bloating, gas, loose stools and stomach pain.   You can try low FODMAP diet  - start with eliminating just one column at a time that you feel may be a trigger for you. - the table at the very bottom contains foods that are low in FODMAPs   Sometimes trying to eliminate the FODMAP's from your diet is difficult or tricky, if you are stuggling with trying to do the elimination diet you can try an enzyme.  There is a food enzymes that you sprinkle in or on your food that helps break down the FODMAP. You can read more about the enzyme by going to this site: https://fodzyme.com/   Understanding Your Weekly GLP-1 Injection  A helpful guide to managing common side effects  You are on a once-weekly injectable medication in the GLP-1 receptor agonist class. These medications can be  very effective for blood sugar control, weight loss, and heart protection, fatty liver or OSA, but they can also come with some side effects that are  important to understand. The good news is: most side effects can be managed with a few adjustments.  1. Gastroparesis-Like Symptoms These medications slow down your stomach to help you feel full longer -- great for weight loss and blood sugar control, but they can sometimes cause symptoms that feel like gastroparesis (slow stomach emptying). Symptoms may include: -Feeling full quickly when eating -Nausea or vomiting -Bloating or abdominal discomfort -Worsening heartburn or reflux -Acid regurgitation -Stomach spasms or tightness What you can do: ??? Eat small, frequent meals (4-6 per day) ?? Drink fluids between meals, not during ?? Avoid high-fiber foods (like raw veggies or whole grains); cook your veggies well ?? Spread protein throughout the day (try [place] yogurt, eggs, soft meats, Glucerna, milk) ?? Choose soft foods you can mash with a fork ?? Switch to pured foods or liquids during flare-ups ?? Consider reading: Living Well with Gastroparesis by Camelia Bone ?? Downloadable Diet Guide: [facility] Gastroparesis Diet PDF  ?? Tip: Try following a gastroparesis-friendly diet on the day of your injection and the day after, when the medication's effect is strongest.  2. Constipation Since this medication slows down your digestive system, constipation is very common. Tips to help: ?? Drink plenty of water ???? Stay active with regular exercise ?? Add fiber-rich but gentle foods like kiwi ?? Try a low-dose magnesium  supplement at night ?? Use MiraLAX  (half to one capful daily) if constipation becomes more frequent, especially if your dose increases  If these strategies don't help, talk to your provider -- they may recommend or prescribe other treatments.  3. When to Call the Doctor or Go to the ER While rare, this medication can slightly increase your risk of serious conditions like: Pancreatitis (inflammation of the pancreas) Gallstones or gallbladder problems Watch  for these signs and seek help if you experience: Severe abdominal pain (especially in the upper belly or that radiates to your back) Pain in the right upper side of your abdomen Nausea, vomiting, fever, or chills that don't go away ?? Call your provider or go to the ER if these occur.  4. Who Should NOT Take This Medication? This medication should be avoided if you have: A personal history of pancreatitis  A personal or family history of medullary thyroid cancer A condition called Multiple Endocrine Neoplasia Syndrome Type 2 (MEN2)  Final Note If the side effects are too bothersome, remember: Most symptoms will go away if you stop the medication. But many people tolerate it well after the first few weeks, especially with the right strategies in place.  Due to recent changes in healthcare laws, you may see the results of your imaging and laboratory studies on MyChart before your provider has had a chance to review them.  We understand that in some cases there may be results that are confusing or concerning to you. Not all laboratory results come back in the same time frame and the provider may be waiting for multiple results in order to interpret others.  Please give us  48 hours in order for your provider to thoroughly review all the results before contacting the office for clarification of your results.   Thank you for entrusting me with your care and choosing [facility].  Alan Coombs, PA-C

## 2024-09-25 ENCOUNTER — Other Ambulatory Visit: Payer: Self-pay

## 2024-09-29 ENCOUNTER — Other Ambulatory Visit

## 2024-10-02 ENCOUNTER — Other Ambulatory Visit: Payer: Self-pay

## 2024-10-02 ENCOUNTER — Other Ambulatory Visit (HOSPITAL_BASED_OUTPATIENT_CLINIC_OR_DEPARTMENT_OTHER): Payer: Self-pay

## 2024-10-02 ENCOUNTER — Other Ambulatory Visit (HOSPITAL_COMMUNITY): Payer: Self-pay

## 2024-10-06 ENCOUNTER — Other Ambulatory Visit: Payer: Self-pay

## 2024-10-06 ENCOUNTER — Other Ambulatory Visit (HOSPITAL_BASED_OUTPATIENT_CLINIC_OR_DEPARTMENT_OTHER): Payer: Self-pay

## 2024-10-06 ENCOUNTER — Other Ambulatory Visit (HOSPITAL_COMMUNITY): Payer: Self-pay

## 2024-10-06 DIAGNOSIS — E785 Hyperlipidemia, unspecified: Secondary | ICD-10-CM

## 2024-10-06 DIAGNOSIS — E1165 Type 2 diabetes mellitus with hyperglycemia: Secondary | ICD-10-CM

## 2024-10-06 DIAGNOSIS — Z79899 Other long term (current) drug therapy: Secondary | ICD-10-CM

## 2024-10-06 MED ORDER — ATORVASTATIN CALCIUM 40 MG PO TABS
80.0000 mg | ORAL_TABLET | Freq: Every day | ORAL | 3 refills | Status: AC
  Filled 2024-10-06 – 2024-10-23 (×2): qty 60, 30d supply, fill #0
  Filled 2024-11-17: qty 60, 30d supply, fill #1
  Filled 2024-12-22: qty 60, 30d supply, fill #2

## 2024-10-06 MED ORDER — INSULIN GLARGINE 100 UNIT/ML SOLOSTAR PEN
20.0000 [IU] | PEN_INJECTOR | Freq: Every day | SUBCUTANEOUS | 11 refills | Status: AC
  Filled 2024-10-06: qty 9, 45d supply, fill #0
  Filled 2024-10-30: qty 9, 30d supply, fill #0
  Filled 2024-11-27 – 2024-12-11 (×2): qty 9, 30d supply, fill #1

## 2024-10-06 NOTE — Progress Notes (Signed)
 10/06/2024 Name: Erica [name] MRN: [id number] DOB: 10/01/1971  Chief Complaint  Patient presents with   Diabetes    Erica [name] is a 53 y.o. year old female who presented for a telephone visit.   They were referred to the pharmacist by their PCP for assistance in managing diabetes.  PMH includes HTN, T2DM, HLD, depression.   Subjective: Patient was last seen by PCP, [name], NP, on 07/18/24. At last visit, A1C continued to be elevated at 11.3%. Patient reported not taking her medications as prescribed. She was recently hospitalized in June 2025 for cellulitis and she is now following with ID. She was discharged from the hospital on basal insulin  + Humalog  per sliding scale, in addition to her Ozempic  and metformin . Patient was outreached be pharmacy via telephone on 07/26/24. She reported occasionally missing doses of her medications, but glycemic control was improved with TIR of 69% per her [facility] report. She was instructed to focus on adherence to insulin  glargine, Ozempic , and metformin  and hold off on using Humalog  TID with meals per sliding scale (since she had not been using at all over the past few weeks). She was seen in office with PCP on 08/16/24. She reported being out of Semglee  at that time and was not feeling well. It was dispensed for a 30ds on 08/17/24. At pharmacy call on 08/25/24, we updated her CGM to the correct order (FL3+).   Today, patient reports doing well.She has one insulin  pen left, but has had difficulty getting refills at the pharmacy (Semglee  on backorder). She has not been wearing CGM but now has correct app downloaded and has one sensor remaining that she plans to connect soon.    Care Team: Primary Care Provider: Borer Bascom RAMAN, NP ; Next Scheduled Visit: 10/18/24  Medication Access/Adherence  Current Pharmacy:  [facility] [address] [place] [address] Phone: [phone] Fax:  [phone]  [facility] [phone] [place], [place] - [address] AT [facility] [address] [facility] [address] [facility] [address] [place] [address] Phone: [phone] Fax: [phone]   Patient reports affordability concerns with their medications: No  Patient reports access/transportation concerns to their pharmacy: No  Patient reports adherence concerns with their medications:  Yes  - history of skipping doses   Diabetes:  Current medications: insulin  glargine (Semglee ) 20 units daily, metformin  XR 1000 mg BID with meals, Ozempic  2 mg weekly (Fridays) (she is holding Ozempic  for one week starting today due to upcoming colonoscopy).  Current glucose readings:  Freestyle Libre 3 CGM - $15/1 mo supply - reports this will be affordable for her  Last available download from 08/15/24 - no more recent data available      8/27 to 08/15/24: was out of Semglee   Patient denies hyperglycemic symptoms including polyuria, polydipsia, polyphagia, nocturia, neuropathy, blurred vision.  Current meal patterns: did not discuss at length today. Reports her appetite is variable and she does not eat at consistent times. She is eating 1-2 meals per day. Sometimes eats a bowl of cereal before bed.  Current physical activity: did not discuss today  Current medication access support: none  Hypertension:  Current medications: valsartan -hydrochlorothiazide  80-12.5 mg daily  Patient has anautomated, upper arm home BP cuff - not currently checking  Patient reports hypotensive s/sx including dizziness, lightheadedness - occasionally when she is getting out of bed in the morning   Hyperlipidemia/ASCVD Risk Reduction  Current lipid lowering medications: atorvastatin  40 mg daily  Clinical ASCVD: No  The 10-year ASCVD risk score (Arnett DK, et al., 2019) is: 13.1%   Values used to calculate the score:     Age: 16 years     Clincally relevant sex: Female     Is Non-Hispanic African  American: Yes     Diabetic: Yes     Tobacco smoker: No     Systolic Blood Pressure: 122 mmHg     Is BP treated: Yes     HDL Cholesterol: 32 mg/dL     Total Cholesterol: 183 mg/dL   Objective:  BP Readings from Last 3 Encounters:  09/22/24 122/76  09/08/24 (!) 149/82  08/16/24 131/82    Lab Results  Component Value Date   HGBA1C 11.3 (H) 06/26/2024   HGBA1C 11.1 (A) 02/17/2024   HGBA1C 11.4 (H) 02/07/2024       Latest Ref Rng & Units 08/16/2024    2:02 PM 07/10/2024    3:33 PM 06/29/2024    4:45 AM  BMP  Glucose 70 - 99 mg/dL 739  806  806   BUN 6 - 24 mg/dL 8  6  <5   Creatinine 9.42 - 1.00 mg/dL 9.24  9.33  9.38   BUN/Creat Ratio 9 - 23 11  9     Sodium 134 - 144 mmol/L 137  137  138   Potassium 3.5 - 5.2 mmol/L 4.1  4.2  3.8   Chloride 96 - 106 mmol/L 98  101  105   CO2 20 - 29 mmol/L 21  22  24    Calcium  8.7 - 10.2 mg/dL 9.7  9.6  8.7     Lab Results  Component Value Date   CHOL 183 02/22/2024   HDL 32 (L) 02/22/2024   LDLCALC 96 02/22/2024   TRIG 329 (H) 02/22/2024   CHOLHDL 5.7 (H) 02/22/2024    Medications Reviewed Today     Reviewed by Brinda Lorain SQUIBB, RPH (Pharmacist) on 10/06/24 at [phone]  Med List Status: <None>   Medication Order Taking? Sig Documenting Provider Last Dose Status Informant    Discontinued 11/10/20 1306            Med Note (MCKEOWN, GUSTAV JONELLE Repress Nov 10, 2020 12:39 PM) Pt does not take  ascorbic acid  (VITAMIN C ) 500 MG tablet [id number]  Take 1 tablet (500 mg total) by mouth 2 (two) times daily. Erica Labella, MD  Active   aspirin  81 MG tablet [id number]  Take 81 mg by mouth at bedtime.  [provider]  Active Self  atorvastatin  (LIPITOR) 40 MG tablet [id number]  Take 2 tablets (80 mg total) by mouth daily. [name], NP  Active   Blood Glucose Monitoring Suppl (TRUE METRIX METER) w/Device KIT [id number]  Use to check blood sugar up to twice daily. [name], NP  Active            Med Note STEFFI, ALEXANDRIA   Mon Jun 26, 2024 10:10 AM)    buPROPion  (WELLBUTRIN  XL) 150 MG 24 hr tablet [id number] Yes Take 1 tablet (150 mg total) by mouth every morning. [name], NP  Active   Continuous Glucose Sensor (FREESTYLE LIBRE 3 PLUS SENSOR) MISC [id number]  Change sensor every 15 days.  Patient not taking: Reported on 10/06/2024   [name], NP  Active   ergocalciferol  (VITAMIN D2) 1.25 MG (50000 UT) capsule [id number] Yes Take 1 capsule by mouth once a week. [name], NP  Active Self   Patient not taking:   Discontinued 10/06/24 0924 (Duplicate) escitalopram  (LEXAPRO ) 20 MG tablet [id number] Yes Take 1 tablet (20 mg total) by mouth daily. [name], NP  Active Self    Discontinued 11/10/20 1306            Med Note RUSH, GUSTAV JONELLE Repress Nov 10, 2020 12:38 PM) Pt reports she does not take  Glucose Blood (BLOOD GLUCOSE TEST STRIPS) STRP [id number]  Use to check blood sugar up to twice daily. [name], NP  Active            Med Note MARISA, NATHANEL LOISE Kitchens Jun 26, 2024  4:41 PM)    insulin  glargine (LANTUS ) 100 UNIT/ML Solostar Pen [id number] Yes Inject 20 Units into the skin daily. May increase up to 30 units daily if instructed by your provider. [name], NP  Active   insulin  glargine-yfgn (SEMGLEE ) 100 UNIT/ML Pen [id number] Yes Inject 20 Units into the skin daily. May increase up to 30 units daily if instructed by your provider. [name], NP  Active   Insulin  Pen Needle (PEN NEEDLES) 32G X 4 MM MISC [id number]  Use as directed to inject insulin  once daily.  Patient not taking: Reported on 09/22/2024   [name], NP  Active   Lancet Device MISC [id number]  Use to check blood sugar up to twice daily. May substitute to any manufacturer covered by patient's insurance.  Patient not taking: Reported on 09/22/2024   [name], NP  Active            Med Note MARISA, NATHANEL LOISE Kitchens Jun 26, 2024  4:41 PM)    meclizine  (ANTIVERT ) 25 MG tablet [id number]  Take 1 tablet (25  mg total) by mouth 3 (three) times daily as needed for dizziness.  Patient not taking: Reported on 09/22/2024   Ula Prentice JONELLE, MD  Active Self  medroxyPROGESTERone  Acetate 150 MG/ML SUSY [id number]  Inject 1 mL (150 mg total) into the muscle every 3 (three) months. Zina Jerilynn LABOR, MD  Active     Discontinued 09/30/23 1422   metFORMIN  (GLUCOPHAGE -XR) 500 MG 24 hr tablet [id number] Yes Take 2 tablets (1,000 mg total) by mouth 2 (two) times daily with a meal. [name], NP  Active Self           Med Note MARISA, NATHANEL LOISE   Mon Jun 26, 2024  4:41 PM)    Multiple Vitamin (MULTIVITAMIN) tablet [id number]  Take 1 tablet by mouth daily with breakfast. [provider]  Active Self  Multiple Vitamins-Minerals (HAIR SKIN NAILS) CAPS [id number]  Take 2 capsules by mouth at bedtime.  Patient not taking: Reported on 09/22/2024   [provider]  Active Self  Na Sulfate-K Sulfate-Mg Sulfate concentrate (SUPREP BOWEL PREP KIT) 17.5-3.13-1.6 GM/177ML SOLN [id number]  Take 1 kit (354 mLs total) by mouth as directed. Craig Alan JONELLE, PA-C  Active   omeprazole  (PRILOSEC) 20 MG capsule [id number] Yes Take 1 capsule (20 mg total) by mouth daily. [name], NP  Active   pregabalin  (LYRICA ) 150 MG capsule [id number]  Take 1 capsule (150 mg total) by mouth 2 (two) times daily.  Patient not taking: Reported on 10/06/2024   [name], NP  Active   Semaglutide , 2 MG/DOSE, (OZEMPIC , 2 MG/DOSE,) 8 MG/3ML SOPN [id number] Yes Inject 2 mg as directed once a week. [name], NP  Active Self  testosterone  cypionate (DEPOTESTOSTERONE CYPIONATE) injection 150 mg [id number]   Nichols, Tonya S, NP  Active   traZODone  (DESYREL ) 150 MG tablet [id number]  Take 1 tablet (150 mg total) by mouth at bedtime. [name], NP  Active Self  triamcinolone  (KENALOG ) 0.025 % ointment [id number]  Apply 1 Application topically 2 (two) times daily. [name], NP  Active   triamcinolone  (KENALOG ) 0.025  % ointment [id number]  Apply 1 Application topically 2 (two) times daily.  Patient not taking: Reported on 09/22/2024   [name], NP  Active   valsartan -hydrochlorothiazide  (DIOVAN -HCT) 80-12.5 MG tablet [id number] Yes Take 1 tablet by mouth daily. [name], NP  Active Self  zinc  sulfate, 50mg  elemental zinc , 220 (50 Zn) MG capsule [id number]  Take 1 capsule (220 mg total) by mouth daily. Erica Labella, MD  Active               Assessment/Plan:   Diabetes: - Currently uncontrolled with most recent A1C of 11.3% above goal <7% in the setting of medication nonadherence. However, BG are improved when she is adherent to her insulin  per AGP report, though there is no recent data to review. Encouraged her to reapply CGM. She reports she is tolerating her other medications well. Encouraged her to reach out if BG are uncontrolled while she is holding Ozempic  for colonoscopy, as we can increase her dose of insulin .  - Last UACR August 2025 - 24 mg/g - Reviewed long term cardiovascular and renal outcomes of uncontrolled blood sugar - Reviewed goal A1c, goal fasting, and goal 2 hour post prandial glucose - Reviewed hypoglycemia management plan and the rule of 15 - Recommend to continue Ozempic  2 mg weekly - Recommend to continue insulin  glargine 20 units daily - Requested fill from [facility] (sent Lantus  solostar as requested) - Recommend to continue metformin  XR 1000 mg BID - Recommend to check glucose continuously using FL3 CGM.  - Next A1C due now    Hypertension: - Currently controlled with clinic BP consistently below goal less than 130/80. Patient is having possible s/sx of hypotension with dizziness upon waking, but she is not currently monitoring. She also reports occasionally skipping meals, which could lead to dizziness. Encouraged her to monitor at home to evaluate for low BP < 100/60 mmHg with symptoms. - Reviewed long term cardiovascular and renal  outcomes of uncontrolled blood pressure - Reviewed appropriate blood pressure monitoring technique and reviewed goal blood pressure. Recommended to check home blood pressure and heart rate  once daily and keep a log to bring to upcoming appointments - Recommend to continue valsartan -hydrochlorothiazide  80-12.5 mg daily     Hyperlipidemia/ASCVD Risk Reduction: - Currently uncontrolled with most recent LDL-C of 96 mg/dL above goal < 70 mg/dL given U7IF + comorbidities. TG also elevated > 300 mg/dL, likely in the setting of poor glycemic control. Expect improvement if she is able to remain adherent to her medications. High intensity statin indicated. - Reviewed long term complications of uncontrolled cholesterol - Recommend to continue atorvastatin  40 mg daily   Patient verbalized understanding of treatment plan.   Follow Up Plan:  Pharmacist 11/17/24 PCP clinic visit 10/18/24   Lorain Baseman, PharmD [facility] [phone]

## 2024-10-09 ENCOUNTER — Other Ambulatory Visit: Payer: Self-pay

## 2024-10-09 ENCOUNTER — Other Ambulatory Visit (HOSPITAL_BASED_OUTPATIENT_CLINIC_OR_DEPARTMENT_OTHER): Payer: Self-pay

## 2024-10-11 NOTE — Progress Notes (Signed)
 [facility] History and Physical   Primary Care Physician:  [name], NP   Reason for Procedure:  History of advanced adenoma-cecal tubulovillous adenoma  Plan:    Colonoscopy     HPI: Erica [name] is a 53 y.o. female undergoing surveillance colonoscopy for history of advanced adenoma.  Patient underwent colonoscopy in 2022 that revealed a 14 mm cecal TVA.  No family history of colorectal cancer or polyps.  Patient denies current symptoms of rectal bleeding or change in bowel habits.   Past Medical History:  Diagnosis Date   Arthritis    Depression    Diabetes mellitus    Fatty liver    Hernia, abdominal    Hypercholesteremia    IBS (irritable bowel syndrome)     Past Surgical History:  Procedure Laterality Date   CESAREAN SECTION     CHOLECYSTECTOMY     INCISION AND DRAINAGE OF WOUND Left 06/27/2024   Procedure: Irrigation and debridement of left fifth toe;  Surgeon: Georgina Ozell LABOR, MD;  Location: [facility] OR;  Service: Orthopedics;  Laterality: Left;  Irrigation and debridement of left fifth toe    Prior to Admission medications   Medication Sig Start Date End Date Taking? Authorizing Provider  ascorbic acid  (VITAMIN C ) 500 MG tablet Take 1 tablet (500 mg total) by mouth 2 (two) times daily. 06/29/24   Akula, Vijaya, MD  aspirin  81 MG tablet Take 81 mg by mouth at bedtime.     [provider]  atorvastatin  (LIPITOR) 40 MG tablet Take 2 tablets (80 mg total) by mouth daily. 10/06/24   [name], NP  Blood Glucose Monitoring Suppl (TRUE METRIX METER) w/Device KIT Use to check blood sugar up to twice daily. 08/03/23   [name], NP  buPROPion  (WELLBUTRIN  XL) 150 MG 24 hr tablet Take 1 tablet (150 mg total) by mouth every morning. 07/24/24   [name], NP  Continuous Glucose Sensor (FREESTYLE LIBRE 3 PLUS SENSOR) MISC Change sensor every 15 days. Patient not taking: Reported on 10/06/2024 07/12/24   [name], NP   ergocalciferol  (VITAMIN D2) 1.25 MG (50000 UT) capsule Take 1 capsule by mouth once a week. 02/23/24   [name], NP  escitalopram  (LEXAPRO ) 20 MG tablet Take 1 tablet (20 mg total) by mouth daily. 09/22/23   [name], NP  Glucose Blood (BLOOD GLUCOSE TEST STRIPS) STRP Use to check blood sugar up to twice daily. 08/03/23   [name], NP  insulin  glargine (LANTUS ) 100 UNIT/ML Solostar Pen Inject 20 Units into the skin daily. May increase up to 30 units daily if instructed by your provider. 10/06/24   [name], NP  insulin  glargine-yfgn (SEMGLEE ) 100 UNIT/ML Pen Inject 20 Units into the skin daily. May increase up to 30 units daily if instructed by your provider. 08/25/24   [name], NP  Insulin  Pen Needle (PEN NEEDLES) 32G X 4 MM MISC Use as directed to inject insulin  once daily. Patient not taking: Reported on 09/22/2024 08/25/24   [name], NP  Lancet Device MISC Use to check blood sugar up to twice daily. May substitute to any manufacturer covered by patient's insurance. Patient not taking: Reported on 09/22/2024 08/03/23   [name], NP  meclizine  (ANTIVERT ) 25 MG tablet Take 1 tablet (25 mg total) by mouth 3 (three) times daily as needed for dizziness. Patient not taking: Reported on 09/22/2024 11/22/23   Ula Prentice SAUNDERS, MD  medroxyPROGESTERone  Acetate 150 MG/ML SUSY Inject 1 mL (150 mg total) into the muscle every 3 (three) months. 07/26/24   Zina Jerilynn LABOR, MD  metFORMIN  (GLUCOPHAGE -XR) 500 MG 24 hr tablet Take 2 tablets (1,000 mg total) by mouth 2 (two) times daily with a meal. 06/08/24   Nichols, Tonya S, NP  Multiple Vitamin (MULTIVITAMIN) tablet Take 1 tablet by mouth daily with breakfast.    [provider]  Multiple Vitamins-Minerals (HAIR SKIN NAILS) CAPS Take 2 capsules by mouth at bedtime. Patient not taking: Reported on 09/22/2024    [provider]  Na Sulfate-K Sulfate-Mg Sulfate concentrate (SUPREP BOWEL PREP KIT)  17.5-3.13-1.6 GM/177ML SOLN Take 1 kit (354 mLs total) by mouth as directed. 09/22/24   Craig Alan SAUNDERS, PA-C  omeprazole  (PRILOSEC) 20 MG capsule Take 1 capsule (20 mg total) by mouth daily. 07/24/24   [name], NP  pregabalin  (LYRICA ) 150 MG capsule Take 1 capsule (150 mg total) by mouth 2 (two) times daily. Patient not taking: Reported on 10/06/2024 09/01/24   [name], NP  Semaglutide , 2 MG/DOSE, (OZEMPIC , 2 MG/DOSE,) 8 MG/3ML SOPN Inject 2 mg as directed once a week. 06/21/24   [name], NP  traZODone  (DESYREL ) 150 MG tablet Take 1 tablet (150 mg total) by mouth at bedtime. 02/17/24   Nichols, Tonya S, NP  triamcinolone  (KENALOG ) 0.025 % ointment Apply 1 Application topically 2 (two) times daily. 08/15/24   Nichols, Tonya S, NP  triamcinolone  (KENALOG ) 0.025 % ointment Apply 1 Application topically 2 (two) times daily. Patient not taking: Reported on 09/22/2024 08/16/24   [name], NP  valsartan -hydrochlorothiazide  (DIOVAN -HCT) 80-12.5 MG tablet Take 1 tablet by mouth daily. 12/23/23   [name], NP  zinc  sulfate, 50mg  elemental zinc , 220 (50 Zn) MG capsule Take 1 capsule (220 mg total) by mouth daily. 06/30/24   Akula, Vijaya, MD  amitriptyline (ELAVIL) 50 MG tablet amitriptyline 50 mg tablet  Take 1 tablet(s) every day by oral route as directed for 30 days.  11/10/20  [provider]  gabapentin (NEURONTIN) 300 MG capsule Take 300 mg by mouth.  11/10/20  [provider]  metFORMIN  (GLUCOPHAGE ) 1000 MG tablet Take 1 tablet (1,000 mg total) by mouth 2 (two) times daily with a meal. 09/22/23   [name], NP    Current Outpatient Medications  Medication Sig Dispense Refill   ascorbic acid  (VITAMIN C ) 500 MG tablet Take 1 tablet (500 mg total) by mouth 2 (two) times daily. 60 tablet 2   aspirin  81 MG tablet Take 81 mg by mouth at bedtime.      atorvastatin  (LIPITOR) 40 MG tablet Take 2 tablets (80 mg total) by mouth daily. 90 tablet 3    Blood Glucose Monitoring Suppl (TRUE METRIX METER) w/Device KIT Use to check blood sugar up to twice daily. 1 kit 0   buPROPion  (WELLBUTRIN  XL) 150 MG 24 hr tablet Take 1 tablet (150 mg total) by mouth every morning. 30 tablet 2   ergocalciferol  (VITAMIN D2) 1.25 MG (50000 UT) capsule Take 1 capsule by mouth once a week. 4 capsule 2   escitalopram  (LEXAPRO ) 20 MG tablet Take 1 tablet (20 mg total) by mouth daily. 90 tablet 3   Glucose Blood (BLOOD GLUCOSE TEST STRIPS) STRP Use to check blood sugar up to twice daily. 100 strip 3   insulin  glargine-yfgn (SEMGLEE ) 100 UNIT/ML Pen Inject 20 Units into the skin daily. May increase up to 30 units daily  if instructed by your provider. 9 mL 11   Lancet Device MISC Use to check blood sugar up to twice daily. May substitute to any manufacturer covered by patient's insurance. 1 each 0   metFORMIN  (GLUCOPHAGE -XR) 500 MG 24 hr tablet Take 2 tablets (1,000 mg total) by mouth 2 (two) times daily with a meal. 360 tablet 1   Multiple Vitamin (MULTIVITAMIN) tablet Take 1 tablet by mouth daily with breakfast.     Na Sulfate-K Sulfate-Mg Sulfate concentrate (SUPREP BOWEL PREP KIT) 17.5-3.13-1.6 GM/177ML SOLN Take 1 kit (354 mLs total) by mouth as directed. 354 mL 0   omeprazole  (PRILOSEC) 20 MG capsule Take 1 capsule (20 mg total) by mouth daily. 30 capsule 3   traZODone  (DESYREL ) 150 MG tablet Take 1 tablet (150 mg total) by mouth at bedtime. 90 tablet 2   valsartan -hydrochlorothiazide  (DIOVAN -HCT) 80-12.5 MG tablet Take 1 tablet by mouth daily. 90 tablet 3   zinc  sulfate, 50mg  elemental zinc , 220 (50 Zn) MG capsule Take 1 capsule (220 mg total) by mouth daily. 60 capsule 2   Continuous Glucose Sensor (FREESTYLE LIBRE 3 PLUS SENSOR) MISC Change sensor every 15 days. (Patient not taking: Reported on 10/06/2024) 6 each 2   insulin  glargine (LANTUS ) 100 UNIT/ML Solostar Pen Inject 20 Units into the skin daily. May increase up to 30 units daily if instructed by your  provider. (Patient not taking: Reported on 10/13/2024) 9 mL 11   Insulin  Pen Needle (PEN NEEDLES) 32G X 4 MM MISC Use as directed to inject insulin  once daily. (Patient not taking: No sig reported) 100 each 3   meclizine  (ANTIVERT ) 25 MG tablet Take 1 tablet (25 mg total) by mouth 3 (three) times daily as needed for dizziness. (Patient not taking: No sig reported) 30 tablet 0   medroxyPROGESTERone  Acetate 150 MG/ML SUSY Inject 1 mL (150 mg total) into the muscle every 3 (three) months. 1 mL 2   Multiple Vitamins-Minerals (HAIR SKIN NAILS) CAPS Take 2 capsules by mouth at bedtime. (Patient not taking: No sig reported)     pregabalin  (LYRICA ) 150 MG capsule Take 1 capsule (150 mg total) by mouth 2 (two) times daily. (Patient not taking: Reported on 10/06/2024) 60 capsule 1   Semaglutide , 2 MG/DOSE, (OZEMPIC , 2 MG/DOSE,) 8 MG/3ML SOPN Inject 2 mg as directed once a week. 3 mL 2   triamcinolone  (KENALOG ) 0.025 % ointment Apply 1 Application topically 2 (two) times daily. 30 g 0   triamcinolone  (KENALOG ) 0.025 % ointment Apply 1 Application topically 2 (two) times daily. (Patient not taking: No sig reported) 454 g 0   Current Facility-Administered Medications  Medication Dose Route Frequency Provider Last Rate Last Admin   0.9 %  sodium chloride  infusion  500 mL Intravenous Once Teng Decou, Inocente HERO, MD       testosterone  cypionate (DEPOTESTOSTERONE CYPIONATE) injection 150 mg  150 mg Intramuscular Q90 days Nichols, Tonya S, NP   150 mg at 05/04/24 9165    Allergies as of 10/13/2024   (No Known Allergies)    Family History  Problem Relation Age of Onset   Hypertension Mother    Heart failure Mother    Fibroids Mother    Breast cancer Sister    Fibroids Sister    Fibroids Maternal Grandmother    Diabetes type II Paternal Grandmother     Social History   Socioeconomic History   Marital status: Married    Spouse name: Not on file   Number of children: Not on  file   Years of education: Not on  file   Highest education level: Not on file  Occupational History   Occupation: CNA  Tobacco Use   Smoking status: Never   Smokeless tobacco: Never  Vaping Use   Vaping status: Never Used  Substance and Sexual Activity   Alcohol use: No   Drug use: No   Sexual activity: Not Currently    Birth control/protection: None  Other Topics Concern   Not on file  Social History Narrative   Not on file   Social Drivers of Health   Financial Resource Strain: Not on file  Food Insecurity: No Food Insecurity (06/26/2024)   Hunger Vital Sign    Worried About Running Out of Food in the Last Year: Never true    Ran Out of Food in the Last Year: Never true  Transportation Needs: No Transportation Needs (06/26/2024)   PRAPARE - Administrator, Civil Service (Medical): No    Lack of Transportation (Non-Medical): No  Physical Activity: Not on file  Stress: Not on file  Social Connections: Unknown (04/13/2022)   Received from [facility]   Social Network    Social Network: Not on file  Intimate Partner Violence: Not At Risk (06/26/2024)   Humiliation, Afraid, Rape, and Kick questionnaire    Fear of Current or Ex-Partner: No    Emotionally Abused: No    Physically Abused: No    Sexually Abused: No    Review of Systems:  All other review of systems negative except as mentioned in the HPI.  Physical Exam: Vital signs BP (!) 159/96   Pulse 94   Temp 98.4 F (36.9 C) (Temporal)   Ht 6' (1.829 m)   Wt 252 lb (114.3 kg)   SpO2 98%   BMI 34.18 kg/m   General:   Alert,  Well-developed, well-nourished, pleasant and cooperative in NAD Airway:  Mallampati 3 Lungs:  Clear throughout to auscultation.   Heart:  Regular rate and rhythm; no murmurs, clicks, rubs,  or gallops. Abdomen:  Soft, nontender and nondistended. Normal bowel sounds.   Neuro/Psych:  Normal mood and affect. A and O x 3  Inocente Hausen, MD [facility]

## 2024-10-13 ENCOUNTER — Ambulatory Visit (AMBULATORY_SURGERY_CENTER): Admitting: Pediatrics

## 2024-10-13 ENCOUNTER — Encounter: Payer: Self-pay | Admitting: Pediatrics

## 2024-10-13 VITALS — BP 120/81 | HR 93 | Temp 98.4°F | Resp 12 | Ht 72.0 in | Wt 252.0 lb

## 2024-10-13 DIAGNOSIS — K648 Other hemorrhoids: Secondary | ICD-10-CM | POA: Diagnosis not present

## 2024-10-13 DIAGNOSIS — Z1211 Encounter for screening for malignant neoplasm of colon: Secondary | ICD-10-CM | POA: Diagnosis present

## 2024-10-13 DIAGNOSIS — Z860101 Personal history of adenomatous and serrated colon polyps: Secondary | ICD-10-CM

## 2024-10-13 MED ORDER — SODIUM CHLORIDE 0.9 % IV SOLN: 500.0000 mL | Freq: Once | INTRAVENOUS | Status: DC

## 2024-10-13 NOTE — Patient Instructions (Signed)
 Repeat colonoscopy in 5 years for surveillance. Handout provided on hemorrhoids.   YOU HAD AN ENDOSCOPIC PROCEDURE TODAY AT THE [facility]:   Refer to the procedure report that was given to you for any specific questions about what was found during the examination.  If the procedure report does not answer your questions, please call your gastroenterologist to clarify.  If you requested that your care partner not be given the details of your procedure findings, then the procedure report has been included in a sealed envelope for you to review at your convenience later.  YOU SHOULD EXPECT: Some feelings of bloating in the abdomen. Passage of more gas than usual.  Walking can help get rid of the air that was put into your GI tract during the procedure and reduce the bloating. If you had a lower endoscopy (such as a colonoscopy or flexible sigmoidoscopy) you may notice spotting of blood in your stool or on the toilet paper. If you underwent a bowel prep for your procedure, you may not have a normal bowel movement for a few days.  Please Note:  You might notice some irritation and congestion in your nose or some drainage.  This is from the oxygen used during your procedure.  There is no need for concern and it should clear up in a day or so.  SYMPTOMS TO REPORT IMMEDIATELY:  Following lower endoscopy (colonoscopy or flexible sigmoidoscopy):  Excessive amounts of blood in the stool  Significant tenderness or worsening of abdominal pains  Swelling of the abdomen that is new, acute  Fever of 100F or higher  For urgent or emergent issues, a gastroenterologist can be reached at any hour by calling [phone]. Do not use MyChart messaging for urgent concerns.    DIET:  We do recommend a small meal at first, but then you may proceed to your regular diet.  Drink plenty of fluids but you should avoid alcoholic beverages for 24 hours.  ACTIVITY:  You should plan to take it easy for the  rest of today and you should NOT DRIVE or use heavy machinery until tomorrow (because of the sedation medicines used during the test).    FOLLOW UP: Our staff will call the number listed on your records the next business day following your procedure.  We will call around 7:15- 8:00 am to check on you and address any questions or concerns that you may have regarding the information given to you following your procedure. If we do not reach you, we will leave a message.     If any biopsies were taken you will be contacted by phone or by letter within the next 1-3 weeks.  Please call us  at [phone] if you have not heard about the biopsies in 3 weeks.    SIGNATURES/CONFIDENTIALITY: You and/or your care partner have signed paperwork which will be entered into your electronic medical record.  These signatures attest to the fact that that the information above on your After Visit Summary has been reviewed and is understood.  Full responsibility of the confidentiality of this discharge information lies with you and/or your care-partner.

## 2024-10-13 NOTE — Progress Notes (Signed)
 Vss nad trans to pacu

## 2024-10-13 NOTE — Op Note (Signed)
 [facility] Patient Name: Erica [name] Procedure Date: 10/13/2024 3:34 PM MRN: [id number] Endoscopist: Inocente Hausen , MD, [id number] Age: 53 Referring MD:  Date of Birth: 1971/07/12 Gender: Female Account #: [id number] Procedure:                Colonoscopy Indications:              High risk colon cancer surveillance: Personal                            history of adenoma (10 mm or greater in size), High                            risk colon cancer surveillance: Personal history of                            adenoma with villous component, Last colonoscopy:                            2022; History of 14 mm tubulovillous adenoma                            resected in the cecum in 2022 Medicines:                Monitored Anesthesia Care Procedure:                Pre-Anesthesia Assessment:                           - Prior to the procedure, a History and Physical                            was performed, and patient medications and                            allergies were reviewed. The patient's tolerance of                            previous anesthesia was also reviewed. The risks                            and benefits of the procedure and the sedation                            options and risks were discussed with the patient.                            All questions were answered, and informed consent                            was obtained. Prior Anticoagulants: The patient has                            taken no anticoagulant or antiplatelet agents. ASA  Grade Assessment: III - A patient with severe                            systemic disease. After reviewing the risks and                            benefits, the patient was deemed in satisfactory                            condition to undergo the procedure.                           After obtaining informed consent, the colonoscope                            was passed under direct vision.  Throughout the                            procedure, the patient's blood pressure, pulse, and                            oxygen saturations were monitored continuously. The                            Olympus Scope [id number] was introduced through the                            anus and advanced to the cecum, identified by                            appendiceal orifice and ileocecal valve. The                            colonoscopy was performed without difficulty. The                            patient tolerated the procedure well. The quality                            of the bowel preparation was good. The ileocecal                            valve, appendiceal orifice, and rectum were                            photographed. Scope In: 3:46:38 PM Scope Out: 3:59:26 PM Scope Withdrawal Time: 0 hours 9 minutes 52 seconds  Total Procedure Duration: 0 hours 12 minutes 48 seconds  Findings:                 The perianal and digital rectal examinations were                            normal. Pertinent negatives include normal  sphincter tone and no palpable rectal lesions.                           The colon (entire examined portion) appeared                            normal. Careful inspection of the cecum did not                            show any evidence of residual polyp tissue. No                            polyps were seen throughout the colon.                           Internal hemorrhoids were found during retroflexion. Complications:            No immediate complications. Estimated blood loss:                            None. Estimated Blood Loss:     Estimated blood loss: none. Impression:               - The entire examined colon is normal. Careful                            inspection of the cecum did not show any evidence                            of residual polyp tissue. No polyps were seen                            throughout the colon.                            - Internal hemorrhoids.                           - No specimens collected. Recommendation:           - Discharge patient to home (ambulatory).                           - Repeat colonoscopy in 5 years for surveillance                            given previous history of advanced adenoma.                           - The findings and recommendations were discussed                            with the patient's family.                           - Patient has a contact number available for  emergencies. The signs and symptoms of potential                            delayed complications were discussed with the                            patient. Return to normal activities tomorrow.                            Written discharge instructions were provided to the                            patient. Inocente Hausen, MD 10/13/2024 4:03:40 PM This report has been signed electronically.

## 2024-10-13 NOTE — Progress Notes (Signed)
 Pt's states no medical or surgical changes since previsit or office visit.

## 2024-10-16 ENCOUNTER — Encounter: Payer: Self-pay | Admitting: Radiology

## 2024-10-16 ENCOUNTER — Other Ambulatory Visit (HOSPITAL_BASED_OUTPATIENT_CLINIC_OR_DEPARTMENT_OTHER): Payer: Self-pay

## 2024-10-16 ENCOUNTER — Telehealth: Payer: Self-pay

## 2024-10-16 NOTE — Telephone Encounter (Signed)
  Follow up Call-     10/13/2024    3:11 PM  Call back number  Post procedure Call Back phone  # [phone]  Permission to leave phone message Yes     Patient questions:  Do you have a fever, pain , or abdominal swelling? No. Pain Score  0 *  Have you tolerated food without any problems? Yes.    Have you been able to return to your normal activities? Yes.    Do you have any questions about your discharge instructions: Diet   No. Medications  No. Follow up visit  No.  Do you have questions or concerns about your Care? No.  Actions: * If pain score is 4 or above: No action needed, pain <4.

## 2024-10-17 ENCOUNTER — Other Ambulatory Visit (HOSPITAL_BASED_OUTPATIENT_CLINIC_OR_DEPARTMENT_OTHER): Payer: Self-pay

## 2024-10-18 ENCOUNTER — Ambulatory Visit: Payer: Self-pay | Admitting: Nurse Practitioner

## 2024-10-23 ENCOUNTER — Other Ambulatory Visit: Payer: Self-pay | Admitting: Nurse Practitioner

## 2024-10-23 ENCOUNTER — Other Ambulatory Visit: Payer: Self-pay

## 2024-10-23 ENCOUNTER — Other Ambulatory Visit (HOSPITAL_BASED_OUTPATIENT_CLINIC_OR_DEPARTMENT_OTHER): Payer: Self-pay

## 2024-10-23 MED ORDER — ESCITALOPRAM OXALATE 20 MG PO TABS
20.0000 mg | ORAL_TABLET | Freq: Every day | ORAL | 3 refills | Status: AC
  Filled 2024-10-23: qty 30, 30d supply, fill #0
  Filled 2024-11-17: qty 30, 30d supply, fill #1
  Filled 2024-12-22: qty 30, 30d supply, fill #2

## 2024-10-23 MED ORDER — ASCORBIC ACID 500 MG PO TABS
500.0000 mg | ORAL_TABLET | Freq: Two times a day (BID) | ORAL | 2 refills | Status: DC
  Filled 2024-10-23: qty 60, 30d supply, fill #0
  Filled 2024-11-17: qty 60, 30d supply, fill #1
  Filled 2024-12-22: qty 60, 30d supply, fill #2

## 2024-10-24 ENCOUNTER — Ambulatory Visit (INDEPENDENT_AMBULATORY_CARE_PROVIDER_SITE_OTHER): Payer: Self-pay

## 2024-10-24 ENCOUNTER — Other Ambulatory Visit: Payer: Self-pay

## 2024-10-24 VITALS — Ht 72.0 in | Wt 252.0 lb

## 2024-10-24 DIAGNOSIS — Z789 Other specified health status: Secondary | ICD-10-CM | POA: Diagnosis not present

## 2024-10-24 MED ORDER — MEDROXYPROGESTERONE ACETATE 150 MG/ML IM SUSP
150.0000 mg | Freq: Once | INTRAMUSCULAR | Status: AC
  Administered 2024-10-24: 150 mg via INTRAMUSCULAR

## 2024-10-24 NOTE — Progress Notes (Signed)
 Pt was given depo injections on right side. Pt was on time and will return in 3 months .   Suzen Shove   CMA II

## 2024-10-26 ENCOUNTER — Other Ambulatory Visit: Payer: Self-pay

## 2024-10-30 ENCOUNTER — Other Ambulatory Visit: Payer: Self-pay

## 2024-10-30 ENCOUNTER — Other Ambulatory Visit (HOSPITAL_BASED_OUTPATIENT_CLINIC_OR_DEPARTMENT_OTHER): Payer: Self-pay

## 2024-10-31 ENCOUNTER — Other Ambulatory Visit: Payer: Self-pay

## 2024-10-31 ENCOUNTER — Other Ambulatory Visit (HOSPITAL_BASED_OUTPATIENT_CLINIC_OR_DEPARTMENT_OTHER): Payer: Self-pay

## 2024-10-31 ENCOUNTER — Other Ambulatory Visit: Payer: Self-pay | Admitting: Nurse Practitioner

## 2024-10-31 DIAGNOSIS — R21 Rash and other nonspecific skin eruption: Secondary | ICD-10-CM

## 2024-10-31 NOTE — Telephone Encounter (Signed)
 Please advise [facility]

## 2024-11-01 ENCOUNTER — Other Ambulatory Visit: Payer: Self-pay

## 2024-11-01 ENCOUNTER — Other Ambulatory Visit (HOSPITAL_BASED_OUTPATIENT_CLINIC_OR_DEPARTMENT_OTHER): Payer: Self-pay

## 2024-11-01 MED ORDER — TRIAMCINOLONE ACETONIDE 0.025 % EX OINT
1.0000 | TOPICAL_OINTMENT | Freq: Two times a day (BID) | CUTANEOUS | 0 refills | Status: DC
  Filled 2024-11-01: qty 454, 30d supply, fill #0

## 2024-11-02 ENCOUNTER — Other Ambulatory Visit (HOSPITAL_BASED_OUTPATIENT_CLINIC_OR_DEPARTMENT_OTHER): Payer: Self-pay

## 2024-11-03 ENCOUNTER — Other Ambulatory Visit (HOSPITAL_BASED_OUTPATIENT_CLINIC_OR_DEPARTMENT_OTHER): Payer: Self-pay

## 2024-11-04 ENCOUNTER — Other Ambulatory Visit (HOSPITAL_BASED_OUTPATIENT_CLINIC_OR_DEPARTMENT_OTHER): Payer: Self-pay

## 2024-11-06 ENCOUNTER — Other Ambulatory Visit (HOSPITAL_BASED_OUTPATIENT_CLINIC_OR_DEPARTMENT_OTHER): Payer: Self-pay

## 2024-11-07 ENCOUNTER — Other Ambulatory Visit: Payer: Self-pay

## 2024-11-07 ENCOUNTER — Other Ambulatory Visit (HOSPITAL_BASED_OUTPATIENT_CLINIC_OR_DEPARTMENT_OTHER): Payer: Self-pay

## 2024-11-07 ENCOUNTER — Other Ambulatory Visit: Payer: Self-pay | Admitting: Nurse Practitioner

## 2024-11-07 DIAGNOSIS — F332 Major depressive disorder, recurrent severe without psychotic features: Secondary | ICD-10-CM

## 2024-11-07 DIAGNOSIS — E559 Vitamin D deficiency, unspecified: Secondary | ICD-10-CM

## 2024-11-07 NOTE — Telephone Encounter (Signed)
 Please advise [facility]

## 2024-11-11 ENCOUNTER — Other Ambulatory Visit (HOSPITAL_BASED_OUTPATIENT_CLINIC_OR_DEPARTMENT_OTHER): Payer: Self-pay

## 2024-11-11 MED ORDER — ERGOCALCIFEROL 1.25 MG (50000 UT) PO CAPS
1.0000 | ORAL_CAPSULE | ORAL | 2 refills | Status: AC
  Filled 2024-11-11 – 2024-11-17 (×2): qty 4, 28d supply, fill #0

## 2024-11-11 MED ORDER — BUPROPION HCL ER (XL) 150 MG PO TB24
150.0000 mg | ORAL_TABLET | Freq: Every morning | ORAL | 2 refills | Status: AC
  Filled 2024-11-11 – 2024-11-17 (×3): qty 30, 30d supply, fill #0
  Filled 2024-12-22: qty 30, 30d supply, fill #1
  Filled ????-??-??: fill #0

## 2024-11-13 ENCOUNTER — Other Ambulatory Visit (HOSPITAL_BASED_OUTPATIENT_CLINIC_OR_DEPARTMENT_OTHER): Payer: Self-pay

## 2024-11-15 ENCOUNTER — Other Ambulatory Visit (HOSPITAL_BASED_OUTPATIENT_CLINIC_OR_DEPARTMENT_OTHER): Payer: Self-pay

## 2024-11-16 ENCOUNTER — Other Ambulatory Visit (HOSPITAL_BASED_OUTPATIENT_CLINIC_OR_DEPARTMENT_OTHER): Payer: Self-pay

## 2024-11-17 ENCOUNTER — Other Ambulatory Visit (HOSPITAL_BASED_OUTPATIENT_CLINIC_OR_DEPARTMENT_OTHER): Payer: Self-pay

## 2024-11-17 ENCOUNTER — Other Ambulatory Visit: Payer: Self-pay

## 2024-11-17 ENCOUNTER — Telehealth: Payer: Self-pay

## 2024-11-17 NOTE — Progress Notes (Signed)
 Attempted to contact patient for scheduled appointment for medication management. Left HIPAA compliant message for patient to return my call at their convenience.   Lorain Baseman, PharmD [facility] [phone]

## 2024-11-17 NOTE — Progress Notes (Unsigned)
 11/17/2024 Name: Erica [name] MRN: [id number] DOB: 09/11/1971  No chief complaint on file.   Erica [name] is a 53 y.o. year old female who presented for a telephone visit.   They were referred to the pharmacist by their PCP for assistance in managing diabetes.  PMH includes HTN, T2DM, HLD, depression.   Subjective: Patient was last seen by PCP, Erica Borer, NP, on 07/18/24. At last visit, A1C continued to be elevated at 11.3%. Patient reported not taking her medications as prescribed. She was recently hospitalized in June 2025 for cellulitis and she is now following with ID. She was discharged from the hospital on basal insulin  + Humalog  per sliding scale, in addition to her Ozempic  and metformin . Patient was outreached be pharmacy via telephone on 07/26/24. She reported occasionally missing doses of her medications, but glycemic control was improved with TIR of 69% per her [facility] report. She was instructed to focus on adherence to insulin  glargine, Ozempic , and metformin  and hold off on using Humalog  TID with meals per sliding scale (since she had not been using at all over the past few weeks). She was seen in office with PCP on 08/16/24. She reported being out of Semglee  at that time and was not feeling well. It was dispensed for a 30ds on 08/17/24. At pharmacy call on 08/25/24, we updated her CGM to the correct order (FL3+).   Today, patient reports doing well.She has one insulin  pen left, but has had difficulty getting refills at the pharmacy (Semglee  on backorder). She has not been wearing CGM but now has correct app downloaded and has one sensor remaining that she plans to connect soon.    Care Team: Primary Care Provider: Borer Erica RAMAN, NP ; Next Scheduled Visit: 10/18/24  Medication Access/Adherence  Current Pharmacy:  [facility] [address] [place] [address] Phone: [phone] Fax: [phone]  [facility]  [phone] [place], [place] - [address] AT [facility] [address] [place] [address] Phone: [phone] Fax: [phone]   Patient reports affordability concerns with their medications: No  Patient reports access/transportation concerns to their pharmacy: No  Patient reports adherence concerns with their medications:  Yes  - history of skipping doses   Diabetes:  Current medications: insulin  glargine (Semglee ) 20 units daily, metformin  XR 1000 mg BID with meals, Ozempic  2 mg weekly (Fridays) (she is holding Ozempic  for one week starting today due to upcoming colonoscopy).  Current glucose readings:  Freestyle Libre 3 CGM - $15/1 mo supply - reports this will be affordable for her  Last available download from 08/15/24 - no more recent data available      8/27 to 08/15/24: was out of Semglee   Patient denies hyperglycemic symptoms including polyuria, polydipsia, polyphagia, nocturia, neuropathy, blurred vision.  Current meal patterns: did not discuss at length today. Reports her appetite is variable and she does not eat at consistent times. She is eating 1-2 meals per day. Sometimes eats a bowl of cereal before bed.  Current physical activity: did not discuss today  Current medication access support: none  Hypertension:  Current medications: valsartan -hydrochlorothiazide  80-12.5 mg daily  Patient has anautomated, upper arm home BP cuff - not currently checking  Patient reports hypotensive s/sx including dizziness, lightheadedness - occasionally when she is getting out of bed in the morning   Hyperlipidemia/ASCVD Risk Reduction  Current lipid lowering medications: atorvastatin  40 mg daily  Clinical ASCVD: No  The 10-year ASCVD risk score (Arnett DK, et al., 2019) is: 12.4%   Values used to calculate the score:     Age: 69 years     Clincally relevant sex: Female     Is Non-Hispanic African American: Yes     Diabetic: Yes      Tobacco smoker: No     Systolic Blood Pressure: 120 mmHg     Is BP treated: Yes     HDL Cholesterol: 32 mg/dL     Total Cholesterol: 183 mg/dL   Objective:  BP Readings from Last 3 Encounters:  10/13/24 120/81  09/22/24 122/76  09/08/24 (!) 149/82    Lab Results  Component Value Date   HGBA1C 11.3 (H) 06/26/2024   HGBA1C 11.1 (A) 02/17/2024   HGBA1C 11.4 (H) 02/07/2024       Latest Ref Rng & Units 08/16/2024    2:02 PM 07/10/2024    3:33 PM 06/29/2024    4:45 AM  BMP  Glucose 70 - 99 mg/dL 739  806  806   BUN 6 - 24 mg/dL 8  6  <5   Creatinine 9.42 - 1.00 mg/dL 9.24  9.33  9.38   BUN/Creat Ratio 9 - 23 11  9     Sodium 134 - 144 mmol/L 137  137  138   Potassium 3.5 - 5.2 mmol/L 4.1  4.2  3.8   Chloride 96 - 106 mmol/L 98  101  105   CO2 20 - 29 mmol/L 21  22  24    Calcium  8.7 - 10.2 mg/dL 9.7  9.6  8.7     Lab Results  Component Value Date   CHOL 183 02/22/2024   HDL 32 (L) 02/22/2024   LDLCALC 96 02/22/2024   TRIG 329 (H) 02/22/2024   CHOLHDL 5.7 (H) 02/22/2024    Medications Reviewed Today   Medications were not reviewed in this encounter       Assessment/Plan:   Diabetes: - Currently uncontrolled with most recent A1C of 11.3% above goal <7% in the setting of medication nonadherence. However, BG are improved when she is adherent to her insulin  per AGP report, though there is no recent data to review. Encouraged her to reapply CGM. She reports she is tolerating her other medications well. Encouraged her to reach out if BG are uncontrolled while she is holding Ozempic  for colonoscopy, as we can increase her dose of insulin .  - Last UACR August 2025 - 24 mg/g - Reviewed long term cardiovascular and renal outcomes of uncontrolled blood sugar - Reviewed goal A1c, goal fasting, and goal 2 hour post prandial glucose - Reviewed hypoglycemia management plan and the rule of 15 - Recommend to continue Ozempic  2 mg weekly - Recommend to continue insulin  glargine 20  units daily - Requested fill from [facility] (sent Lantus  solostar as requested) - Recommend to continue metformin  XR 1000 mg BID - Recommend to check glucose continuously using FL3 CGM.  - Next A1C due now    Hypertension: - Currently controlled with clinic BP consistently below goal less than 130/80. Patient is having possible s/sx of hypotension with dizziness upon waking, but she is not currently monitoring. She also reports occasionally skipping meals, which could lead to dizziness. Encouraged her to monitor at home to evaluate for low BP < 100/60 mmHg with symptoms. - Reviewed long term cardiovascular and renal outcomes of uncontrolled blood pressure - Reviewed appropriate blood pressure monitoring technique and reviewed goal blood pressure.  Recommended to check home blood pressure and heart rate  once daily and keep a log to bring to upcoming appointments - Recommend to continue valsartan -hydrochlorothiazide  80-12.5 mg daily     Hyperlipidemia/ASCVD Risk Reduction: - Currently uncontrolled with most recent LDL-C of 96 mg/dL above goal < 70 mg/dL given U7IF + comorbidities. TG also elevated > 300 mg/dL, likely in the setting of poor glycemic control. Expect improvement if she is able to remain adherent to her medications. High intensity statin indicated. - Reviewed long term complications of uncontrolled cholesterol - Recommend to continue atorvastatin  40 mg daily   Patient verbalized understanding of treatment plan.   Follow Up Plan:  Pharmacist 11/17/24 PCP clinic visit 10/18/24   Lorain Baseman, PharmD [facility] [phone]

## 2024-11-24 ENCOUNTER — Telehealth: Payer: Self-pay | Admitting: Pharmacy Technician

## 2024-11-24 ENCOUNTER — Encounter (HOSPITAL_COMMUNITY): Payer: Self-pay | Admitting: Pharmacy Technician

## 2024-11-24 NOTE — Progress Notes (Addendum)
° °  11/24/2024  Patient ID: Erica [name], female   DOB: 12/16/1970, 53 y.o.   MRN: [id number]  Patient engaged with clinical pharmacist for management of diabetes on 10/06/2024. Outreach by [employer] technician was requested.   Outreached patient to discuss diabetes medication management. Spoke to patient briefly who informed this was not a good time. She requested a call back between 1:30-3:30 on any given day.  ADDENDUM 2:30pm Attempted to reach patient back between the time frame she gave this morning. Unfortunately, she did not answer the phone. HIPAA compliant voicemail left requesting a return call.  Elyon Zoll, CPhT [facility] Office: [phone] Email: [email or web address]

## 2024-11-24 NOTE — Patient Outreach (Signed)
 Erroneous Encounter.  Enis Riecke, CPhT [facility] Office: [phone] Email: [email or web address]

## 2024-11-27 ENCOUNTER — Other Ambulatory Visit (HOSPITAL_BASED_OUTPATIENT_CLINIC_OR_DEPARTMENT_OTHER): Payer: Self-pay

## 2024-11-27 ENCOUNTER — Telehealth: Payer: Self-pay | Admitting: Pharmacy Technician

## 2024-11-27 NOTE — Progress Notes (Signed)
° °  11/27/2024 Name: Odetta Forness MRN: [id number] DOB: May 11, 1971  Patient engaged with clinical pharmacist for management of diabetes on 10/06/2024. Outreach by [employer] technician was requested.   Outreached patient to discuss diabetes and medication access medication management. Left voicemail for patient to return my call at their convenience.    Fanny Agan, CPhT [facility] Office: [phone] Email: [email or web address]

## 2024-11-28 ENCOUNTER — Other Ambulatory Visit (HOSPITAL_BASED_OUTPATIENT_CLINIC_OR_DEPARTMENT_OTHER): Payer: Self-pay

## 2024-11-28 ENCOUNTER — Ambulatory Visit: Admitting: Podiatry

## 2024-11-29 ENCOUNTER — Other Ambulatory Visit (HOSPITAL_BASED_OUTPATIENT_CLINIC_OR_DEPARTMENT_OTHER): Payer: Self-pay

## 2024-11-29 ENCOUNTER — Ambulatory Visit (INDEPENDENT_AMBULATORY_CARE_PROVIDER_SITE_OTHER): Admitting: Nurse Practitioner

## 2024-11-29 VITALS — BP 140/83 | HR 104 | Wt 257.2 lb

## 2024-11-29 DIAGNOSIS — H00014 Hordeolum externum left upper eyelid: Secondary | ICD-10-CM | POA: Diagnosis not present

## 2024-11-29 DIAGNOSIS — R197 Diarrhea, unspecified: Secondary | ICD-10-CM

## 2024-11-29 MED ORDER — ERYTHROMYCIN 5 MG/GM OP OINT
1.0000 | TOPICAL_OINTMENT | Freq: Every day | OPHTHALMIC | 0 refills | Status: AC
  Filled 2024-11-29: qty 3.5, 7d supply, fill #0

## 2024-11-29 NOTE — Progress Notes (Signed)
 Subjective   Patient ID: Erica [name], female    DOB: 1971/04/01, 53 y.o.   MRN: [id number]  Chief Complaint  Patient presents with   Stye    Left eye, sore, has been a problem for a week    Diarrhea    Has been a problem for about a week,    Contraception    Has been spotting with using Depo injection, wanted to know if its normal     Referring provider: [name], NP  Erica [name] is a 53 y.o. female with Past Medical History: No date: Arthritis No date: Depression No date: Diabetes mellitus No date: Fatty liver No date: Hernia, abdominal No date: Hypercholesteremia No date: IBS (irritable bowel syndrome)   HPI  Erythromycin  ointment   Allergies[1]  Immunization History  Administered Date(s) Administered   PNEUMOCOCCAL CONJUGATE-20 05/05/2022   PPD Test 02/08/2023   Tdap 02/01/2020    Tobacco History: Tobacco Use History[2] Counseling given: Not Answered   Outpatient Encounter Medications as of 11/29/2024  Medication Sig   ascorbic acid  (VITAMIN C ) 500 MG tablet Take 1 tablet (500 mg total) by mouth 2 (two) times daily.   aspirin  81 MG tablet Take 81 mg by mouth at bedtime.    atorvastatin  (LIPITOR) 40 MG tablet Take 2 tablets (80 mg total) by mouth daily.   Blood Glucose Monitoring Suppl (TRUE METRIX METER) w/Device KIT Use to check blood sugar up to twice daily.   buPROPion  (WELLBUTRIN  XL) 150 MG 24 hr tablet Take 1 tablet (150 mg total) by mouth every morning.   ergocalciferol  (VITAMIN D2) 1.25 MG (50000 UT) capsule Take 1 capsule by mouth once a week.   erythromycin  ophthalmic ointment Place 1 Application into the left eye at bedtime.   escitalopram  (LEXAPRO ) 20 MG tablet Take 1 tablet (20 mg total) by mouth daily.   Glucose Blood (BLOOD GLUCOSE TEST STRIPS) STRP Use to check blood sugar up to twice daily.   insulin  glargine-yfgn (SEMGLEE ) 100 UNIT/ML Pen Inject 20 Units into the skin daily. May increase up to 30 units daily if instructed by  your provider.   Lancet Device MISC Use to check blood sugar up to twice daily. May substitute to any manufacturer covered by patient's insurance.   medroxyPROGESTERone  Acetate 150 MG/ML SUSY Inject 1 mL (150 mg total) into the muscle every 3 (three) months.   metFORMIN  (GLUCOPHAGE -XR) 500 MG 24 hr tablet Take 2 tablets (1,000 mg total) by mouth 2 (two) times daily with a meal.   Multiple Vitamin (MULTIVITAMIN) tablet Take 1 tablet by mouth daily with breakfast.   Na Sulfate-K Sulfate-Mg Sulfate concentrate (SUPREP BOWEL PREP KIT) 17.5-3.13-1.6 GM/177ML SOLN Take 1 kit (354 mLs total) by mouth as directed.   omeprazole  (PRILOSEC) 20 MG capsule Take 1 capsule (20 mg total) by mouth daily.   Semaglutide , 2 MG/DOSE, (OZEMPIC , 2 MG/DOSE,) 8 MG/3ML SOPN Inject 2 mg as directed once a week.   traZODone  (DESYREL ) 150 MG tablet Take 1 tablet (150 mg total) by mouth at bedtime.   triamcinolone  (KENALOG ) 0.025 % ointment Apply 1 Application topically 2 (two) times daily.   triamcinolone  (KENALOG ) 0.025 % ointment Apply 1 Application topically 2 (two) times daily.   valsartan -hydrochlorothiazide  (DIOVAN -HCT) 80-12.5 MG tablet Take 1 tablet by mouth daily.   zinc  sulfate, 50mg  elemental zinc , 220 (50 Zn) MG capsule Take 1 capsule (220 mg total) by mouth daily.   Continuous Glucose Sensor (FREESTYLE LIBRE 3 PLUS SENSOR) MISC Change sensor every  15 days. (Patient not taking: Reported on 11/29/2024)   insulin  glargine (LANTUS ) 100 UNIT/ML Solostar Pen Inject 20 Units into the skin daily. May increase up to 30 units daily if instructed by your provider. (Patient not taking: Reported on 11/29/2024)   Insulin  Pen Needle (PEN NEEDLES) 32G X 4 MM MISC Use as directed to inject insulin  once daily. (Patient not taking: Reported on 11/29/2024)   meclizine  (ANTIVERT ) 25 MG tablet Take 1 tablet (25 mg total) by mouth 3 (three) times daily as needed for dizziness. (Patient not taking: Reported on 11/29/2024)   Multiple  Vitamins-Minerals (HAIR SKIN NAILS) CAPS Take 2 capsules by mouth at bedtime. (Patient not taking: Reported on 11/29/2024)   pregabalin  (LYRICA ) 150 MG capsule Take 1 capsule (150 mg total) by mouth 2 (two) times daily. (Patient not taking: Reported on 11/29/2024)   [DISCONTINUED] amitriptyline (ELAVIL) 50 MG tablet amitriptyline 50 mg tablet  Take 1 tablet(s) every day by oral route as directed for 30 days.   [DISCONTINUED] gabapentin (NEURONTIN) 300 MG capsule Take 300 mg by mouth.   [DISCONTINUED] metFORMIN  (GLUCOPHAGE ) 1000 MG tablet Take 1 tablet (1,000 mg total) by mouth 2 (two) times daily with a meal.   Facility-Administered Encounter Medications as of 11/29/2024  Medication   testosterone  cypionate (DEPOTESTOSTERONE CYPIONATE) injection 150 mg    Review of Systems  Review of Systems  Constitutional: Negative.   HENT: Negative.    Cardiovascular: Negative.   Gastrointestinal: Negative.   Allergic/Immunologic: Negative.   Neurological: Negative.   Psychiatric/Behavioral: Negative.       Objective:   BP (!) 140/83 (BP Location: Left Arm, Patient Position: Sitting, Cuff Size: Large)   Pulse (!) 104   Wt 257 lb 3.2 oz (116.7 kg)   SpO2 99%   BMI 34.88 kg/m   Wt Readings from Last 5 Encounters:  11/29/24 257 lb 3.2 oz (116.7 kg)  10/24/24 252 lb (114.3 kg)  10/13/24 252 lb (114.3 kg)  09/22/24 252 lb (114.3 kg)  09/08/24 256 lb 9.6 oz (116.4 kg)     Physical Exam Vitals and nursing note reviewed.  Constitutional:      General: She is not in acute distress.    Appearance: She is well-developed.  Eyes:     General:        Left eye: Hordeolum present. Cardiovascular:     Rate and Rhythm: Normal rate and regular rhythm.  Pulmonary:     Effort: Pulmonary effort is normal.     Breath sounds: Normal breath sounds.  Neurological:     Mental Status: She is alert and oriented to person, place, and time.       Assessment & Plan:   Hordeolum externum of left  upper eyelid -     Erythromycin ; Place 1 Application into the left eye at bedtime.  Dispense: 3.5 g; Refill: 0  Diarrhea, unspecified type -     Amylase -     Lipase -     CBC -     Comprehensive metabolic panel with GFR     Return if symptoms worsen or fail to improve.   Bascom GORMAN Borer, NP 11/29/2024     [1] No Known Allergies [2]  Social History Tobacco Use  Smoking Status Never  Smokeless Tobacco Never

## 2024-11-30 ENCOUNTER — Other Ambulatory Visit (HOSPITAL_BASED_OUTPATIENT_CLINIC_OR_DEPARTMENT_OTHER): Payer: Self-pay

## 2024-11-30 ENCOUNTER — Ambulatory Visit: Payer: Self-pay | Admitting: Nurse Practitioner

## 2024-11-30 ENCOUNTER — Ambulatory Visit: Admitting: Podiatry

## 2024-11-30 LAB — COMPREHENSIVE METABOLIC PANEL WITH GFR
ALT: 17 IU/L (ref 0–32)
AST: 19 IU/L (ref 0–40)
Albumin: 4.1 g/dL (ref 3.8–4.9)
Alkaline Phosphatase: 95 IU/L (ref 49–135)
BUN/Creatinine Ratio: 14 (ref 9–23)
BUN: 9 mg/dL (ref 6–24)
Bilirubin Total: 0.4 mg/dL (ref 0.0–1.2)
CO2: 20 mmol/L (ref 20–29)
Calcium: 9.5 mg/dL (ref 8.7–10.2)
Chloride: 100 mmol/L (ref 96–106)
Creatinine, Ser: 0.65 mg/dL (ref 0.57–1.00)
Globulin, Total: 2.3 g/dL (ref 1.5–4.5)
Glucose: 247 mg/dL — ABNORMAL HIGH (ref 70–99)
Potassium: 4 mmol/L (ref 3.5–5.2)
Sodium: 136 mmol/L (ref 134–144)
Total Protein: 6.4 g/dL (ref 6.0–8.5)
eGFR: 107 mL/min/1.73 (ref >59)

## 2024-11-30 LAB — LIPASE: Lipase: 49 U/L (ref 14–72)

## 2024-11-30 LAB — CBC
Hematocrit: 39.7 % (ref 34.0–46.6)
Hemoglobin: 13 g/dL (ref 11.1–15.9)
MCH: 28.8 pg (ref 26.6–33.0)
MCHC: 32.7 g/dL (ref 31.5–35.7)
MCV: 88 fL (ref 79–97)
Platelets: 338 x10E3/uL (ref 150–450)
RBC: 4.51 x10E6/uL (ref 3.77–5.28)
RDW: 12.2 % (ref 11.7–15.4)
WBC: 7 x10E3/uL (ref 3.4–10.8)

## 2024-11-30 LAB — AMYLASE: Amylase: 46 U/L (ref 31–110)

## 2024-12-01 ENCOUNTER — Other Ambulatory Visit (HOSPITAL_BASED_OUTPATIENT_CLINIC_OR_DEPARTMENT_OTHER): Payer: Self-pay

## 2024-12-01 ENCOUNTER — Telehealth: Payer: Self-pay | Admitting: Pharmacy Technician

## 2024-12-01 NOTE — Progress Notes (Signed)
 "  12/01/2024 Name: Erica [name] MRN: [id number] DOB: [birth year]  Patient is appearing for a follow-up visit with the population health pharmacy technician. Last engaged with the clinical pharmacist to discuss diabetes on 10/06/2024. Contacted patient today to discuss diabetes.   Plan from last clinical pharmacist appointment:  Diabetes: - Currently uncontrolled with most recent A1C of 11.3% above goal <7% in the setting of medication nonadherence. However, BG are improved when she is adherent to her insulin  per AGP report, though there is no recent data to review. Encouraged her to reapply CGM. She reports she is tolerating her other medications well. Encouraged her to reach out if BG are uncontrolled while she is holding Ozempic  for colonoscopy, as we can increase her dose of insulin .  - Last UACR August 2025 - 24 mg/g - Reviewed long term cardiovascular and renal outcomes of uncontrolled blood sugar - Reviewed goal A1c, goal fasting, and goal 2 hour post prandial glucose - Reviewed hypoglycemia management plan and the rule of 15 - Recommend to continue Ozempic  2 mg weekly - Recommend to continue insulin  glargine 20 units daily - Requested fill from [facility] (sent Lantus  solostar as requested) - Recommend to continue metformin  XR 1000 mg BID - Recommend to check glucose continuously using FL3 CGM.  - Next A1C due now Hypertension: - Currently controlled with clinic BP consistently below goal less than 130/80. Patient is having possible s/sx of hypotension with dizziness upon waking, but she is not currently monitoring. She also reports occasionally skipping meals, which could lead to dizziness. Encouraged her to monitor at home to evaluate for low BP < 100/60 mmHg with symptoms. - Reviewed long term cardiovascular and renal outcomes of uncontrolled blood pressure - Reviewed appropriate blood pressure monitoring technique and reviewed goal blood pressure. Recommended to check  home blood pressure and heart rate  once daily and keep a log to bring to upcoming appointments - Recommend to continue valsartan -hydrochlorothiazide  80-12.5 mg daily  Hyperlipidemia/ASCVD Risk Reduction: - Currently uncontrolled with most recent LDL-C of 96 mg/dL above goal < 70 mg/dL given U7IF + comorbidities. TG also elevated > 300 mg/dL, likely in the setting of poor glycemic control. Expect improvement if she is able to remain adherent to her medications. High intensity statin indicated. - Reviewed long term complications of uncontrolled cholesterol - Recommend to continue atorvastatin  40 mg daily  -Patient verbalized understanding of treatment plan.  -Follow Up Plan:  Pharmacist 11/17/24 PCP clinic visit 10/18/24(copy/paste from last note)   Medication Adherence Barriers Identified:  Patient made recommended medication changes per plan:  Medications were not able to be reviewed by patient this morning as she wast headed into work and was running late. Access issues with any new medication or testing device:  Per Dr Annemarie fill history data, Ozempic  appears due for a refill as it was last filled for 3ml or 28 day supply on 10/30/24. However, was unable to discuss with patient today as noted above. Freestyle Libre sensors were filled for 2 on 12/15/2, Lantus  was filled for 9ml on 11/27/24, Metofrmin XR 500mg  was filled for 120 on 12/5, Atorvastatin  40mg  was filled for 60 on 12/5 and Valsartan /Hydrochlorothiazide  80/12.5mg  was filled for 30 on 12/5.  Patient is checking blood sugars as prescribed: No She is not using CGM to check blood sugar. She informs she had issues with one of the sensors and has not had the time to put on another sensor. She informs at some point she would like  to start using it again but right now is not the best time. Unsure if patient is checking via finger sticks. Attempted to schedule patient in person with PharmD as requested. However, Thursdays were the only day  patient had available to come into the clinic. PharmD not in [facility] on Thursdays so opted for a telephone visit in early February. Patient has Endocrinology appointment scheduled for 02/01/2025.   Medication Adherence Barriers Addressed/Actions Taken:  Reviewed medication changes per plan from last clinical pharmacist note Will discuss medication access concerns with pharmacist Educated patient to contact pharmacy regarding new prescriptions/refills Reviewed instructions for monitoring blood sugars at home and reminded patient to keep a written log to review with pharmacist Reminded patient of date/time of upcoming clinical pharmacist follow up and any upcoming PCP/specialists visits. Patient denies transportation barriers to the appointment. Yes  Next clinical pharmacist appointment is scheduled for: 01/16/2025  Kate Caddy, CPhT [facility] Office: [phone] Email: [email or web address]   "

## 2024-12-04 ENCOUNTER — Other Ambulatory Visit (HOSPITAL_BASED_OUTPATIENT_CLINIC_OR_DEPARTMENT_OTHER): Payer: Self-pay

## 2024-12-05 ENCOUNTER — Ambulatory Visit: Admitting: Physician Assistant

## 2024-12-09 ENCOUNTER — Other Ambulatory Visit (HOSPITAL_BASED_OUTPATIENT_CLINIC_OR_DEPARTMENT_OTHER): Payer: Self-pay

## 2024-12-11 ENCOUNTER — Other Ambulatory Visit (HOSPITAL_BASED_OUTPATIENT_CLINIC_OR_DEPARTMENT_OTHER): Payer: Self-pay

## 2024-12-11 ENCOUNTER — Telehealth: Payer: Self-pay

## 2024-12-11 NOTE — Telephone Encounter (Signed)
 Copied from CRM #[id number]. Topic: Clinical - Medical Advice >> Dec 11, 2024 11:37 AM Myrick T wrote: Reason for CRM: patient called stated she get her depo shot from [facility] but she has been spotting for about 3wks and it has turned into a full cycle. Patient has a GYN appt in a little over a week. She also has a sty on her left eye that comes and goes. Patient is asking what medication can be prescribed to her for both issues.

## 2024-12-12 NOTE — Telephone Encounter (Signed)
 Please advise [facility]

## 2024-12-12 NOTE — Telephone Encounter (Signed)
 Pt was advised [facility]

## 2024-12-13 ENCOUNTER — Ambulatory Visit: Admitting: Podiatry

## 2024-12-15 ENCOUNTER — Other Ambulatory Visit (HOSPITAL_BASED_OUTPATIENT_CLINIC_OR_DEPARTMENT_OTHER): Payer: Self-pay

## 2024-12-18 ENCOUNTER — Other Ambulatory Visit (HOSPITAL_BASED_OUTPATIENT_CLINIC_OR_DEPARTMENT_OTHER): Payer: Self-pay

## 2024-12-21 ENCOUNTER — Other Ambulatory Visit (HOSPITAL_BASED_OUTPATIENT_CLINIC_OR_DEPARTMENT_OTHER): Payer: Self-pay

## 2024-12-22 ENCOUNTER — Other Ambulatory Visit (HOSPITAL_BASED_OUTPATIENT_CLINIC_OR_DEPARTMENT_OTHER): Payer: Self-pay

## 2024-12-22 ENCOUNTER — Other Ambulatory Visit: Payer: Self-pay | Admitting: Nurse Practitioner

## 2024-12-22 ENCOUNTER — Other Ambulatory Visit: Payer: Self-pay

## 2024-12-22 DIAGNOSIS — K219 Gastro-esophageal reflux disease without esophagitis: Secondary | ICD-10-CM

## 2024-12-22 MED ORDER — TRAZODONE HCL 150 MG PO TABS
150.0000 mg | ORAL_TABLET | Freq: Every day | ORAL | 2 refills | Status: AC
  Filled 2024-12-22: qty 30, 30d supply, fill #0

## 2024-12-22 MED ORDER — OMEPRAZOLE 20 MG PO CPDR
20.0000 mg | DELAYED_RELEASE_CAPSULE | Freq: Every day | ORAL | 3 refills | Status: AC
  Filled 2024-12-22: qty 30, 30d supply, fill #0

## 2024-12-22 NOTE — Telephone Encounter (Signed)
 Please advise [facility]

## 2024-12-22 NOTE — Telephone Encounter (Signed)
 omeprazole  (PRILOSEC) 20 MG capsule      traZODone  (DESYREL ) 150 MG tablet

## 2024-12-23 ENCOUNTER — Other Ambulatory Visit (HOSPITAL_BASED_OUTPATIENT_CLINIC_OR_DEPARTMENT_OTHER): Payer: Self-pay

## 2024-12-25 ENCOUNTER — Other Ambulatory Visit: Payer: Self-pay | Admitting: Nurse Practitioner

## 2024-12-25 ENCOUNTER — Other Ambulatory Visit: Payer: Self-pay

## 2024-12-25 ENCOUNTER — Other Ambulatory Visit (HOSPITAL_BASED_OUTPATIENT_CLINIC_OR_DEPARTMENT_OTHER): Payer: Self-pay

## 2024-12-25 DIAGNOSIS — I10 Essential (primary) hypertension: Secondary | ICD-10-CM

## 2024-12-25 MED ORDER — VALSARTAN-HYDROCHLOROTHIAZIDE 80-12.5 MG PO TABS
1.0000 | ORAL_TABLET | Freq: Every day | ORAL | 3 refills | Status: AC
  Filled 2024-12-25: qty 30, 30d supply, fill #0

## 2024-12-25 NOTE — Telephone Encounter (Signed)
valsartan-hydrochlorothiazide (DIOVAN-HCT) 80-12.5 MG tablet

## 2024-12-26 ENCOUNTER — Other Ambulatory Visit (HOSPITAL_BASED_OUTPATIENT_CLINIC_OR_DEPARTMENT_OTHER): Payer: Self-pay

## 2024-12-26 ENCOUNTER — Other Ambulatory Visit (HOSPITAL_COMMUNITY): Payer: Self-pay

## 2024-12-27 ENCOUNTER — Other Ambulatory Visit (HOSPITAL_BASED_OUTPATIENT_CLINIC_OR_DEPARTMENT_OTHER): Payer: Self-pay

## 2025-01-01 ENCOUNTER — Other Ambulatory Visit (HOSPITAL_COMMUNITY): Payer: Self-pay

## 2025-01-02 ENCOUNTER — Other Ambulatory Visit: Payer: Self-pay

## 2025-01-03 ENCOUNTER — Other Ambulatory Visit (HOSPITAL_BASED_OUTPATIENT_CLINIC_OR_DEPARTMENT_OTHER): Payer: Self-pay

## 2025-01-03 ENCOUNTER — Other Ambulatory Visit: Payer: Self-pay | Admitting: Nurse Practitioner

## 2025-01-03 DIAGNOSIS — E1165 Type 2 diabetes mellitus with hyperglycemia: Secondary | ICD-10-CM

## 2025-01-03 MED ORDER — OZEMPIC (2 MG/DOSE) 8 MG/3ML ~~LOC~~ SOPN
2.0000 mg | PEN_INJECTOR | SUBCUTANEOUS | 2 refills | Status: AC
  Filled 2025-01-03: qty 3, 28d supply, fill #0

## 2025-01-04 ENCOUNTER — Ambulatory Visit: Payer: Self-pay | Admitting: Obstetrics

## 2025-01-09 ENCOUNTER — Other Ambulatory Visit: Payer: Self-pay

## 2025-01-15 ENCOUNTER — Ambulatory Visit: Admitting: Obstetrics

## 2025-01-16 ENCOUNTER — Other Ambulatory Visit (HOSPITAL_BASED_OUTPATIENT_CLINIC_OR_DEPARTMENT_OTHER): Payer: Self-pay

## 2025-01-16 ENCOUNTER — Other Ambulatory Visit (INDEPENDENT_AMBULATORY_CARE_PROVIDER_SITE_OTHER): Payer: Self-pay

## 2025-01-16 DIAGNOSIS — E1165 Type 2 diabetes mellitus with hyperglycemia: Secondary | ICD-10-CM

## 2025-01-16 MED ORDER — FREESTYLE LIBRE 3 PLUS SENSOR MISC
3 refills | Status: AC
  Filled 2025-01-16: qty 6, 90d supply, fill #0

## 2025-01-16 MED ORDER — ASCORBIC ACID 500 MG PO TABS
500.0000 mg | ORAL_TABLET | Freq: Every day | ORAL | 3 refills | Status: AC
  Filled 2025-01-16 – 2025-01-18 (×3): qty 30, 30d supply, fill #0

## 2025-01-17 ENCOUNTER — Other Ambulatory Visit (HOSPITAL_BASED_OUTPATIENT_CLINIC_OR_DEPARTMENT_OTHER): Payer: Self-pay

## 2025-01-17 ENCOUNTER — Other Ambulatory Visit: Payer: Self-pay

## 2025-01-18 ENCOUNTER — Ambulatory Visit: Payer: Self-pay

## 2025-01-18 ENCOUNTER — Ambulatory Visit: Payer: Self-pay | Admitting: Internal Medicine

## 2025-01-18 ENCOUNTER — Ambulatory Visit: Admitting: Podiatry

## 2025-01-18 ENCOUNTER — Other Ambulatory Visit (HOSPITAL_BASED_OUTPATIENT_CLINIC_OR_DEPARTMENT_OTHER): Payer: Self-pay

## 2025-01-18 DIAGNOSIS — S93311A Subluxation of tarsal joint of right foot, initial encounter: Secondary | ICD-10-CM

## 2025-01-18 NOTE — Progress Notes (Signed)
 This patient presents to the office with vague pain on the outside top of her right foot.  She says she has no history or injury of her foot.  She denies redness or swelling to her right foot.  No self treatment was provided.  She had surgery for correction of fifth toe left foot.  She presents for evaluation and treatment.    Vascular  Dorsalis pedis and posterior tibial pulses are palpable  B/L.  Capillary return  WNL.  Temperature gradient is  WNL.  Skin turgor  WNL  Sensorium  Senn Weinstein monofilament wire  WNL. Normal tactile sensation.  Nail Exam  Patient has normal nails with no evidence of bacterial or fungal infection.  Orthopedic  Exam  Muscle tone and muscle strength  WNL.  No limitations of motion feet  B/L.  No crepitus or joint effusion noted.  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  Plantar flexed second metatarsal right foot.  No redness swelling or pain palpated on dorsum of right foot.    Skin  No open lesions.  Normal skin texture and turgor.   Cuboid syndrome right foot  HAV  Plantar flexed second right foot.    ROV.  She says she wears orthoses in her work shoes.  Possible neuritis right foot secondary cuboid syndrome.  Powerstep insoles were dispensed.  If pain continues she is to make follow up appointment with one of the podiatric doctors.  [name] DPM

## 2025-01-18 NOTE — Telephone Encounter (Signed)
 Reached out to patient to discuss current cold symptoms. Patient mentioned she is on day 2 of a z-pack that was prescribed to her in the past. Patient scheduled for appointment and care advice given.  FYI Only or Action Required?: FYI only for provider: appointment scheduled on 01/19/25.  Patient was last seen in primary care on 11/29/2024 by [name], NP.  Called Nurse Triage reporting Sinusitis, Sore Throat, and Cough.  Symptoms began several days ago.  Interventions attempted: Prescription medications: Z-pack.  Symptoms are: unchanged.  Triage Disposition: See PCP When Office is Open (Within 3 Days)  Patient/caregiver understands and will follow disposition?: Yes   Reason for CRM: Patient, states not feeling well in general,all over, has sore throat, sinus pressure, cough  Reason for Disposition  Lots of coughing  Answer Assessment - Initial Assessment Questions 1. LOCATION: Where does it hurt?      Facial area 5. NASAL CONGESTION: Is the nose blocked? If Yes, ask: Can you open it or must you breathe through your mouth?     Runny nose, nasal congestion 8. OTHER SYMPTOMS: Do you have any other symptoms? (e.g., sore throat, cough, earache, difficulty breathing)     Mild cough  Protocols used: Sinus Pain or Congestion-A-AH

## 2025-01-18 NOTE — Telephone Encounter (Signed)
 1st attempt to contact patient. Left message to return call via [phone]. Placed in call backs for further attempts to contact.    Copied from CRM [id number]. Topic: Clinical - Medical Advice >> Jan 18, 2025 10:24 AM Victoria B wrote: Reason for CRM: Patient, states not feeling well in general,all over, has sore throat, sinus pressure, cough

## 2025-01-18 NOTE — Telephone Encounter (Signed)
" ° °  Reason for CRM: Patient, states not feeling well in general,all over, has sore throat, sinus pressure, cough  Reason for Disposition  Lots of coughing  Answer Assessment - Initial Assessment Questions 1. LOCATION: Where does it hurt?      Facial area 5. NASAL CONGESTION: Is the nose blocked? If Yes, ask: Can you open it or must you breathe through your mouth?     Runny nose, nasal congestion 8. OTHER SYMPTOMS: Do you have any other symptoms? (e.g., sore throat, cough, earache, difficulty breathing)     Mild cough  Protocols used: Sinus Pain or Congestion-A-AH  "

## 2025-01-19 ENCOUNTER — Other Ambulatory Visit: Payer: Self-pay | Admitting: Nurse Practitioner

## 2025-01-19 ENCOUNTER — Other Ambulatory Visit (HOSPITAL_BASED_OUTPATIENT_CLINIC_OR_DEPARTMENT_OTHER): Payer: Self-pay

## 2025-01-19 ENCOUNTER — Ambulatory Visit: Payer: Self-pay | Admitting: Nurse Practitioner

## 2025-01-19 VITALS — BP 122/68 | HR 107 | Temp 97.1°F | Wt 253.0 lb

## 2025-01-19 DIAGNOSIS — R051 Acute cough: Secondary | ICD-10-CM | POA: Insufficient documentation

## 2025-01-19 DIAGNOSIS — J101 Influenza due to other identified influenza virus with other respiratory manifestations: Secondary | ICD-10-CM | POA: Insufficient documentation

## 2025-01-19 LAB — POCT INFLUENZA A/B
Influenza A, POC: POSITIVE — AB
Influenza B, POC: NEGATIVE

## 2025-01-19 LAB — POC COVID19 BINAXNOW: SARS Coronavirus 2 Ag: NEGATIVE

## 2025-01-19 MED ORDER — OSELTAMIVIR PHOSPHATE 75 MG PO CAPS
75.0000 mg | ORAL_CAPSULE | Freq: Every day | ORAL | 0 refills | Status: DC
  Filled 2025-01-19: qty 5, 5d supply, fill #0

## 2025-01-19 MED ORDER — OSELTAMIVIR PHOSPHATE 75 MG PO CAPS
75.0000 mg | ORAL_CAPSULE | Freq: Two times a day (BID) | ORAL | 0 refills | Status: AC
  Filled 2025-01-19: qty 10, 5d supply, fill #0

## 2025-01-19 MED ORDER — BENZONATATE 100 MG PO CAPS
100.0000 mg | ORAL_CAPSULE | Freq: Three times a day (TID) | ORAL | 0 refills | Status: AC | PRN
  Filled 2025-01-19: qty 20, 7d supply, fill #0

## 2025-01-19 NOTE — Patient Instructions (Signed)
 1. Acute cough (Primary) - POC COVID-19 BinaxNow  2. Influenza A - POCT Influenza A/B - oseltamivir  (TAMIFLU ) 75 MG capsule; Take 1 capsule (75 mg total) by mouth daily.  Dispense: 5 capsule; Refill: 0 - benzonatate  (TESSALON ) 100 MG capsule; Take 1 capsule (100 mg total) by mouth 3 (three) times daily as needed for cough.  Dispense: 20 capsule; Refill: 0    It is important that you exercise regularly at least 30 minutes 5 times a week as tolerated  Think about what you will eat, plan ahead. Choose  clean, green, fresh or frozen over canned, processed or packaged foods which are more sugary, salty and fatty. 70 to 75% of food eaten should be vegetables and fruit. Three meals at set times with snacks allowed between meals, but they must be fruit or vegetables. Aim to eat over a 12 hour period , example 7 am to 7 pm, and STOP after  your last meal of the day. Drink water,generally about 64 ounces per day, no other drink is as healthy. Fruit juice is best enjoyed in a healthy way, by EATING the fruit.  Thanks for choosing [facility] we consider it a privelige to serve you.

## 2025-01-19 NOTE — Assessment & Plan Note (Signed)
 Influenza A with acute cough Positive influenza A test with acute cough and malaise. No fever or headache. Symptoms include fatigue and drainage. - Prescribed Tamiflu  75 mg twice daily for five days. - Prescribed Tessalon  100 mg three times daily as needed for cough. - Advised to stay well hydrated. - Recommended wearing a mask when around others, especially in work settings. - Provided a work note for absence from Wednesday to tomorrow, with return on Monday.

## 2025-01-19 NOTE — Telephone Encounter (Signed)
 Please advise [facility]

## 2025-01-19 NOTE — Progress Notes (Signed)
 "  Acute Office Visit  Subjective:     Patient ID: Erica [name], female    DOB: Jan 22, 1971, 54 y.o.   MRN: [id number]  Chief Complaint  Patient presents with   Sinus Problem   Nasal Congestion   Cough    Sinus Problem Associated symptoms include coughing. Pertinent negatives include no chills, congestion, headaches, shortness of breath or sneezing.  Cough Pertinent negatives include no chest pain, chills, fever, headaches, myalgias, rash, rhinorrhea, shortness of breath or wheezing.    Discussed the use of AI scribe software for clinical note transcription with the patient, who gave verbal consent to proceed.  History of Present Illness Erica [name] is a 54 year old female with diabetes who presents with flu-like symptoms.  She has been feeling unwell for about a week, initially attributing her symptoms to her diabetes. Over the past week, she developed nasal drainage and myalgias. Three days ago, she began experiencing a cough. No fever or headaches. She describes fatigue and has not taken any over-the-counter cough medicine.  She tested positive for the flu and has not received a flu shot this season, although she usually does. She is uncertain if she received it this year due to being preoccupied with caring for her mother.  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 She has taken some days off due to her symptoms.     Assessment & Plan          Review of Systems  Constitutional:  Negative for appetite change, chills, fatigue and fever.  HENT:  Negative for congestion, rhinorrhea and sneezing.   Respiratory:  Positive for cough. Negative for shortness of breath and wheezing.   Cardiovascular:  Negative for chest pain, palpitations and leg swelling.  Gastrointestinal:  Negative for abdominal pain, constipation, nausea and vomiting.  Genitourinary:  Negative for difficulty urinating, dysuria, flank pain and frequency.   Musculoskeletal:  Negative for arthralgias, back pain, joint swelling and myalgias.  Skin:  Negative for color change, pallor, rash and wound.  Neurological:  Negative for dizziness, facial asymmetry, weakness, numbness and headaches.  Psychiatric/Behavioral:  Negative for behavioral problems, confusion, self-injury and suicidal ideas.         Objective:    BP 122/68   Pulse (!) 107   Temp (!) 97.1 F (36.2 C)   Wt 253 lb (114.8 kg)   SpO2 98%   BMI 34.31 kg/m    Physical Exam Vitals reviewed.  Constitutional:      General: She is not in acute distress.    Appearance: Normal appearance. She is not ill-appearing, toxic-appearing or diaphoretic.  HENT:     Nose: No congestion or rhinorrhea.     Mouth/Throat:     Mouth: Mucous membranes are moist.     Pharynx: Oropharynx is clear. No oropharyngeal exudate or posterior oropharyngeal erythema.  Eyes:     General: No scleral icterus.       Right eye: No discharge.        Left eye: No discharge.     Extraocular Movements: Extraocular movements intact.     Conjunctiva/sclera: Conjunctivae normal.  Cardiovascular:     Rate and Rhythm: Normal rate and regular rhythm.     Pulses: Normal pulses.     Heart sounds: Normal heart sounds. No murmur heard. Pulmonary:     Effort: Pulmonary effort is normal. No respiratory distress.     Breath sounds: Normal breath sounds. No stridor. No wheezing,  rhonchi or rales.  Chest:     Chest wall: No tenderness.  Abdominal:     General: There is no distension.     Palpations: Abdomen is soft.     Tenderness: There is no abdominal tenderness. There is no guarding.  Musculoskeletal:        General: No tenderness.     Cervical back: Normal range of motion and neck supple. No rigidity or tenderness.     Right lower leg: No edema.     Left lower leg: No edema.  Lymphadenopathy:     Cervical: No cervical adenopathy.  Skin:    General: Skin is warm and dry.     Capillary Refill: Capillary  refill takes 2 to 3 seconds.  Neurological:     Mental Status: She is alert and oriented to person, place, and time.     Motor: No weakness.     Coordination: Coordination normal.     Gait: Gait normal.  Psychiatric:        Mood and Affect: Mood normal.        Behavior: Behavior normal.        Thought Content: Thought content normal.        Judgment: Judgment normal.     Results for orders placed or performed in visit on 01/19/25  POC COVID-19 BinaxNow  Result Value Ref Range   SARS Coronavirus 2 Ag Negative Negative  POCT Influenza A/B  Result Value Ref Range   Influenza A, POC Positive (A) Negative   Influenza B, POC Negative Negative        Assessment & Plan:   Problem List Items Addressed This Visit       Respiratory   Influenza A   Influenza A with acute cough Positive influenza A test with acute cough and malaise. No fever or headache. Symptoms include fatigue and drainage. - Prescribed Tamiflu  75 mg twice daily for five days. - Prescribed Tessalon  100 mg three times daily as needed for cough. - Advised to stay well hydrated. - Recommended wearing a mask when around others, especially in work settings. - Provided a work note for absence from Wednesday to tomorrow, with return on Monday.      Relevant Medications   oseltamivir  (TAMIFLU ) 75 MG capsule   benzonatate  (TESSALON ) 100 MG capsule   Other Relevant Orders   POCT Influenza A/B (Completed)     Other   Acute cough - Primary   Relevant Orders   POC COVID-19 BinaxNow (Completed)    Meds ordered this encounter  Medications   oseltamivir  (TAMIFLU ) 75 MG capsule    Sig: Take 1 capsule (75 mg total) by mouth daily.    Dispense:  5 capsule    Refill:  0   benzonatate  (TESSALON ) 100 MG capsule    Sig: Take 1 capsule (100 mg total) by mouth 3 (three) times daily as needed for cough.    Dispense:  20 capsule    Refill:  0    No follow-ups on file.  Kyian Obst R Yanisa Goodgame, FNP  "

## 2025-01-22 ENCOUNTER — Ambulatory Visit: Payer: Self-pay

## 2025-01-29 ENCOUNTER — Ambulatory Visit: Admitting: Obstetrics

## 2025-02-01 ENCOUNTER — Ambulatory Visit: Admitting: Endocrinology

## 2025-02-16 ENCOUNTER — Other Ambulatory Visit

## 2025-03-15 ENCOUNTER — Ambulatory Visit: Admitting: Podiatry

## 2025-04-10 ENCOUNTER — Encounter

## 2025-06-20 ENCOUNTER — Ambulatory Visit: Admitting: Physician Assistant
